# Patient Record
Sex: Female | Born: 1970 | Race: White | Hispanic: No | Smoking: Current every day smoker
Health system: Southern US, Community
[De-identification: ages and names within clinical notes are randomized; demographics above are authoritative.]

## PROBLEM LIST (undated history)

## (undated) ENCOUNTER — Ambulatory Visit: Admission: EM

## (undated) DIAGNOSIS — J449 Chronic obstructive pulmonary disease, unspecified: Secondary | ICD-10-CM

## (undated) DIAGNOSIS — K219 Gastro-esophageal reflux disease without esophagitis: Secondary | ICD-10-CM

## (undated) DIAGNOSIS — K5792 Diverticulitis of intestine, part unspecified, without perforation or abscess without bleeding: Secondary | ICD-10-CM

## (undated) DIAGNOSIS — Z9889 Other specified postprocedural states: Secondary | ICD-10-CM

## (undated) DIAGNOSIS — R112 Nausea with vomiting, unspecified: Secondary | ICD-10-CM

## (undated) HISTORY — PX: COLONOSCOPY: SHX174

## (undated) HISTORY — PX: TUBAL LIGATION: SHX77

---

## 2002-11-01 ENCOUNTER — Encounter: Payer: Self-pay | Admitting: Family Medicine

## 2002-11-01 ENCOUNTER — Ambulatory Visit (HOSPITAL_COMMUNITY): Admission: RE | Admit: 2002-11-01 | Discharge: 2002-11-01 | Payer: Self-pay | Admitting: Family Medicine

## 2004-03-03 ENCOUNTER — Inpatient Hospital Stay (HOSPITAL_COMMUNITY): Admission: EM | Admit: 2004-03-03 | Discharge: 2004-03-07 | Payer: Self-pay | Admitting: Emergency Medicine

## 2004-03-09 ENCOUNTER — Ambulatory Visit (HOSPITAL_COMMUNITY): Admission: RE | Admit: 2004-03-09 | Discharge: 2004-03-09 | Payer: Self-pay | Admitting: Family Medicine

## 2004-03-26 ENCOUNTER — Ambulatory Visit (HOSPITAL_COMMUNITY): Admission: RE | Admit: 2004-03-26 | Discharge: 2004-03-26 | Payer: Self-pay | Admitting: Internal Medicine

## 2004-11-22 ENCOUNTER — Ambulatory Visit (HOSPITAL_COMMUNITY): Admission: RE | Admit: 2004-11-22 | Discharge: 2004-11-22 | Payer: Self-pay | Admitting: Family Medicine

## 2007-12-16 ENCOUNTER — Other Ambulatory Visit: Admission: RE | Admit: 2007-12-16 | Discharge: 2007-12-16 | Payer: Self-pay | Admitting: Obstetrics and Gynecology

## 2007-12-20 ENCOUNTER — Ambulatory Visit (HOSPITAL_COMMUNITY): Admission: RE | Admit: 2007-12-20 | Discharge: 2007-12-20 | Payer: Self-pay | Admitting: Obstetrics & Gynecology

## 2010-05-31 ENCOUNTER — Emergency Department (HOSPITAL_COMMUNITY): Admission: EM | Admit: 2010-05-31 | Discharge: 2010-05-31 | Payer: Self-pay | Admitting: Emergency Medicine

## 2010-09-15 ENCOUNTER — Encounter: Payer: Self-pay | Admitting: Family Medicine

## 2010-11-07 LAB — COMPREHENSIVE METABOLIC PANEL
Albumin: 3.8 g/dL (ref 3.5–5.2)
BUN: 4 mg/dL — ABNORMAL LOW (ref 6–23)
CO2: 23 mEq/L (ref 19–32)
Chloride: 107 mEq/L (ref 96–112)
Glucose, Bld: 120 mg/dL — ABNORMAL HIGH (ref 70–99)
Potassium: 3.5 mEq/L (ref 3.5–5.1)
Sodium: 137 mEq/L (ref 135–145)
Total Protein: 6.8 g/dL (ref 6.0–8.3)

## 2010-11-07 LAB — CBC
Hemoglobin: 13.5 g/dL (ref 12.0–15.0)
MCH: 31.9 pg (ref 26.0–34.0)
MCHC: 34.2 g/dL (ref 30.0–36.0)
MCV: 93.2 fL (ref 78.0–100.0)
Platelets: 205 10*3/uL (ref 150–400)

## 2010-11-07 LAB — URINALYSIS, ROUTINE W REFLEX MICROSCOPIC
Ketones, ur: NEGATIVE mg/dL
Leukocytes, UA: NEGATIVE
Specific Gravity, Urine: 1.02 (ref 1.005–1.030)

## 2010-11-07 LAB — DIFFERENTIAL
Eosinophils Absolute: 0.2 10*3/uL (ref 0.0–0.7)
Eosinophils Relative: 2 % (ref 0–5)
Lymphocytes Relative: 18 % (ref 12–46)
Monocytes Relative: 5 % (ref 3–12)
Neutrophils Relative %: 74 % (ref 43–77)

## 2011-03-01 ENCOUNTER — Emergency Department (HOSPITAL_COMMUNITY)
Admission: EM | Admit: 2011-03-01 | Discharge: 2011-03-01 | Discharge status: Against Medical Advice | Payer: BC Managed Care – PPO | Attending: Emergency Medicine | Admitting: Emergency Medicine

## 2011-03-01 ENCOUNTER — Encounter: Payer: Self-pay | Admitting: *Deleted

## 2011-03-01 DIAGNOSIS — R109 Unspecified abdominal pain: Secondary | ICD-10-CM | POA: Insufficient documentation

## 2011-03-01 HISTORY — DX: Diverticulitis of intestine, part unspecified, without perforation or abscess without bleeding: K57.92

## 2011-05-15 ENCOUNTER — Other Ambulatory Visit: Payer: Self-pay | Admitting: Family Medicine

## 2011-05-15 ENCOUNTER — Ambulatory Visit (HOSPITAL_COMMUNITY)
Admission: RE | Admit: 2011-05-15 | Discharge: 2011-05-15 | Disposition: A | Discharge status: Home / Self Care | Payer: BC Managed Care – PPO | Source: Ambulatory Visit | Attending: Family Medicine | Admitting: Family Medicine

## 2011-05-15 DIAGNOSIS — M25551 Pain in right hip: Secondary | ICD-10-CM

## 2011-05-15 DIAGNOSIS — M25559 Pain in unspecified hip: Secondary | ICD-10-CM | POA: Insufficient documentation

## 2011-07-10 ENCOUNTER — Ambulatory Visit: Payer: BC Managed Care – PPO | Admitting: Orthopedic Surgery

## 2012-08-30 ENCOUNTER — Other Ambulatory Visit: Payer: Self-pay | Admitting: Family Medicine

## 2012-08-30 DIAGNOSIS — R109 Unspecified abdominal pain: Secondary | ICD-10-CM

## 2012-09-02 ENCOUNTER — Emergency Department (HOSPITAL_COMMUNITY)
Admission: EM | Admit: 2012-09-02 | Discharge: 2012-09-03 | Disposition: A | Discharge status: Home / Self Care | Payer: BC Managed Care – PPO | Attending: Emergency Medicine | Admitting: Emergency Medicine

## 2012-09-02 ENCOUNTER — Encounter (HOSPITAL_COMMUNITY): Payer: Self-pay | Admitting: *Deleted

## 2012-09-02 ENCOUNTER — Ambulatory Visit (HOSPITAL_COMMUNITY)
Admission: RE | Admit: 2012-09-02 | Discharge: 2012-09-02 | Disposition: A | Discharge status: Home / Self Care | Payer: BC Managed Care – PPO | Source: Ambulatory Visit | Attending: Family Medicine | Admitting: Family Medicine

## 2012-09-02 ENCOUNTER — Other Ambulatory Visit: Payer: Self-pay | Admitting: Family Medicine

## 2012-09-02 DIAGNOSIS — Z8719 Personal history of other diseases of the digestive system: Secondary | ICD-10-CM | POA: Insufficient documentation

## 2012-09-02 DIAGNOSIS — R110 Nausea: Secondary | ICD-10-CM | POA: Insufficient documentation

## 2012-09-02 DIAGNOSIS — R109 Unspecified abdominal pain: Secondary | ICD-10-CM

## 2012-09-02 DIAGNOSIS — R1011 Right upper quadrant pain: Secondary | ICD-10-CM | POA: Insufficient documentation

## 2012-09-02 DIAGNOSIS — F17200 Nicotine dependence, unspecified, uncomplicated: Secondary | ICD-10-CM | POA: Insufficient documentation

## 2012-09-02 DIAGNOSIS — K8020 Calculus of gallbladder without cholecystitis without obstruction: Secondary | ICD-10-CM | POA: Insufficient documentation

## 2012-09-02 DIAGNOSIS — K829 Disease of gallbladder, unspecified: Secondary | ICD-10-CM

## 2012-09-02 MED ORDER — ONDANSETRON HCL 4 MG/2ML IJ SOLN
4.0000 mg | Freq: Once | INTRAMUSCULAR | Status: AC
  Administered 2012-09-03: 4 mg via INTRAVENOUS
  Filled 2012-09-02: qty 2

## 2012-09-02 MED ORDER — HYDROMORPHONE HCL PF 1 MG/ML IJ SOLN
1.0000 mg | Freq: Once | INTRAMUSCULAR | Status: AC
  Administered 2012-09-03: 1 mg via INTRAVENOUS
  Filled 2012-09-02: qty 1

## 2012-09-02 MED ORDER — SODIUM CHLORIDE 0.9 % IV SOLN
Freq: Once | INTRAVENOUS | Status: AC
  Administered 2012-09-03: via INTRAVENOUS

## 2012-09-02 NOTE — ED Provider Notes (Signed)
History   This chart was scribed for [employer]. Colon Branch, MD, by Frederik Pear, ER scribe. The patient was seen in room APA09/APA09 and the patient's care was started at [address].    CSN: [id number]  Arrival date & time 09/02/12  2235   First MD Initiated Contact with Patient 09/02/12 2344      Chief Complaint  Patient presents with  . Abdominal Pain    (Consider location/radiation/quality/duration/timing/severity/associated sxs/prior treatment) HPI  Brandy Hensley is a 42 y.o. female who presents to the Emergency Department complaining of constant, moderate abdominal pain with associated nausea that began around 18:00. She denies any emesis. She states that took Gas-Ex, extra Fish farm manager and Protonix with no relief. She reports that she had a gallbladder study ordered today by Dr. Lubertha South.   Past Medical History  Diagnosis Date  . Diverticulitis     Past Surgical History  Procedure Date  . Tubal ligation     History reviewed. No pertinent family history.  History  Substance Use Topics  . Smoking status: Current Every Day Smoker -- 1.0 packs/day  . Smokeless tobacco: Not on file  . Alcohol Use: No    OB History    Grav Para Term Preterm Abortions TAB SAB Ect Mult Living                  Review of Systems A complete 10 system review of systems was obtained and all systems are negative except as noted in the HPI and PMH.  Allergies  Codeine  Home Medications   Current Outpatient Rx  Name  Route  Sig  Dispense  Refill  . FLEXERIL PO   Oral   Take by mouth.           Marland Kitchen PROTONIX PO   Oral   Take by mouth.             BP 143/75  Pulse 82  Temp 97.5 F (36.4 C) (Oral)  Resp 20  Ht 5\' 8"  (1.727 m)  Wt 230 lb (104.327 kg)  BMI 34.97 kg/m2  SpO2 100%  LMP 08/28/2012  Physical Exam  Nursing note and vitals reviewed. Constitutional: She appears well-developed and well-nourished.  HENT:  Head: Normocephalic and atraumatic.  Eyes: Pupils  are equal, round, and reactive to light.  Neck: Normal range of motion. Neck supple.  Pulmonary/Chest: Effort normal. No respiratory distress.  Abdominal: There is tenderness.       She has focal RUQ tenderness with a positive Murphy's sign, but no rebound or guarding.  Musculoskeletal: Normal range of motion.  Neurological: She is alert.  Skin: Skin is warm and dry.  Psychiatric: She has a normal mood and affect. Thought content normal.    ED Course  Procedures (including critical care time)  DIAGNOSTIC STUDIES: Oxygen Saturation is 100% on room air, normal by my interpretation.    COORDINATION OF CARE:  23:56- Discussed planned course of treatment with the patient, including pain medication and blood work, who is agreeable at this time. Results for orders placed during the hospital encounter of 09/02/12  CBC WITH DIFFERENTIAL      Component Value Range   WBC 11.0 (*) 4.0 - 10.5 K/uL   RBC 4.10  3.87 - 5.11 MIL/uL   Hemoglobin 11.4 (*) 12.0 - 15.0 g/dL   HCT 47.8 (*) 29.5 - 62.1 %   MCV 85.4  78.0 - 100.0 fL   MCH 27.8  26.0 - 34.0 pg  MCHC 32.6  30.0 - 36.0 g/dL   RDW 21.3  08.6 - 57.8 %   Platelets 278  150 - 400 K/uL   Neutrophils Relative 58  43 - 77 %   Neutro Abs 6.4  1.7 - 7.7 K/uL   Lymphocytes Relative 34  12 - 46 %   Lymphs Abs 3.8  0.7 - 4.0 K/uL   Monocytes Relative 6  3 - 12 %   Monocytes Absolute 0.6  0.1 - 1.0 K/uL   Eosinophils Relative 2  0 - 5 %   Eosinophils Absolute 0.2  0.0 - 0.7 K/uL   Basophils Relative 1  0 - 1 %   Basophils Absolute 0.1  0.0 - 0.1 K/uL  COMPREHENSIVE METABOLIC PANEL      Component Value Range   Sodium 137  135 - 145 mEq/L   Potassium 3.2 (*) 3.5 - 5.1 mEq/L   Chloride 101  96 - 112 mEq/L   CO2 26  19 - 32 mEq/L   Glucose, Bld 98  70 - 99 mg/dL   BUN 11  6 - 23 mg/dL   Creatinine, Ser 4.69  0.50 - 1.10 mg/dL   Calcium 9.1  8.4 - 62.9 mg/dL   Total Protein 7.4  6.0 - 8.3 g/dL   Albumin 3.8  3.5 - 5.2 g/dL   AST 12  0 - 37  U/L   ALT 6  0 - 35 U/L   Alkaline Phosphatase 79  39 - 117 U/L   Total Bilirubin 0.4  0.3 - 1.2 mg/dL   GFR calc non Af Amer 83 (*) >90 mL/min   GFR calc Af Amer >90  >90 mL/min    US Abdomen Limited Ruq  09/02/2012  *RADIOLOGY REPORT*  Clinical Data:  Right upper quadrant abdominal pain  LIMITED ABDOMINAL ULTRASOUND - RIGHT UPPER QUADRANT  Comparison:  CT abdomen pelvis of 11/22/2004  Findings:  Gallbladder:  There are multiple gallstones within the gallbladder with acoustical shadowing, the largest measuring 1.8 cm in diameter.  No pain is present over the gallbladder with compression.  Common bile duct:  The common bile duct is normal measuring 6 mm in diameter.  Liver:  The liver is prominent and echogenic consistent with fatty infiltration.  No ductal dilatation is seen.  IMPRESSION:  1.  Multiple gallstones within the gallbladder.  No ultrasound evidence of acute cholecystitis. 2.  Diffusely echogenic liver consistent with fatty infiltration.                    Original Report Authenticated By: Dwyane Dee, M.D.         MDM  Patient with RUQ pain and [place] evidence of gall stones. Does not have acute cholecystitis at this time. Given antiemetic and analgesic with relief. Replaced potassium which was a little low.  Given referral to surgeon.  Pt feels improved after observation and/or treatment in ED.Pt stable in ED with no significant deterioration in condition.The patient appears reasonably screened and/or stabilized for discharge and I doubt any other medical condition or other [facility] requiring further screening, evaluation, or treatment in the ED at this time prior to discharge.  I personally performed the services described in this documentation, which was scribed in my presence. The recorded information has been reviewed and considered.   MDM Reviewed: nursing note and vitals Reviewed previous: ultrasound Interpretation: labs           [employer]. Colon Branch, MD 09/03/12 [id number]

## 2012-09-02 NOTE — ED Notes (Signed)
Upper abd pain, onset 6 pm.  Had GB US done today , but does not know results. Nausea, no vomting.

## 2012-09-03 LAB — COMPREHENSIVE METABOLIC PANEL
ALT: 6 U/L (ref 0–35)
Albumin: 3.8 g/dL (ref 3.5–5.2)
Alkaline Phosphatase: 79 U/L (ref 39–117)
BUN: 11 mg/dL (ref 6–23)
GFR calc Af Amer: >90 mL/min (ref >90)
GFR calc non Af Amer: 83 mL/min — ABNORMAL LOW (ref >90)
Sodium: 137 mEq/L (ref 135–145)
Total Protein: 7.4 g/dL (ref 6.0–8.3)

## 2012-09-03 LAB — CBC WITH DIFFERENTIAL/PLATELET
Basophils Absolute: 0.1 10*3/uL (ref 0.0–0.1)
Eosinophils Relative: 2 % (ref 0–5)
Lymphocytes Relative: 34 % (ref 12–46)
MCH: 27.8 pg (ref 26.0–34.0)
MCHC: 32.6 g/dL (ref 30.0–36.0)
MCV: 85.4 fL (ref 78.0–100.0)
Neutro Abs: 6.4 10*3/uL (ref 1.7–7.7)
Neutrophils Relative %: 58 % (ref 43–77)
Platelets: 278 10*3/uL (ref 150–400)
RBC: 4.1 MIL/uL (ref 3.87–5.11)
WBC: 11 10*3/uL — ABNORMAL HIGH (ref 4.0–10.5)

## 2012-09-03 MED ORDER — ONDANSETRON HCL 4 MG PO TABS: 4.0000 mg | ORAL_TABLET | Freq: Four times a day (QID) | ORAL | Status: DC

## 2012-09-03 MED ORDER — POTASSIUM CHLORIDE CRYS ER 20 MEQ PO TBCR
60.0000 meq | EXTENDED_RELEASE_TABLET | Freq: Once | ORAL | Status: DC
  Filled 2012-09-03: qty 3

## 2012-09-03 MED ORDER — HYDROCODONE-ACETAMINOPHEN 5-325 MG PO TABS: 1.0000 | ORAL_TABLET | ORAL | Status: DC | PRN

## 2012-09-07 NOTE — H&P (Signed)
  NTS SOAP Note  Vital Signs:  Vitals as of: 09/07/2012: Systolic 125: Diastolic 78: Heart Rate 95: Temp 98.57F: Height 21ft 7in: Weight 235Lbs 0 Ounces: BMI 37  BMI : 36.81 kg/m2  Subjective: This 42 Years 68 Months old Female presents for of    ABDOMINAL ISSUES: ,Has had intermittent epigastric pain, nausea, vomiting, and fatty food intolerance for many months now.  Seen in ER on 09/02/12.  U/S shows cholelithiasis.  Potassium level 3.2, lft's wnl.  No fever, chills, jaundice.  Review of Symptoms:  Constitutional:unremarkable   Head:unremarkable    Eyes:unremarkable   Nose/Mouth/Throat:unremarkable Cardiovascular:  unremarkable   Respiratory:unremarkable   Gastrointestin    abdominal pain,nausea,vomiting,heartburn Genitourinary:unremarkable       joint pain Skin:unremarkable Hematolgic/Lymphatic:unremarkable     Allergic/Immunologic:unremarkable     Past Medical History:    Reviewed   Past Medical History  Surgical History: BTL Allergies: codeine Medications: protonix, flexeril   Social History:Reviewed  Social History  Preferred Language: English Ethnicity: Not Hispanic / Latino Age: 15 Years 6 Months Marital Status:  L Alcohol:  No Recreational drug(s):  No   Smoking Status: Current every day smoker reviewed on 09/07/2012 Started Date: 08/26/1991 Packs per day: 1.00 Functional Status reviewed on mm/dd/yyyy ------------------------------------------------ Bathing: Normal Cooking: Normal Dressing: Normal Driving: Normal Eating: Normal Managing Meds: Normal Oral Care: Normal Shopping: Normal Toileting: Normal Transferring: Normal Walking: Normal Cognitive Status reviewed on mm/dd/yyyy ------------------------------------------------ Attention: Normal Decision Making: Normal Language: Normal Memory: Normal Motor: Normal Perception: Normal Problem Solving: Normal Visual and Spatial: Normal   Family  History:  Reviewed   Family History              Father:  Cancer    Objective Information: General:  Well appearing, well nourished in no distress.   no scleral icterus Neck:  Supple without lymphadenopathy.  Heart:  RRR, no murmur Lungs:    CTA bilaterally, no wheezes, rhonchi, rales.  Breathing unlabored. Abdomen:Soft, NT/ND, no HSM, no masses.  Assessment:Biliary colic, cholelithiasis  Diagnosis &amp; Procedure:    Plan:Scheduled for laparoscopic cholecystectomy on 09/10/12.   Patient Education:Alternative treatments to surgery were discussed with patient (and family).  Risks and benefits  of procedure including bleeding, infection, hepatobiliary injury, and the possibility of an open procedure were fully explained to the patient (and family) who gave informed consent. Patient/family questions were addressed.  Potassium supplements prescribed.  Follow-up:Pending Surgery

## 2012-09-08 ENCOUNTER — Encounter (HOSPITAL_COMMUNITY): Payer: Self-pay

## 2012-09-08 ENCOUNTER — Encounter (HOSPITAL_COMMUNITY)
Admission: RE | Admit: 2012-09-08 | Discharge: 2012-09-08 | Disposition: A | Discharge status: Home / Self Care | Payer: BC Managed Care – PPO | Source: Ambulatory Visit | Attending: General Surgery | Admitting: General Surgery

## 2012-09-08 ENCOUNTER — Encounter (HOSPITAL_COMMUNITY): Payer: Self-pay | Admitting: Pharmacy Technician

## 2012-09-08 HISTORY — DX: Other specified postprocedural states: R11.2

## 2012-09-08 HISTORY — DX: Gastro-esophageal reflux disease without esophagitis: K21.9

## 2012-09-08 HISTORY — DX: Nausea with vomiting, unspecified: R11.2

## 2012-09-08 HISTORY — DX: Nausea with vomiting, unspecified: Z98.890

## 2012-09-08 LAB — SURGICAL PCR SCREEN: Staphylococcus aureus: NEGATIVE

## 2012-09-08 NOTE — Patient Instructions (Addendum)
SHATARRA WEHLING  09/08/2012   Your procedure is scheduled on:   09/10/2012  Report to [facility] at  730  AM.  Call this number if you have problems the morning of surgery: [phone]   Remember:   Do not eat food or drink liquids after midnight.   Take these medicines the morning of surgery with A SIP OF WATER: flexaril,norco,protonix,zofran   Do not wear jewelry, make-up or nail polish.  Do not wear lotions, powders, or perfumes.  Do not shave 48 hours prior to surgery. Men may shave face and neck.  Do not bring valuables to the hospital.  Contacts, dentures or bridgework may not be worn into surgery.  Leave suitcase in the car. After surgery it may be brought to your room.  For patients admitted to the hospital, checkout time is 11:00 AM the day of discharge.   Patients discharged the day of surgery will not be allowed to drive home.  Name and phone number of your driver: family  Special Instructions: Shower using CHG 2 nights before surgery and the night before surgery.  If you shower the day of surgery use CHG.  Use special wash - you have one bottle of CHG for all showers.  You should use approximately 1/3 of the bottle for each shower.   Please read over the following fact sheets that you were given: Pain Booklet, Coughing and Deep Breathing, MRSA Information and Surgical Site Infection Prevention Laparoscopic Cholecystectomy Laparoscopic cholecystectomy is surgery to remove the gallbladder. The gallbladder is located slightly to the right of center in the abdomen, behind the liver. It is a concentrating and storage sac for the bile produced in the liver. Bile aids in the digestion and absorption of fats. Gallbladder disease (cholecystitis) is an inflammation of your gallbladder. This condition is usually caused by a buildup of gallstones (cholelithiasis) in your gallbladder. Gallstones can block the flow of bile, resulting in inflammation and pain. In severe cases, emergency  surgery may be required. When emergency surgery is not required, you will have time to prepare for the procedure. Laparoscopic surgery is an alternative to open surgery. Laparoscopic surgery usually has a shorter recovery time. Your common bile duct may also need to be examined and explored. Your caregiver will discuss this with you if he or she feels this should be done. If stones are found in the common bile duct, they may be removed. LET YOUR CAREGIVER KNOW ABOUT:  Allergies to food or medicine.  Medicines taken, including vitamins, herbs, eyedrops, over-the-counter medicines, and creams.  Use of steroids (by mouth or creams).  Previous problems with anesthetics or numbing medicines.  History of bleeding problems or blood clots.  Previous surgery.  Other health problems, including diabetes and kidney problems.  Possibility of pregnancy, if this applies. RISKS AND COMPLICATIONS All surgery is associated with risks. Some problems that may occur following this procedure include:  Infection.  Damage to the common bile duct, nerves, arteries, veins, or other internal organs such as the stomach or intestines.  Bleeding.  A stone may remain in the common bile duct. BEFORE THE PROCEDURE  Do not take aspirin for 3 days prior to surgery or blood thinners for 1 week prior to surgery.  Do not eat or drink anything after midnight the night before surgery.  Let your caregiver know if you develop a cold or other infectious problem prior to surgery.  You should be present 60 minutes before the procedure  or as directed. PROCEDURE  You will be given medicine that makes you sleep (general anesthetic). When you are asleep, your surgeon will make several small cuts (incisions) in your abdomen. One of these incisions is used to insert a small, lighted scope (laparoscope) into the abdomen. The laparoscope helps the surgeon see into your abdomen. Carbon dioxide gas will be pumped into your abdomen.  The gas allows more room for the surgeon to perform your surgery. Other operating instruments are inserted through the other incisions. Laparoscopic procedures may not be appropriate when:  There is major scarring from previous surgery.  The gallbladder is extremely inflamed.  There are bleeding disorders or unexpected cirrhosis of the liver.  A pregnancy is near term.  Other conditions make the laparoscopic procedure impossible. If your surgeon feels it is not safe to continue with a laparoscopic procedure, he or she will perform an open abdominal procedure. In this case, the surgeon will make an incision to open the abdomen. This gives the surgeon a larger view and field to work within. This may allow the surgeon to perform procedures that sometimes cannot be performed with a laparoscope alone. Open surgery has a longer recovery time. AFTER THE PROCEDURE  You will be taken to the recovery area where a nurse will watch and check your progress.  You may be allowed to go home the same day.  Do not resume physical activities until directed by your caregiver.  You may resume a normal diet and activities as directed. Document Released: 08/11/2005 Document Revised: 11/03/2011 Document Reviewed: 01/24/2011 [facility] Patient Information 2013 [place], [place]. PATIENT INSTRUCTIONS POST-ANESTHESIA  IMMEDIATELY FOLLOWING SURGERY:  Do not drive or operate machinery for the first twenty four hours after surgery.  Do not make any important decisions for twenty four hours after surgery or while taking narcotic pain medications or sedatives.  If you develop intractable nausea and vomiting or a severe headache please notify your doctor immediately.  FOLLOW-UP:  Please make an appointment with your surgeon as instructed. You do not need to follow up with anesthesia unless specifically instructed to do so.  WOUND CARE INSTRUCTIONS (if applicable):  Keep a dry clean dressing on the anesthesia/puncture  wound site if there is drainage.  Once the wound has quit draining you may leave it open to air.  Generally you should leave the bandage intact for twenty four hours unless there is drainage.  If the epidural site drains for more than 36-48 hours please call the anesthesia department.  QUESTIONS?:  Please feel free to call your physician or the hospital operator if you have any questions, and they will be happy to assist you.

## 2012-09-09 NOTE — Pre-Procedure Instructions (Signed)
Patient on potassium supplements. Will recheck potassium on arival 09/10/2012, per Dr Jayme Cloud.

## 2012-09-10 ENCOUNTER — Encounter (HOSPITAL_COMMUNITY): Payer: Self-pay | Admitting: Anesthesiology

## 2012-09-10 ENCOUNTER — Encounter (HOSPITAL_COMMUNITY)
Admission: RE | Disposition: A | Discharge status: Home / Self Care | Payer: Self-pay | Source: Ambulatory Visit | Attending: General Surgery

## 2012-09-10 ENCOUNTER — Ambulatory Visit (HOSPITAL_COMMUNITY): Payer: BC Managed Care – PPO | Admitting: Anesthesiology

## 2012-09-10 ENCOUNTER — Encounter (HOSPITAL_COMMUNITY): Payer: Self-pay | Admitting: *Deleted

## 2012-09-10 ENCOUNTER — Ambulatory Visit (HOSPITAL_COMMUNITY)
Admission: RE | Admit: 2012-09-10 | Discharge: 2012-09-10 | Disposition: A | Discharge status: Home / Self Care | Payer: BC Managed Care – PPO | Source: Ambulatory Visit | Attending: General Surgery | Admitting: General Surgery

## 2012-09-10 DIAGNOSIS — Z01812 Encounter for preprocedural laboratory examination: Secondary | ICD-10-CM | POA: Insufficient documentation

## 2012-09-10 DIAGNOSIS — K8010 Calculus of gallbladder with chronic cholecystitis without obstruction: Secondary | ICD-10-CM | POA: Insufficient documentation

## 2012-09-10 HISTORY — PX: CHOLECYSTECTOMY: SHX55

## 2012-09-10 SURGERY — LAPAROSCOPIC CHOLECYSTECTOMY
Anesthesia: General | Site: Abdomen | Wound class: Contaminated

## 2012-09-10 MED ORDER — KETOROLAC TROMETHAMINE 30 MG/ML IJ SOLN
INTRAMUSCULAR | Status: AC
  Filled 2012-09-10: qty 1

## 2012-09-10 MED ORDER — SCOPOLAMINE 1 MG/3DAYS TD PT72
1.0000 | MEDICATED_PATCH | Freq: Once | TRANSDERMAL | Status: DC
  Administered 2012-09-10: 1.5 mg via TRANSDERMAL

## 2012-09-10 MED ORDER — BUPIVACAINE HCL (PF) 0.5 % IJ SOLN
INTRAMUSCULAR | Status: AC
  Filled 2012-09-10: qty 30

## 2012-09-10 MED ORDER — DEXAMETHASONE SODIUM PHOSPHATE 4 MG/ML IJ SOLN
4.0000 mg | Freq: Once | INTRAMUSCULAR | Status: AC
  Administered 2012-09-10: 4 mg via INTRAVENOUS

## 2012-09-10 MED ORDER — KETOROLAC TROMETHAMINE 30 MG/ML IJ SOLN
30.0000 mg | Freq: Once | INTRAMUSCULAR | Status: AC
  Administered 2012-09-10: 30 mg via INTRAVENOUS

## 2012-09-10 MED ORDER — ONDANSETRON HCL 4 MG/2ML IJ SOLN
INTRAMUSCULAR | Status: AC
  Filled 2012-09-10: qty 2

## 2012-09-10 MED ORDER — ONDANSETRON HCL 4 MG/2ML IJ SOLN
4.0000 mg | Freq: Once | INTRAMUSCULAR | Status: AC
Start: 2012-09-10 — End: 2012-09-10
  Administered 2012-09-10: 4 mg via INTRAVENOUS

## 2012-09-10 MED ORDER — ROCURONIUM BROMIDE 100 MG/10ML IV SOLN
INTRAVENOUS | Status: DC | PRN
  Administered 2012-09-10: 30 mg via INTRAVENOUS

## 2012-09-10 MED ORDER — LACTATED RINGERS IV SOLN
INTRAVENOUS | Status: DC
  Administered 2012-09-10 (×2): via INTRAVENOUS

## 2012-09-10 MED ORDER — NEOSTIGMINE METHYLSULFATE 1 MG/ML IJ SOLN
INTRAMUSCULAR | Status: DC | PRN
  Administered 2012-09-10: 2 mg via INTRAVENOUS

## 2012-09-10 MED ORDER — CEFOTETAN DISODIUM-DEXTROSE 2-2.08 GM-% IV SOLR
2.0000 g | Freq: Once | INTRAVENOUS | Status: AC
  Administered 2012-09-10: 2 g via INTRAVENOUS

## 2012-09-10 MED ORDER — PROPOFOL 10 MG/ML IV EMUL
INTRAVENOUS | Status: AC
  Filled 2012-09-10: qty 20

## 2012-09-10 MED ORDER — FENTANYL CITRATE 0.05 MG/ML IJ SOLN
25.0000 ug | INTRAMUSCULAR | Status: DC | PRN
  Administered 2012-09-10 (×4): 25 ug via INTRAVENOUS

## 2012-09-10 MED ORDER — BUPIVACAINE HCL (PF) 0.5 % IJ SOLN
INTRAMUSCULAR | Status: DC | PRN
  Administered 2012-09-10: 10 mL

## 2012-09-10 MED ORDER — PROPOFOL 10 MG/ML IV BOLUS
INTRAVENOUS | Status: DC | PRN
  Administered 2012-09-10: 150 mg via INTRAVENOUS

## 2012-09-10 MED ORDER — GLYCOPYRROLATE 0.2 MG/ML IJ SOLN
INTRAMUSCULAR | Status: DC | PRN
  Administered 2012-09-10: 0.4 mg via INTRAVENOUS

## 2012-09-10 MED ORDER — SCOPOLAMINE 1 MG/3DAYS TD PT72
MEDICATED_PATCH | TRANSDERMAL | Status: AC
  Filled 2012-09-10: qty 1

## 2012-09-10 MED ORDER — GLYCOPYRROLATE 0.2 MG/ML IJ SOLN
0.2000 mg | Freq: Once | INTRAMUSCULAR | Status: AC
  Administered 2012-09-10: 0.2 mg via INTRAVENOUS

## 2012-09-10 MED ORDER — MIDAZOLAM HCL 2 MG/2ML IJ SOLN
INTRAMUSCULAR | Status: AC
  Filled 2012-09-10: qty 2

## 2012-09-10 MED ORDER — CHLORHEXIDINE GLUCONATE 4 % EX LIQD: 1.0000 "application " | Freq: Once | CUTANEOUS | Status: DC

## 2012-09-10 MED ORDER — FENTANYL CITRATE 0.05 MG/ML IJ SOLN
INTRAMUSCULAR | Status: AC
  Filled 2012-09-10: qty 5

## 2012-09-10 MED ORDER — DEXAMETHASONE SODIUM PHOSPHATE 4 MG/ML IJ SOLN
INTRAMUSCULAR | Status: AC
  Filled 2012-09-10: qty 1

## 2012-09-10 MED ORDER — ROCURONIUM BROMIDE 50 MG/5ML IV SOLN
INTRAVENOUS | Status: AC
  Filled 2012-09-10: qty 1

## 2012-09-10 MED ORDER — SODIUM CHLORIDE 0.9 % IR SOLN
Status: DC | PRN
  Administered 2012-09-10: 3000 mL

## 2012-09-10 MED ORDER — FENTANYL CITRATE 0.05 MG/ML IJ SOLN
INTRAMUSCULAR | Status: DC | PRN
  Administered 2012-09-10: 50 ug via INTRAVENOUS
  Administered 2012-09-10 (×2): 25 ug via INTRAVENOUS
  Administered 2012-09-10: 100 ug via INTRAVENOUS

## 2012-09-10 MED ORDER — ENOXAPARIN SODIUM 40 MG/0.4ML ~~LOC~~ SOLN
40.0000 mg | Freq: Once | SUBCUTANEOUS | Status: AC
  Administered 2012-09-10: 40 mg via SUBCUTANEOUS

## 2012-09-10 MED ORDER — ENOXAPARIN SODIUM 40 MG/0.4ML ~~LOC~~ SOLN
SUBCUTANEOUS | Status: AC
  Filled 2012-09-10: qty 0.4

## 2012-09-10 MED ORDER — HYDROCODONE-ACETAMINOPHEN 5-325 MG PO TABS: 1.0000 | ORAL_TABLET | ORAL | Status: AC | PRN

## 2012-09-10 MED ORDER — HEMOSTATIC AGENTS (NO CHARGE) OPTIME
TOPICAL | Status: DC | PRN
  Administered 2012-09-10: 1 via TOPICAL

## 2012-09-10 MED ORDER — GLYCOPYRROLATE 0.2 MG/ML IJ SOLN
INTRAMUSCULAR | Status: AC
  Filled 2012-09-10: qty 1

## 2012-09-10 MED ORDER — LIDOCAINE HCL (CARDIAC) 10 MG/ML IV SOLN
INTRAVENOUS | Status: DC | PRN
  Administered 2012-09-10: 20 mg via INTRAVENOUS

## 2012-09-10 MED ORDER — LIDOCAINE HCL (PF) 1 % IJ SOLN
INTRAMUSCULAR | Status: AC
  Filled 2012-09-10: qty 5

## 2012-09-10 MED ORDER — GLYCOPYRROLATE 0.2 MG/ML IJ SOLN
INTRAMUSCULAR | Status: AC
  Filled 2012-09-10: qty 2

## 2012-09-10 MED ORDER — 0.9 % SODIUM CHLORIDE (POUR BTL) OPTIME
TOPICAL | Status: DC | PRN
  Administered 2012-09-10: 1000 mL

## 2012-09-10 MED ORDER — CEFOTETAN DISODIUM-DEXTROSE 2-2.08 GM-% IV SOLR
INTRAVENOUS | Status: AC
  Filled 2012-09-10: qty 50

## 2012-09-10 MED ORDER — ONDANSETRON HCL 4 MG/2ML IJ SOLN
4.0000 mg | Freq: Once | INTRAMUSCULAR | Status: AC | PRN
  Administered 2012-09-10: 4 mg via INTRAVENOUS

## 2012-09-10 MED ORDER — FENTANYL CITRATE 0.05 MG/ML IJ SOLN
INTRAMUSCULAR | Status: AC
  Filled 2012-09-10: qty 2

## 2012-09-10 MED ORDER — MIDAZOLAM HCL 2 MG/2ML IJ SOLN
1.0000 mg | INTRAMUSCULAR | Status: DC | PRN
  Administered 2012-09-10: 2 mg via INTRAVENOUS

## 2012-09-10 SURGICAL SUPPLY — 36 items
APPLIER CLIP LAPSCP 10X32 DD (CLIP) ×2 IMPLANT
BAG HAMPER (MISCELLANEOUS) ×2 IMPLANT
BAG SPEC RTRVL LRG 6X4 10 (ENDOMECHANICALS) ×1
CLOTH BEACON ORANGE TIMEOUT ST (SAFETY) ×2 IMPLANT
COVER LIGHT HANDLE STERIS (MISCELLANEOUS) ×4 IMPLANT
DECANTER SPIKE VIAL GLASS SM (MISCELLANEOUS) ×2 IMPLANT
DURAPREP 26ML APPLICATOR (WOUND CARE) ×2 IMPLANT
ELECT REM PT RETURN 9FT ADLT (ELECTROSURGICAL) ×2
ELECTRODE REM PT RTRN 9FT ADLT (ELECTROSURGICAL) ×1 IMPLANT
FILTER SMOKE EVAC LAPAROSHD (FILTER) ×2 IMPLANT
FORMALIN 10 PREFIL 120ML (MISCELLANEOUS) ×2 IMPLANT
GLOVE BIO SURGEON STRL SZ7.5 (GLOVE) ×2 IMPLANT
GLOVE BIOGEL PI IND STRL 7.0 (GLOVE) IMPLANT
GLOVE BIOGEL PI INDICATOR 7.0 (GLOVE) ×3
GLOVE ECLIPSE 6.5 STRL STRAW (GLOVE) ×2 IMPLANT
GLOVE EXAM NITRILE MD LF STRL (GLOVE) ×1 IMPLANT
GLOVE OPTIFIT SS 6.5 STRL BRWN (GLOVE) ×1 IMPLANT
GOWN STRL REIN XL XLG (GOWN DISPOSABLE) ×7 IMPLANT
HEMOSTAT SNOW SURGICEL 2X4 (HEMOSTASIS) ×2 IMPLANT
INST SET LAPROSCOPIC AP (KITS) ×2 IMPLANT
IV NS IRRIG 3000ML ARTHROMATIC (IV SOLUTION) ×1 IMPLANT
KIT ROOM TURNOVER APOR (KITS) ×2 IMPLANT
KIT TROCAR LAP CHOLE (TROCAR) ×2 IMPLANT
MANIFOLD NEPTUNE II (INSTRUMENTS) ×2 IMPLANT
NS IRRIG 1000ML POUR BTL (IV SOLUTION) ×2 IMPLANT
PACK LAP CHOLE LZT030E (CUSTOM PROCEDURE TRAY) ×2 IMPLANT
PAD ARMBOARD 7.5X6 YLW CONV (MISCELLANEOUS) ×2 IMPLANT
POUCH SPECIMEN RETRIEVAL 10MM (ENDOMECHANICALS) ×2 IMPLANT
SET BASIN LINEN APH (SET/KITS/TRAYS/PACK) ×2 IMPLANT
SET IRRIG TUBING LAPAROSCOPIC (IRRIGATION / IRRIGATOR) ×1 IMPLANT
SPONGE GAUZE 2X2 8PLY STRL LF (GAUZE/BANDAGES/DRESSINGS) ×5 IMPLANT
STAPLER VISISTAT (STAPLE) ×2 IMPLANT
SUT VICRYL 0 UR6 27IN ABS (SUTURE) ×2 IMPLANT
TAPE CLOTH SURG 4X10 WHT LF (GAUZE/BANDAGES/DRESSINGS) ×1 IMPLANT
WARMER LAPAROSCOPE (MISCELLANEOUS) ×2 IMPLANT
YANKAUER SUCT 12FT TUBE ARGYLE (SUCTIONS) ×2 IMPLANT

## 2012-09-10 NOTE — Anesthesia Postprocedure Evaluation (Signed)
Anesthesia Post Note  Patient: Brandy Hensley  Procedure(s) Performed: Procedure(s) (LRB): LAPAROSCOPIC CHOLECYSTECTOMY (N/A)  Anesthesia type: General  Patient location: PACU  Post pain: Pain level controlled  Post assessment: Post-op Vital signs reviewed, Patient's Cardiovascular Status Stable, Respiratory Function Stable, Patent Airway, No signs of Nausea or vomiting and Pain level controlled  Last Vitals:  Filed Vitals:   09/10/12 1038  BP: 131/73  Pulse: 104  Temp: 36.8 C  Resp: 20    Post vital signs: Reviewed and stable  Level of consciousness: awake and alert   Complications: No apparent anesthesia complications

## 2012-09-10 NOTE — Op Note (Signed)
Patient:  Brandy Hensley  DOB:  1970/10/25  MRN:  [id number]   Preop Diagnosis:  Biliary colic, cholelithiasis  Postop Diagnosis:  Same  Procedure:  Laparoscopic cholecystectomy  Surgeon:  Franky Macho, M.D.  Anes:  General endotracheal  Indications:  Patient is a 42 year old white female presents with biliary colic secondary to cholelithiasis. The risks and benefits of the procedure including bleeding, infection, hepatobiliary injury, and the possibility of an open procedure were fully explained to the patient, who gave informed consent.  Procedure note:  The patient was placed in the supine position. After induction of general endotracheal anesthesia, the abdomen was prepped and draped using usual sterile technique with DuraPrep. Surgical site confirmation was performed.  A supraumbilical incision was made down to the fascia. A Veress needle was introduced into the abdominal cavity and confirmation of placement was done using the saline drop test. The abdomen was then insufflated to 16 mm mercury pressure. An 11 mm trocar was introduced into the abdominal cavity under direct visualization without difficulty. The patient was placed in reverse Trendelenburg position and additional 11 mm trocar was placed the epigastric region and 5 mm trochars were placed the right upper quadrant and right flank regions. The liver was inspected and noted within normal limits. The gallbladder was retracted in a dynamic fashion. This exposed the triangle of Calot. The cystic duct was first identified. Its juncture to the infundibulum was fully identified. Endoclips were placed proximally and distally on the cystic duct, and the cystic duct was divided. This is likewise done to the cystic artery. The gallbladder was then freed away from the gallbladder fossa using Bovie electrocautery. The gallbladder was delivered through the epigastric trocar site using an Endo Catch bag. The gallbladder fossa was inspected and no  abnormal bleeding or bile leakage was noted. Surgicel is placed the gallbladder fossa. All fluid and air were then evacuated from the abdominal cavity prior to removal of the trochars.  All wounds were irrigated with normal saline. All wounds were injected with 0.5% Sensorcaine. The supraumbilical fascia as well as epigastric fascia were reapproximated using 0 Vicryl interrupted sutures. All skin incisions were closed using staples. He denied ointment and dry sterile dressings were applied.  All tape and needle counts were correct at the end of the procedure. Patient was extubated in the operating room and transferred to PACU in stable condition.  Complications:  None  EBL:  Minimal  Specimen:  Gallbladder

## 2012-09-10 NOTE — Anesthesia Preprocedure Evaluation (Signed)
Anesthesia Evaluation  Patient identified by MRN, date of birth, ID band Patient awake    Reviewed: Allergy & Precautions, H&P , NPO status , Patient's Chart, lab work & pertinent test results  History of Anesthesia Complications (+) PONVNegative for: history of anesthetic complications  Airway Mallampati: I TM Distance: >3 FB Neck ROM: Full    Dental  (+) Teeth Intact   Pulmonary Current Smoker,  breath sounds clear to auscultation        Cardiovascular negative cardio ROS  Rhythm:Regular Rate:Normal     Neuro/Psych    GI/Hepatic GERD-  Medicated and Controlled,  Endo/Other    Renal/GU      Musculoskeletal   Abdominal   Peds  Hematology   Anesthesia Other Findings   Reproductive/Obstetrics                           Anesthesia Physical Anesthesia Plan  ASA: II  Anesthesia Plan: General   Post-op Pain Management:    Induction: Intravenous, Rapid sequence and Cricoid pressure planned  Airway Management Planned: Oral ETT  Additional Equipment:   Intra-op Plan:   Post-operative Plan: Extubation in OR  Informed Consent: I have reviewed the patients History and Physical, chart, labs and discussed the procedure including the risks, benefits and alternatives for the proposed anesthesia with the patient or authorized representative who has indicated his/her understanding and acceptance.     Plan Discussed with:   Anesthesia Plan Comments:         Anesthesia Quick Evaluation

## 2012-09-10 NOTE — Transfer of Care (Signed)
Immediate Anesthesia Transfer of Care Note  Patient: Brandy Hensley  Procedure(s) Performed: Procedure(s) (LRB): LAPAROSCOPIC CHOLECYSTECTOMY (N/A)  Patient Location: PACU  Anesthesia Type: General  Level of Consciousness: awake  Airway & Oxygen Therapy: Patient Spontanous Breathing and non-rebreather face mask  Post-op Assessment: Report given to PACU RN, Post -op Vital signs reviewed and stable and Patient moving all extremities  Post vital signs: Reviewed and stable  Complications: No apparent anesthesia complications

## 2012-09-10 NOTE — Interval H&P Note (Signed)
History and Physical Interval Note:  09/10/2012 8:59 AM  Brandy Hensley  has presented today for surgery, with the diagnosis of Cholelithiasis  The various methods of treatment have been discussed with the patient and family. After consideration of risks, benefits and other options for treatment, the patient has consented to  Procedure(s) (LRB) with comments: LAPAROSCOPIC CHOLECYSTECTOMY (N/A) as a surgical intervention .  The patient's history has been reviewed, patient examined, no change in status, stable for surgery.  I have reviewed the patient's chart and labs.  Questions were answered to the patient's satisfaction.     Franky Macho A

## 2012-09-10 NOTE — Anesthesia Procedure Notes (Signed)
Procedure Name: Intubation Date/Time: 09/10/2012 9:39 AM Performed by: Franco Nones Pre-anesthesia Checklist: Patient identified, Patient being monitored, Timeout performed, Emergency Drugs available and Suction available Patient Re-evaluated:Patient Re-evaluated prior to inductionOxygen Delivery Method: Circle System Utilized Preoxygenation: Pre-oxygenation with 100% oxygen Intubation Type: IV induction, Rapid sequence and Cricoid Pressure applied Laryngoscope Size: Miller and 2 Grade View: Grade I Tube type: Oral Tube size: 7.0 mm Number of attempts: 1 Airway Equipment and Method: stylet Placement Confirmation: ETT inserted through vocal cords under direct vision,  positive ETCO2 and breath sounds checked- equal and bilateral Secured at: 21 cm Tube secured with: Tape Dental Injury: Teeth and Oropharynx as per pre-operative assessment

## 2012-09-14 ENCOUNTER — Encounter (HOSPITAL_COMMUNITY): Payer: Self-pay | Admitting: General Surgery

## 2012-10-28 ENCOUNTER — Other Ambulatory Visit: Payer: Self-pay | Admitting: Family Medicine

## 2012-10-28 DIAGNOSIS — Z139 Encounter for screening, unspecified: Secondary | ICD-10-CM

## 2012-11-15 ENCOUNTER — Ambulatory Visit (HOSPITAL_COMMUNITY)
Admission: RE | Admit: 2012-11-15 | Discharge: 2012-11-15 | Disposition: A | Discharge status: Home / Self Care | Payer: BC Managed Care – PPO | Source: Ambulatory Visit | Attending: Family Medicine | Admitting: Family Medicine

## 2012-11-15 DIAGNOSIS — Z139 Encounter for screening, unspecified: Secondary | ICD-10-CM

## 2012-11-15 DIAGNOSIS — Z1231 Encounter for screening mammogram for malignant neoplasm of breast: Secondary | ICD-10-CM | POA: Insufficient documentation

## 2012-11-25 ENCOUNTER — Encounter: Payer: Self-pay | Admitting: *Deleted

## 2012-11-29 ENCOUNTER — Encounter: Payer: Self-pay | Admitting: Nurse Practitioner

## 2012-11-29 ENCOUNTER — Other Ambulatory Visit: Payer: Self-pay | Admitting: Family Medicine

## 2012-11-29 ENCOUNTER — Ambulatory Visit (INDEPENDENT_AMBULATORY_CARE_PROVIDER_SITE_OTHER): Payer: BC Managed Care – PPO | Admitting: Nurse Practitioner

## 2012-11-29 VITALS — BP 112/90 | HR 80 | Ht 67.0 in | Wt 216.8 lb

## 2012-11-29 DIAGNOSIS — I951 Orthostatic hypotension: Secondary | ICD-10-CM | POA: Insufficient documentation

## 2012-11-29 DIAGNOSIS — M62838 Other muscle spasm: Secondary | ICD-10-CM

## 2012-11-29 MED ORDER — LORCASERIN HCL 10 MG PO TABS: 10.0000 mg | ORAL_TABLET | Freq: Two times a day (BID) | ORAL | Status: DC

## 2012-11-29 MED ORDER — CYCLOBENZAPRINE HCL 10 MG PO TABS: 10.0000 mg | ORAL_TABLET | Freq: Three times a day (TID) | ORAL | Status: DC | PRN

## 2012-11-29 NOTE — Progress Notes (Signed)
Subjective:  Presents for complaints of very sporadic episodes occurring a maximum of 1-2 times per month. Last less then a minute. Always occurs when she is in a standing position. Unassociated with meals or any particular activity. Describes a brief pulsing sensation in the head with decreased peripheral vision and feeling lightheaded. No headache. No chest pain shortness of breath or palpitations. No numbness or weakness of the face arms or legs. Is due for her routine eye exam. No visual problems. Has not identified any specific triggers. Also history of severe muscle spasms in the upper back and neck area on the left side which triggers intense left-sided headaches. This has been a chronic problem. Doing well on [place]. Occasional mild headache mainly in the mornings but very brief and nothing severe. Minimal exercise, has been working 12 hours a day after now. Has cut back on her sweet tea intake and is drinking more water.  Objective:   BP 112/90  Pulse 80  Ht 5\' 7"  (1.702 m)  Wt 216 lb 12.8 oz (98.34 kg)  BMI 33.95 kg/m2  LMP 11/06/2012 NAD. Alert, oriented. TMs minimal clear effusion, no erythema. Pharynx clear. Neck supple with minimal adenopathy. Lungs clear. Heart regular rate rhythm. Reflexes normal limit lower sternum these. Romberg negative. Gait normal limit. Orthostatic blood pressures indicate slight drop with change in position. Extremely tight tender muscles noted in the left trapezius and sternocleidomastoid area.

## 2012-11-29 NOTE — Assessment & Plan Note (Signed)
Advised patient to use caution with sudden position change. Avoid dehydration. Sit down immediately if she feels like she is going to faint. Call back if symptoms worsen or persist.

## 2012-11-29 NOTE — Assessment & Plan Note (Signed)
Continue Belviq as directed. Increase activity. Continue healthy diet. Recheck in 3 months.

## 2012-11-29 NOTE — Progress Notes (Signed)
Orthostatics- sitting 122/70, standing 102/68, lying 110/70

## 2012-11-29 NOTE — Assessment & Plan Note (Signed)
Continue Flexeril as directed. Massage therapy. Given prescription for TENS unit to use as directed. Ice/heat applications. Call back if no improvement, consider physical therapy at that time.

## 2012-11-30 NOTE — Telephone Encounter (Signed)
Patient was given prescription for Belviq at last visit.  It was a printed prescription with 2 refills.

## 2012-12-09 ENCOUNTER — Telehealth: Payer: Self-pay | Admitting: Family Medicine

## 2012-12-09 NOTE — Telephone Encounter (Signed)
Prior Auth for Belviq 10mg  expires 06/06/2013, approval obtained from [facility], [facility]

## 2012-12-14 ENCOUNTER — Ambulatory Visit (INDEPENDENT_AMBULATORY_CARE_PROVIDER_SITE_OTHER): Payer: BC Managed Care – PPO | Admitting: Nurse Practitioner

## 2012-12-14 ENCOUNTER — Encounter: Payer: Self-pay | Admitting: Nurse Practitioner

## 2012-12-14 VITALS — BP 112/82 | Temp 98.2°F | Wt 217.0 lb

## 2012-12-14 DIAGNOSIS — J069 Acute upper respiratory infection, unspecified: Secondary | ICD-10-CM

## 2012-12-14 DIAGNOSIS — J358 Other chronic diseases of tonsils and adenoids: Secondary | ICD-10-CM

## 2012-12-14 MED ORDER — FLUCONAZOLE 150 MG PO TABS: 150.0000 mg | ORAL_TABLET | Freq: Once | ORAL | Status: DC

## 2012-12-14 MED ORDER — AMOXICILLIN-POT CLAVULANATE 875-125 MG PO TABS: 1.0000 | ORAL_TABLET | Freq: Two times a day (BID) | ORAL | Status: DC

## 2012-12-14 MED ORDER — CEFTRIAXONE SODIUM 1 G IJ SOLR
500.0000 mg | Freq: Once | INTRAMUSCULAR | Status: AC
  Administered 2012-12-14: 500 mg via INTRAMUSCULAR

## 2012-12-14 NOTE — Progress Notes (Signed)
Subjective:  Presents complaints of cough that began 2 days ago. Slight burning in the chest with cough. No head congestion. Producing yellow sputum. Spells of coughing. No fever but has had some chills. Ear pressure. Sore throat. Generalized headache. Rare wheeze with cough. Taking fluids well. Reflux stable.  Objective:   BP 112/82  Temp(Src) 98.2 F (36.8 C) (Oral)  Wt 217 lb (98.431 kg)  BMI 33.98 kg/m2  LMP 12/04/2012 NAD. Alert, oriented. TMs retracted, no erythema. Pharynx mildly erythematous with PND noted. Several pockets of yellowish sebaceous material removed from holes in her tonsils without difficulty. Slight inflammation and tenderness noted on the right side. Neck supple with minimal adenopathy. Lungs clear. Heart regular rate rhythm.  Assessment:Acute upper respiratory infection - Plan: cefTRIAXone (ROCEPHIN) injection 500 mg  Symptomatic tonsillar crypt  Plan: Meds ordered this encounter  Medications  . amoxicillin-clavulanate (AUGMENTIN) 875-125 MG per tablet    Sig: Take 1 tablet by mouth 2 (two) times daily.    Dispense:  20 tablet    Refill:  0    Order Specific Question:  Supervising Provider    Answer:  Merlyn Albert [[id number]]  . fluconazole (DIFLUCAN) 150 MG tablet    Sig: Take 1 tablet (150 mg total) by mouth once. Prn yeast infection    Dispense:  2 tablet    Refill:  0    Order Specific Question:  Supervising Provider    Answer:  Merlyn Albert [[id number]]  . cefTRIAXone (ROCEPHIN) injection 500 mg    Sig:    Call back by the end of the week if no improvement, sooner if worse.

## 2013-03-02 ENCOUNTER — Other Ambulatory Visit: Payer: Self-pay | Admitting: Nurse Practitioner

## 2013-03-02 ENCOUNTER — Telehealth: Payer: Self-pay | Admitting: Family Medicine

## 2013-03-02 NOTE — Telephone Encounter (Signed)
Patient says she will be out of her Belviq around July 15,2014, but has an appointment for July 16th with Eber Jones for a med check. She is requesting that we send in one more refill of this medication so she doesn't go without.  [facility] uses [facility]

## 2013-03-04 ENCOUNTER — Other Ambulatory Visit: Payer: Self-pay | Admitting: Nurse Practitioner

## 2013-03-04 MED ORDER — LORCASERIN HCL 10 MG PO TABS: 10.0000 mg | ORAL_TABLET | Freq: Two times a day (BID) | ORAL | Status: DC

## 2013-03-09 ENCOUNTER — Ambulatory Visit (INDEPENDENT_AMBULATORY_CARE_PROVIDER_SITE_OTHER): Payer: BC Managed Care – PPO | Admitting: Nurse Practitioner

## 2013-03-09 ENCOUNTER — Encounter: Payer: Self-pay | Admitting: Nurse Practitioner

## 2013-03-09 NOTE — Progress Notes (Signed)
Subjective:  Presents for recheck. Taking Belviq. Had a slight headache initially, this has resolved. Minimal constipation occasionally, otherwise medication is very well tolerated. Has tried to increase her walking. Doing much better with her diet. Would like to continue medication for now.  Objective:   BP 112/70  Pulse 70  Wt 202 lb (91.627 kg)  BMI 31.63 kg/m2 NAD. Alert, oriented. Lungs clear. Heart regular rate rhythm. Has lost 23 pounds since her visit in March.  Assessment:Morbid obesity  Plan: Computer was not working during office visit, given written prescription for Belviq 10 mg 1 by mouth twice a day (60 with 5 monthly refills). Recheck in 6 months, call back sooner if any problems.

## 2013-03-09 NOTE — Assessment & Plan Note (Signed)
Continue Belviq 10 mg 1 by mouth twice a day.

## 2013-04-21 ENCOUNTER — Ambulatory Visit: Payer: BC Managed Care – PPO | Admitting: Nurse Practitioner

## 2013-06-14 ENCOUNTER — Other Ambulatory Visit: Payer: Self-pay | Admitting: Family Medicine

## 2013-09-05 ENCOUNTER — Other Ambulatory Visit: Payer: Self-pay | Admitting: Family Medicine

## 2013-09-20 ENCOUNTER — Telehealth: Payer: Self-pay | Admitting: Family Medicine

## 2013-09-20 NOTE — Telephone Encounter (Signed)
Patient needs Rx for [facility]q to [facility] [place]

## 2013-09-20 NOTE — Telephone Encounter (Signed)
Also, patient needs order for blood work

## 2013-09-21 ENCOUNTER — Other Ambulatory Visit: Payer: Self-pay | Admitting: Nurse Practitioner

## 2013-09-21 DIAGNOSIS — Z0000 Encounter for general adult medical examination without abnormal findings: Secondary | ICD-10-CM

## 2013-09-21 MED ORDER — LORCASERIN HCL 10 MG PO TABS: 10.0000 mg | ORAL_TABLET | Freq: Two times a day (BID) | ORAL | Status: DC

## 2013-09-22 ENCOUNTER — Other Ambulatory Visit: Payer: Self-pay | Admitting: *Deleted

## 2013-09-22 DIAGNOSIS — Z0000 Encounter for general adult medical examination without abnormal findings: Secondary | ICD-10-CM

## 2013-10-17 LAB — BASIC METABOLIC PANEL
BUN: 9 mg/dL (ref 6–23)
CO2: 22 mEq/L (ref 19–32)
CREATININE: 0.8 mg/dL (ref 0.50–1.10)
Calcium: 8.8 mg/dL (ref 8.4–10.5)
Chloride: 106 mEq/L (ref 96–112)
GLUCOSE: 95 mg/dL (ref 70–99)
POTASSIUM: 3.9 meq/L (ref 3.5–5.3)
Sodium: 137 mEq/L (ref 135–145)

## 2013-10-17 LAB — CBC WITH DIFFERENTIAL/PLATELET
BASOS ABS: 0 10*3/uL (ref 0.0–0.1)
Basophils Relative: 0 % (ref 0–1)
EOS ABS: 0.2 10*3/uL (ref 0.0–0.7)
EOS PCT: 3 % (ref 0–5)
HCT: 37 % (ref 36.0–46.0)
Hemoglobin: 12.4 g/dL (ref 12.0–15.0)
LYMPHS PCT: 29 % (ref 12–46)
Lymphs Abs: 2 10*3/uL (ref 0.7–4.0)
MCH: 29.4 pg (ref 26.0–34.0)
MCHC: 33.5 g/dL (ref 30.0–36.0)
MCV: 87.7 fL (ref 78.0–100.0)
Monocytes Absolute: 0.6 10*3/uL (ref 0.1–1.0)
Monocytes Relative: 9 % (ref 3–12)
NEUTROS PCT: 59 % (ref 43–77)
Neutro Abs: 4.1 10*3/uL (ref 1.7–7.7)
PLATELETS: 145 10*3/uL — AB (ref 150–400)
RBC: 4.22 MIL/uL (ref 3.87–5.11)
RDW: 14.6 % (ref 11.5–15.5)
WBC: 6.8 10*3/uL (ref 4.0–10.5)

## 2013-10-17 LAB — LIPID PANEL
Cholesterol: 160 mg/dL (ref 0–200)
HDL: 38 mg/dL — AB (ref >39)
LDL Cholesterol: 107 mg/dL — ABNORMAL HIGH (ref 0–99)
TRIGLYCERIDES: 77 mg/dL (ref <150)
Total CHOL/HDL Ratio: 4.2 Ratio
VLDL: 15 mg/dL (ref 0–40)

## 2013-10-17 LAB — HEPATIC FUNCTION PANEL
ALBUMIN: 4.1 g/dL (ref 3.5–5.2)
ALT: 15 U/L (ref 0–35)
AST: 19 U/L (ref 0–37)
Alkaline Phosphatase: 67 U/L (ref 39–117)
BILIRUBIN INDIRECT: 0.8 mg/dL (ref 0.2–1.2)
BILIRUBIN TOTAL: 1 mg/dL (ref 0.2–1.2)
Bilirubin, Direct: 0.2 mg/dL (ref 0.0–0.3)
TOTAL PROTEIN: 6.4 g/dL (ref 6.0–8.3)

## 2013-10-17 LAB — TSH: TSH: 1.714 u[IU]/mL (ref 0.350–4.500)

## 2013-10-18 LAB — VITAMIN D 25 HYDROXY (VIT D DEFICIENCY, FRACTURES): Vit D, 25-Hydroxy: 35 ng/mL (ref 30–89)

## 2013-10-19 ENCOUNTER — Encounter: Payer: Self-pay | Admitting: Nurse Practitioner

## 2013-10-19 ENCOUNTER — Other Ambulatory Visit: Payer: Self-pay | Admitting: Nurse Practitioner

## 2013-10-19 DIAGNOSIS — D691 Qualitative platelet defects: Secondary | ICD-10-CM

## 2013-10-31 ENCOUNTER — Ambulatory Visit (INDEPENDENT_AMBULATORY_CARE_PROVIDER_SITE_OTHER): Payer: BC Managed Care – PPO | Admitting: Nurse Practitioner

## 2013-10-31 ENCOUNTER — Encounter: Payer: Self-pay | Admitting: Nurse Practitioner

## 2013-10-31 VITALS — BP 108/64 | HR 70 | Ht 66.5 in | Wt 186.0 lb

## 2013-10-31 DIAGNOSIS — K219 Gastro-esophageal reflux disease without esophagitis: Secondary | ICD-10-CM | POA: Insufficient documentation

## 2013-10-31 DIAGNOSIS — Z1239 Encounter for other screening for malignant neoplasm of breast: Secondary | ICD-10-CM

## 2013-10-31 DIAGNOSIS — Z0000 Encounter for general adult medical examination without abnormal findings: Secondary | ICD-10-CM

## 2013-10-31 DIAGNOSIS — Z01419 Encounter for gynecological examination (general) (routine) without abnormal findings: Secondary | ICD-10-CM

## 2013-10-31 MED ORDER — PANTOPRAZOLE SODIUM 40 MG PO TBEC: DELAYED_RELEASE_TABLET | ORAL | Status: DC

## 2013-10-31 MED ORDER — LORCASERIN HCL 10 MG PO TABS: 10.0000 mg | ORAL_TABLET | Freq: Two times a day (BID) | ORAL | Status: DC

## 2013-10-31 NOTE — Progress Notes (Signed)
   Subjective:    Patient ID: Frankey Pooteana L Tumolo, female    DOB: [birth year], 43 y.o.   MRN: [id number]  HPI  Presents for wellness checkup. Doing well with diet and weight loss. Menses slightly irreg at times with varying light to heavy flows. No new sexual partners; no activity x 4 years. Regular vision and dental exams.     Review of Systems  Constitutional: Negative for fever, activity change, appetite change and fatigue.  HENT: Negative for dental problem, ear pain, rhinorrhea, sinus pressure and sore throat.        Mild ear pressure with off and on muffled hearing  Respiratory: Negative for cough, chest tightness, shortness of breath and wheezing.   Cardiovascular: Negative for chest pain.  Gastrointestinal: Negative for nausea, vomiting, abdominal pain, diarrhea and constipation.  Genitourinary: Positive for vaginal discharge and enuresis. Negative for dysuria, urgency, frequency, difficulty urinating, menstrual problem and pelvic pain.       Mild urinary leakage especially with coughing or sneezing; not a significant problem at this time Chronic discharge; no color, odor or itching/burning       Objective:   Physical Exam  Constitutional: She is oriented to person, place, and time. She appears well-developed. No distress.  HENT:  Right Ear: External ear normal.  Left Ear: External ear normal.  Mouth/Throat: Oropharynx is clear and moist.  Neck: Normal range of motion. Neck supple. No tracheal deviation present. No thyromegaly present.  Cardiovascular: Normal rate, regular rhythm and normal heart sounds.  Exam reveals no gallop.   No murmur heard. Pulmonary/Chest: Effort normal and breath sounds normal.  Abdominal: Soft. She exhibits no distension. There is no tenderness.  Genitourinary: Vagina normal and uterus normal. No vaginal discharge found.  Musculoskeletal: She exhibits no edema.  Lymphadenopathy:    She has no cervical adenopathy.  Neurological: She is alert and oriented  to person, place, and time.  Skin: Skin is warm and dry. No rash noted.  Psychiatric: She has a normal mood and affect. Her behavior is normal.   TMs mild clear effusion, no erythema. Breast exam very dense tissue, no dominant masses, axillae no adenopathy. External GU no rash or lesions noted. Bimanual exam no masses, nontender. See labs dated 2/23      Assessment & Plan:  Well woman exam  Screening for breast cancer - Plan: MM Digital Screening  Meds ordered this encounter  Medications  . Lorcaserin HCl (BELVIQ) 10 MG TABS    Sig: Take 10 mg by mouth 2 (two) times daily.    Dispense:  60 tablet    Refill:  2    [facility]    Order Specific Question:  Supervising Provider    Answer:  Merlyn AlbertLUKING, WILLIAM S [[id number]]  . pantoprazole (PROTONIX) 40 MG tablet    Sig: TAKE 1 TABLET BY MOUTH EVERY DAY    Dispense:  30 tablet    Refill:  5    Order Specific Question:  Supervising Provider    Answer:  Merlyn AlbertLUKING, WILLIAM S [[id number]]   Encouraged healthy diet and regular exercise. Encouraged continued weight loss. Daily vitamin D and calcium. Next physical in one year.  Return in about 3 months (around 01/31/2014). Call back sooner if any problems.

## 2013-11-21 ENCOUNTER — Ambulatory Visit (HOSPITAL_COMMUNITY): Payer: BC Managed Care – PPO

## 2013-12-05 ENCOUNTER — Ambulatory Visit (HOSPITAL_COMMUNITY)
Admission: RE | Admit: 2013-12-05 | Discharge: 2013-12-05 | Disposition: A | Discharge status: Home / Self Care | Payer: BC Managed Care – PPO | Source: Ambulatory Visit | Attending: Nurse Practitioner | Admitting: Nurse Practitioner

## 2013-12-05 DIAGNOSIS — Z1231 Encounter for screening mammogram for malignant neoplasm of breast: Secondary | ICD-10-CM | POA: Insufficient documentation

## 2013-12-06 ENCOUNTER — Other Ambulatory Visit: Payer: Self-pay | Admitting: Nurse Practitioner

## 2013-12-06 DIAGNOSIS — R928 Other abnormal and inconclusive findings on diagnostic imaging of breast: Secondary | ICD-10-CM

## 2013-12-14 ENCOUNTER — Ambulatory Visit (HOSPITAL_COMMUNITY)
Admission: RE | Admit: 2013-12-14 | Discharge: 2013-12-14 | Disposition: A | Discharge status: Home / Self Care | Payer: BC Managed Care – PPO | Source: Ambulatory Visit | Attending: Nurse Practitioner | Admitting: Nurse Practitioner

## 2013-12-14 ENCOUNTER — Other Ambulatory Visit: Payer: Self-pay | Admitting: Nurse Practitioner

## 2013-12-14 DIAGNOSIS — R928 Other abnormal and inconclusive findings on diagnostic imaging of breast: Secondary | ICD-10-CM | POA: Insufficient documentation

## 2013-12-20 ENCOUNTER — Other Ambulatory Visit: Payer: Self-pay | Admitting: Nurse Practitioner

## 2014-01-30 ENCOUNTER — Encounter: Payer: Self-pay | Admitting: Nurse Practitioner

## 2014-01-30 ENCOUNTER — Ambulatory Visit (INDEPENDENT_AMBULATORY_CARE_PROVIDER_SITE_OTHER): Payer: BC Managed Care – PPO | Admitting: Nurse Practitioner

## 2014-01-30 VITALS — BP 122/70 | Ht 66.5 in | Wt 176.0 lb

## 2014-01-30 DIAGNOSIS — H9190 Unspecified hearing loss, unspecified ear: Secondary | ICD-10-CM

## 2014-01-30 DIAGNOSIS — K219 Gastro-esophageal reflux disease without esophagitis: Secondary | ICD-10-CM

## 2014-01-30 MED ORDER — LORCASERIN HCL 10 MG PO TABS: 10.0000 mg | ORAL_TABLET | Freq: Two times a day (BID) | ORAL | Status: DC

## 2014-02-01 ENCOUNTER — Encounter: Payer: Self-pay | Admitting: Nurse Practitioner

## 2014-02-01 NOTE — Progress Notes (Signed)
Subjective:  Presents for recheck. Limited activity but still doing well with diet. Denies any side effects from Belviq. Reflux stable on Protonix. C/o daily itching and pressure in the ears for over 6 months. Also hearing fading in and out at times which has caused issues with her job.   Objective:   BP 122/70  Ht 5' 6.5" (1.689 m)  Wt 176 lb (79.833 kg)  BMI 27.98 kg/m2 NAD. Alert, oriented. TMs mild clear effusion. Hearing grossly intact at conversational tones. Lungs clear. Heart RRR. abd soft, nontender.  Assessment:  Problem List Items Addressed This Visit     Digestive   GERD (gastroesophageal reflux disease) - Primary     Other   Morbid obesity (Chronic)   Relevant Medications      Lorcaserin HCl (BELVIQ) 10 MG TABS    Other Visit Diagnoses   Change in hearing          Will refer to ENT specialist for evaluation. Return if symptoms worsen or fail to improve.

## 2014-03-09 ENCOUNTER — Ambulatory Visit (INDEPENDENT_AMBULATORY_CARE_PROVIDER_SITE_OTHER): Payer: BC Managed Care – PPO | Admitting: Otolaryngology

## 2014-03-09 DIAGNOSIS — H903 Sensorineural hearing loss, bilateral: Secondary | ICD-10-CM

## 2014-03-09 DIAGNOSIS — H6980 Other specified disorders of Eustachian tube, unspecified ear: Secondary | ICD-10-CM

## 2014-04-06 ENCOUNTER — Ambulatory Visit (INDEPENDENT_AMBULATORY_CARE_PROVIDER_SITE_OTHER): Payer: BC Managed Care – PPO | Admitting: Otolaryngology

## 2014-04-20 ENCOUNTER — Other Ambulatory Visit: Payer: Self-pay | Admitting: Nurse Practitioner

## 2014-05-02 ENCOUNTER — Ambulatory Visit (INDEPENDENT_AMBULATORY_CARE_PROVIDER_SITE_OTHER): Payer: BC Managed Care – PPO | Admitting: Family Medicine

## 2014-05-02 ENCOUNTER — Encounter: Payer: Self-pay | Admitting: Family Medicine

## 2014-05-02 VITALS — BP 106/74 | Temp 98.5°F | Ht 66.25 in | Wt 176.0 lb

## 2014-05-02 DIAGNOSIS — J209 Acute bronchitis, unspecified: Secondary | ICD-10-CM

## 2014-05-02 DIAGNOSIS — J329 Chronic sinusitis, unspecified: Secondary | ICD-10-CM

## 2014-05-02 MED ORDER — CEFTRIAXONE SODIUM 1 G IJ SOLR
500.0000 mg | Freq: Once | INTRAMUSCULAR | Status: AC
  Administered 2014-05-02: 500 mg via INTRAMUSCULAR

## 2014-05-02 MED ORDER — ALBUTEROL SULFATE HFA 108 (90 BASE) MCG/ACT IN AERS: 2.0000 | INHALATION_SPRAY | RESPIRATORY_TRACT | Status: DC | PRN

## 2014-05-02 MED ORDER — CEFDINIR 300 MG PO CAPS: 300.0000 mg | ORAL_CAPSULE | Freq: Two times a day (BID) | ORAL | Status: DC

## 2014-05-02 MED ORDER — FLUCONAZOLE 150 MG PO TABS
ORAL_TABLET | ORAL | Status: DC
Start: 2014-05-02 — End: 2014-11-06

## 2014-05-02 NOTE — Progress Notes (Signed)
   Subjective:    Patient ID: Brandy Hensley, female    DOB: 1971-03-09, 43 y.o.   MRN: [id number]  Cough This is a new problem. The current episode started in the past 7 days. Associated symptoms include a fever, headaches, myalgias and nasal congestion. Associated symptoms comments: Chest tightness, congestion. Treatments tried: mucinex.   Stared on sat  Sneezing  achey diminished energy  Head and congestion  Got mucinex prn   Pack per day  thcik and deep cough not very prcuctive, gunky in nature grenish yell and clear disch  Dull ache in eyes     Review of Systems  Constitutional: Positive for fever.  Respiratory: Positive for cough.   Musculoskeletal: Positive for myalgias.  Neurological: Positive for headaches.       Objective:   Physical Exam  Alert moderate malaise. HEENT moderate nasal congestion frontal tenderness. Pharynx erythematous neck supple lungs are lateral wheeze intermittent bronchial cough heart regular in rhythm.      Assessment & Plan:  Impression rhinosinusitis/bronchitis with reactive airways plan encouraged to stop smoking. Rocephin injection per patient request. Ventolin when necessary. Omnicef twice a day 10 days. Symptomatic care discussed. WSL

## 2014-07-10 ENCOUNTER — Other Ambulatory Visit: Payer: Self-pay | Admitting: Nurse Practitioner

## 2014-07-10 NOTE — Telephone Encounter (Signed)
Carolyn to see 

## 2014-07-10 NOTE — Telephone Encounter (Signed)
Last seen 01/2014

## 2014-07-11 NOTE — Telephone Encounter (Signed)
I am sending this now. Will sign when I come to meeting today. Thanks.

## 2014-07-22 ENCOUNTER — Other Ambulatory Visit: Payer: Self-pay | Admitting: Family Medicine

## 2014-08-11 ENCOUNTER — Other Ambulatory Visit: Payer: Self-pay | Admitting: Nurse Practitioner

## 2014-08-11 NOTE — Telephone Encounter (Signed)
Seen 01/30/14

## 2014-08-21 ENCOUNTER — Telehealth: Payer: Self-pay | Admitting: Family Medicine

## 2014-08-21 NOTE — Telephone Encounter (Signed)
Rx prior auth APPROVED for pt's BELVIQ 10 MG TABS, expires 08/17/15 [facility], [facility]

## 2014-09-13 ENCOUNTER — Other Ambulatory Visit: Payer: Self-pay | Admitting: Nurse Practitioner

## 2014-09-13 NOTE — Telephone Encounter (Signed)
Last seen 05/02/14 for a sick visit.

## 2014-09-19 NOTE — Telephone Encounter (Signed)
Will refill enough to get her to her physical appt. Thanks. Will sign Wednesday am.

## 2014-11-06 ENCOUNTER — Ambulatory Visit (INDEPENDENT_AMBULATORY_CARE_PROVIDER_SITE_OTHER): Payer: BLUE CROSS/BLUE SHIELD | Admitting: Nurse Practitioner

## 2014-11-06 ENCOUNTER — Encounter: Payer: Self-pay | Admitting: Nurse Practitioner

## 2014-11-06 VITALS — BP 118/82 | Ht 66.0 in | Wt 184.0 lb

## 2014-11-06 DIAGNOSIS — N939 Abnormal uterine and vaginal bleeding, unspecified: Secondary | ICD-10-CM

## 2014-11-06 DIAGNOSIS — Z0000 Encounter for general adult medical examination without abnormal findings: Secondary | ICD-10-CM | POA: Diagnosis not present

## 2014-11-06 DIAGNOSIS — Z01419 Encounter for gynecological examination (general) (routine) without abnormal findings: Secondary | ICD-10-CM

## 2014-11-06 DIAGNOSIS — Z79899 Other long term (current) drug therapy: Secondary | ICD-10-CM | POA: Diagnosis not present

## 2014-11-06 NOTE — Patient Instructions (Signed)
Weight Watchers [facility] Diet Low glycemic (GI) diet TOPS

## 2014-11-06 NOTE — Progress Notes (Signed)
   Subjective:    Patient ID: Brandy Hensley, female    DOB: [birth year], 44 y.o.   MRN: [id number]  HPI presents for her wellness exam. No new sexual partners since last PAP. Having a menses once a month heavy flow lasting 5-7 d. With stress and holidays will have a second cycle just about 5 d. Regular vision and dental exams. Well balanced diet overall; some stress eating. Has not lost any more weight on Belviq. No GERD, off Protonix. Patient plans to schedule own mammogram. On her cycle today. No regular exercise.     Review of Systems  Constitutional: Negative for fever, activity change, appetite change and fatigue.  HENT: Negative for dental problem, ear pain, sinus pressure and sore throat.   Respiratory: Negative for cough, chest tightness, shortness of breath and wheezing.   Cardiovascular: Negative for chest pain.  Gastrointestinal: Negative for nausea, vomiting, abdominal pain, diarrhea, constipation and abdominal distention.  Genitourinary: Positive for menstrual problem. Negative for dysuria, urgency, frequency, vaginal discharge, enuresis, difficulty urinating, genital sores and pelvic pain.       Objective:   Physical Exam  Constitutional: She is oriented to person, place, and time. She appears well-developed. No distress.  HENT:  Right Ear: External ear normal.  Left Ear: External ear normal.  Mouth/Throat: Oropharynx is clear and moist.  Neck: Normal range of motion. Neck supple. No tracheal deviation present. No thyromegaly present.  Cardiovascular: Normal rate, regular rhythm and normal heart sounds.  Exam reveals no gallop.   No murmur heard. Pulmonary/Chest: Effort normal and breath sounds normal.  Abdominal: Soft. She exhibits no distension. There is no tenderness.  Genitourinary:  GU exam deferred; on menses.  Musculoskeletal: She exhibits no edema.  Lymphadenopathy:    She has no cervical adenopathy.  Neurological: She is alert and oriented to person, place, and  time.  Skin: Skin is warm and dry. No rash noted.  Psychiatric: She has a normal mood and affect. Her behavior is normal.  Vitals reviewed. Breast exam: very dense tissue; no dominant masses; axillae no adenopathy.        Assessment & Plan:  Well woman exam - Plan: CBC with Differential/Platelet, Lipid panel, Hepatic function panel, Basic metabolic panel  Abnormal uterine bleeding secondary to perimenopause - Plan: CBC with Differential/Platelet  High risk medication use - Plan: Hepatic function panel  Recommend regular exercise, healthy diet and daily vitamin D/calcium supplement. Given information on Contrave.  Pelvic exam with PAP at next PE. Reviewed options re menses; defers at this time.  Return in about 1 year (around 11/06/2015).

## 2014-11-12 LAB — BASIC METABOLIC PANEL
BUN / CREAT RATIO: 10 (ref 9–23)
BUN: 7 mg/dL (ref 6–24)
CALCIUM: 9 mg/dL (ref 8.7–10.2)
CO2: 23 mmol/L (ref 18–29)
Chloride: 104 mmol/L (ref 97–108)
Creatinine, Ser: 0.71 mg/dL (ref 0.57–1.00)
GFR calc Af Amer: 121 mL/min/{1.73_m2} (ref >59)
GFR calc non Af Amer: 105 mL/min/{1.73_m2} (ref >59)
Glucose: 91 mg/dL (ref 65–99)
POTASSIUM: 4.1 mmol/L (ref 3.5–5.2)
Sodium: 143 mmol/L (ref 134–144)

## 2014-11-12 LAB — HEPATIC FUNCTION PANEL
ALT: 11 IU/L (ref 0–32)
AST: 17 IU/L (ref 0–40)
Albumin: 4 g/dL (ref 3.5–5.5)
Alkaline Phosphatase: 68 IU/L (ref 39–117)
BILIRUBIN, DIRECT: 0.17 mg/dL (ref 0.00–0.40)
Bilirubin Total: 0.7 mg/dL (ref 0.0–1.2)
TOTAL PROTEIN: 6.2 g/dL (ref 6.0–8.5)

## 2014-11-12 LAB — CBC WITH DIFFERENTIAL/PLATELET
Basophils Absolute: 0 10*3/uL (ref 0.0–0.2)
Basos: 0 %
Eos: 3 %
Eosinophils Absolute: 0.3 10*3/uL (ref 0.0–0.4)
HCT: 42.6 % (ref 34.0–46.6)
Hemoglobin: 14.4 g/dL (ref 11.1–15.9)
Immature Grans (Abs): 0 10*3/uL (ref 0.0–0.1)
Immature Granulocytes: 0 %
Lymphocytes Absolute: 3.5 10*3/uL — ABNORMAL HIGH (ref 0.7–3.1)
Lymphs: 37 %
MCH: 32.4 pg (ref 26.6–33.0)
MCHC: 33.8 g/dL (ref 31.5–35.7)
MCV: 96 fL (ref 79–97)
Monocytes Absolute: 0.6 10*3/uL (ref 0.1–0.9)
Monocytes: 6 %
Neutrophils Absolute: 4.9 10*3/uL (ref 1.4–7.0)
Neutrophils Relative %: 54 %
Platelets: 208 10*3/uL (ref 150–379)
RBC: 4.45 x10E6/uL (ref 3.77–5.28)
RDW: 13.5 % (ref 12.3–15.4)
WBC: 9.3 10*3/uL (ref 3.4–10.8)

## 2014-11-12 LAB — LIPID PANEL
CHOLESTEROL TOTAL: 204 mg/dL — AB (ref 100–199)
Chol/HDL Ratio: 4.7 ratio units — ABNORMAL HIGH (ref 0.0–4.4)
HDL: 43 mg/dL (ref >39)
LDL CALC: 146 mg/dL — AB (ref 0–99)
TRIGLYCERIDES: 74 mg/dL (ref 0–149)
VLDL Cholesterol Cal: 15 mg/dL (ref 5–40)

## 2014-12-18 ENCOUNTER — Telehealth: Payer: Self-pay | Admitting: Nurse Practitioner

## 2014-12-18 NOTE — Telephone Encounter (Signed)
Pt notifed carolyn out of office and will get  Message on wed

## 2014-12-18 NOTE — Telephone Encounter (Signed)
Pt seen you back in March you discussed with her about using  Contrave, she would like to go ahead an use this or try it now   [facility]

## 2014-12-20 ENCOUNTER — Other Ambulatory Visit: Payer: Self-pay | Admitting: Nurse Practitioner

## 2014-12-20 NOTE — Telephone Encounter (Signed)
Pt wants script and coupon

## 2014-12-20 NOTE — Telephone Encounter (Signed)
Will print prescription. Does she have coupon/voucher? If not, we can leave up front. She will need to pick up written Rx so she can get cost of med when she turns into pharmacy.

## 2014-12-21 ENCOUNTER — Ambulatory Visit (INDEPENDENT_AMBULATORY_CARE_PROVIDER_SITE_OTHER): Payer: BLUE CROSS/BLUE SHIELD | Admitting: Nurse Practitioner

## 2014-12-21 ENCOUNTER — Other Ambulatory Visit: Payer: Self-pay | Admitting: Nurse Practitioner

## 2014-12-21 ENCOUNTER — Encounter: Payer: Self-pay | Admitting: Nurse Practitioner

## 2014-12-21 VITALS — BP 106/84 | Ht 66.0 in | Wt 183.0 lb

## 2014-12-21 DIAGNOSIS — F419 Anxiety disorder, unspecified: Secondary | ICD-10-CM | POA: Diagnosis not present

## 2014-12-21 DIAGNOSIS — F411 Generalized anxiety disorder: Secondary | ICD-10-CM

## 2014-12-21 DIAGNOSIS — F430 Acute stress reaction: Principal | ICD-10-CM

## 2014-12-21 MED ORDER — CYCLOBENZAPRINE HCL 10 MG PO TABS: ORAL_TABLET | ORAL | Status: DC

## 2014-12-21 MED ORDER — LORCASERIN HCL 10 MG PO TABS: ORAL_TABLET | ORAL | Status: DC

## 2014-12-21 MED ORDER — ESCITALOPRAM OXALATE 10 MG PO TABS: 10.0000 mg | ORAL_TABLET | Freq: Every day | ORAL | Status: DC

## 2014-12-21 MED ORDER — CLONAZEPAM 0.5 MG PO TABS: 0.5000 mg | ORAL_TABLET | Freq: Two times a day (BID) | ORAL | Status: DC | PRN

## 2014-12-21 NOTE — Telephone Encounter (Signed)
I reviewed criteria for Contrave since this is still a new drug. She does not meet criteria for taking medication. Sorry I did not realize that sooner.

## 2014-12-21 NOTE — Telephone Encounter (Signed)
Try for 3 more months but if no significant weight loss, recommend stopping. Will print Rx.

## 2014-12-21 NOTE — Telephone Encounter (Signed)
Discussed with pt. Pt wants to know if she can get refill on belviq since she cant take contrave.

## 2014-12-21 NOTE — Telephone Encounter (Signed)
Rx up front for pick up. Patient notified and verbalized understanding.

## 2014-12-24 ENCOUNTER — Encounter: Payer: Self-pay | Admitting: Nurse Practitioner

## 2014-12-24 NOTE — Progress Notes (Signed)
Subjective:  Presents for complaints of excessive stress and anxiety. Has had issues with one of her sons. Works in [employer]. Will be losing her job this fall. Last night had an episode of feeling warm with sweating increase heart rate, felt like her heart was doing "flips" area slept well last night. Nausea with vomiting 1, felt better after this. No diarrhea or abdominal pain. Mild headache. Unrelated to meals or foods. This episode occurred after dealing with her son. Has felt nervous and upset today. Emotional lability. Fatigue. Denies suicidal or homicidal thoughts or ideation.  Objective:   BP 106/84 mmHg  Ht 5\' 6"  (1.676 m)  Wt 183 lb (83.008 kg)  BMI 29.55 kg/m2  LMP 11/22/2014 NAD. Alert, oriented. Thoughts logical coherent and relevant. Dressed appropriately. Crying at times during office visit. Repeatedly saying "this is not me" relating to the fact of being upset and crying. Lungs clear. Heart regular rate rhythm. Pulse 92. BP on recheck right arm sitting 118/82.  Assessment: Anxiety as acute reaction to exceptional stress   Plan:  Meds ordered this encounter  Medications  . clonazePAM (KLONOPIN) 0.5 MG tablet    Sig: Take 1 tablet (0.5 mg total) by mouth 2 (two) times daily as needed for anxiety.    Dispense:  30 tablet    Refill:  0    Order Specific Question:  Supervising Provider    Answer:  Merlyn AlbertLUKING, WILLIAM S [[id number]]  . escitalopram (LEXAPRO) 10 MG tablet    Sig: Take 1 tablet (10 mg total) by mouth daily.    Dispense:  30 tablet    Refill:  0    Order Specific Question:  Supervising Provider    Answer:  Merlyn AlbertLUKING, WILLIAM S [[id number]]  . cyclobenzaprine (FLEXERIL) 10 MG tablet    Sig: TAKE 1 TABLET 3 TIMES A DAY AS NEEDED MUSCLE SPASMS    Dispense:  30 tablet    Refill:  1    Order Specific Question:  Supervising Provider    Answer:  Merlyn AlbertLUKING, WILLIAM S [[id number]]   Discussed importance of regular activity and stress reduction. Klonopin given for any panic attacks or severe  anxiety, drowsiness precautions. Refilled Flexeril per patient request. Reviewed potential adverse effects of Lexapro, DC med and call if any problems. Return in about 1 month (around 01/20/2015) for recheck.

## 2015-01-18 ENCOUNTER — Ambulatory Visit (INDEPENDENT_AMBULATORY_CARE_PROVIDER_SITE_OTHER): Payer: BLUE CROSS/BLUE SHIELD | Admitting: Nurse Practitioner

## 2015-01-18 ENCOUNTER — Encounter: Payer: Self-pay | Admitting: Nurse Practitioner

## 2015-01-18 VITALS — BP 110/78 | Ht 66.0 in | Wt 184.4 lb

## 2015-01-18 DIAGNOSIS — K2100 Gastro-esophageal reflux disease with esophagitis, without bleeding: Secondary | ICD-10-CM

## 2015-01-18 DIAGNOSIS — F419 Anxiety disorder, unspecified: Secondary | ICD-10-CM

## 2015-01-18 DIAGNOSIS — F411 Generalized anxiety disorder: Secondary | ICD-10-CM

## 2015-01-18 DIAGNOSIS — F430 Acute stress reaction: Secondary | ICD-10-CM

## 2015-01-18 MED ORDER — CYCLOBENZAPRINE HCL 10 MG PO TABS: ORAL_TABLET | ORAL | Status: DC

## 2015-01-21 ENCOUNTER — Encounter: Payer: Self-pay | Admitting: Nurse Practitioner

## 2015-01-21 NOTE — Progress Notes (Signed)
Subjective:  Presents for recheck. Doing better dealing with stress at home. Has stopped Klonopin and Lexapro. Has been off Protonix for over 6 months. Reflux stable.   Objective:   BP 110/78 mmHg  Ht 5\' 6"  (1.676 m)  Wt 184 lb 6 oz (83.632 kg)  BMI 29.77 kg/m2  LMP 01/08/2015 NAD. Alert, oriented. Calm affect, much improved from previous visits. Lungs clear. Heart RRR. Abdomen soft, non tender.   Assessment:  Problem List Items Addressed This Visit      Digestive   GERD (gastroesophageal reflux disease) - Primary    Other Visit Diagnoses    Anxiety as acute reaction to exceptional stress          Plan:  Meds ordered this encounter  Medications  . cyclobenzaprine (FLEXERIL) 10 MG tablet    Sig: TAKE 1 TABLET 3 TIMES A DAY AS NEEDED MUSCLE SPASMS    Dispense:  30 tablet    Refill:  1    Order Specific Question:  Supervising Provider    Answer:  Merlyn AlbertLUKING, WILLIAM S [[id number]]   Hold on Lexapro, Klonopin and Protonix for now. Call back if any problems.  Return in about 6 months (around 07/21/2015) for recheck.

## 2015-06-16 ENCOUNTER — Other Ambulatory Visit: Payer: Self-pay | Admitting: Family Medicine

## 2015-06-21 ENCOUNTER — Telehealth: Payer: Self-pay | Admitting: Family Medicine

## 2015-06-21 DIAGNOSIS — Z13220 Encounter for screening for lipoid disorders: Secondary | ICD-10-CM

## 2015-06-21 DIAGNOSIS — Z79899 Other long term (current) drug therapy: Secondary | ICD-10-CM

## 2015-06-21 DIAGNOSIS — K219 Gastro-esophageal reflux disease without esophagitis: Secondary | ICD-10-CM

## 2015-06-21 NOTE — Telephone Encounter (Signed)
Pt is requesting lab orders to be sent over. Last labs per epic were cbc, lipid, hepatic and bmp on 11/11/14

## 2015-06-21 NOTE — Telephone Encounter (Signed)
Lipid, liver, metabolic 7 

## 2015-06-21 NOTE — Telephone Encounter (Signed)
Pt is requesting lab orders to be sent over for her upcoming appt. Last labs per epic were: cbc,lipid,hepatic,bmp on 11/11/14

## 2015-06-21 NOTE — Telephone Encounter (Signed)
Called patient and informed her per Dr.Scott Luking- Labs ordered and patient must be fasting before labs. Patient verbalized understanding.

## 2015-06-27 LAB — HEPATIC FUNCTION PANEL
ALBUMIN: 4.1 g/dL (ref 3.5–5.5)
ALK PHOS: 71 IU/L (ref 39–117)
ALT: 8 IU/L (ref 0–32)
AST: 18 IU/L (ref 0–40)
BILIRUBIN TOTAL: 0.5 mg/dL (ref 0.0–1.2)
Bilirubin, Direct: 0.12 mg/dL (ref 0.00–0.40)
Total Protein: 6.4 g/dL (ref 6.0–8.5)

## 2015-06-27 LAB — LIPID PANEL
Chol/HDL Ratio: 5.2 ratio units — ABNORMAL HIGH (ref 0.0–4.4)
Cholesterol, Total: 234 mg/dL — ABNORMAL HIGH (ref 100–199)
HDL: 45 mg/dL (ref >39)
LDL CALC: 169 mg/dL — AB (ref 0–99)
TRIGLYCERIDES: 100 mg/dL (ref 0–149)
VLDL CHOLESTEROL CAL: 20 mg/dL (ref 5–40)

## 2015-06-27 LAB — BASIC METABOLIC PANEL
BUN/Creatinine Ratio: 8 — ABNORMAL LOW (ref 9–23)
BUN: 6 mg/dL (ref 6–24)
CALCIUM: 8.9 mg/dL (ref 8.7–10.2)
CHLORIDE: 105 mmol/L (ref 97–106)
CO2: 22 mmol/L (ref 18–29)
Creatinine, Ser: 0.76 mg/dL (ref 0.57–1.00)
GFR, EST AFRICAN AMERICAN: 111 mL/min/{1.73_m2} (ref >59)
GFR, EST NON AFRICAN AMERICAN: 96 mL/min/{1.73_m2} (ref >59)
Glucose: 91 mg/dL (ref 65–99)
POTASSIUM: 3.9 mmol/L (ref 3.5–5.2)
SODIUM: 142 mmol/L (ref 136–144)

## 2015-07-05 ENCOUNTER — Encounter: Payer: Self-pay | Admitting: Nurse Practitioner

## 2015-07-05 ENCOUNTER — Ambulatory Visit (INDEPENDENT_AMBULATORY_CARE_PROVIDER_SITE_OTHER): Payer: BLUE CROSS/BLUE SHIELD | Admitting: Nurse Practitioner

## 2015-07-05 VITALS — BP 110/76 | Wt 191.5 lb

## 2015-07-05 DIAGNOSIS — M6248 Contracture of muscle, other site: Secondary | ICD-10-CM | POA: Diagnosis not present

## 2015-07-05 DIAGNOSIS — K2100 Gastro-esophageal reflux disease with esophagitis, without bleeding: Secondary | ICD-10-CM

## 2015-07-05 DIAGNOSIS — M62838 Other muscle spasm: Secondary | ICD-10-CM

## 2015-07-05 DIAGNOSIS — E785 Hyperlipidemia, unspecified: Secondary | ICD-10-CM | POA: Diagnosis not present

## 2015-07-05 MED ORDER — LORCASERIN HCL 10 MG PO TABS: ORAL_TABLET | ORAL | Status: DC

## 2015-07-05 MED ORDER — CYCLOBENZAPRINE HCL 10 MG PO TABS: ORAL_TABLET | ORAL | Status: DC

## 2015-07-05 MED ORDER — PANTOPRAZOLE SODIUM 40 MG PO TBEC: 40.0000 mg | DELAYED_RELEASE_TABLET | Freq: Every day | ORAL | Status: DC

## 2015-07-05 NOTE — Progress Notes (Signed)
Subjective:  Presents for routine follow up. Reflux stable on Protonix. Continues to have muscle spasms in the neck area. Takes occasional Flexeril. Still going through major stress at work; will lose her job in April. Has been stress eating including bacon, cheeseburgers and french fries. Patient attributes this to her elevated lipids. Would like to restart Belviq for weight loss.   Objective:   BP 110/76 mmHg  Wt 191 lb 8 oz (86.864 kg) NAD. Alert, oriented. Lungs clear. Heart RRR. Abdomen soft, non distended, non tender. LDL went from 107 to 146 to 169 over the past year.   Assessment:  Problem List Items Addressed This Visit      Digestive   GERD (gastroesophageal reflux disease) - Primary   Relevant Medications   pantoprazole (PROTONIX) 40 MG tablet     Musculoskeletal and Integument   Muscle spasms of neck (Chronic)     Other   Hyperlipidemia   Morbid obesity (HCC) (Chronic)   Relevant Medications   Lorcaserin HCl (BELVIQ) 10 MG TABS     Plan:  Meds ordered this encounter  Medications  . Lorcaserin HCl (BELVIQ) 10 MG TABS    Sig: One po BID for weight loss    Dispense:  60 tablet    Refill:  2    Order Specific Question:  Supervising Provider    Answer:  Merlyn AlbertLUKING, WILLIAM S [[id number]]  . cyclobenzaprine (FLEXERIL) 10 MG tablet    Sig: TAKE 1 TABLET 3 TIMES A DAY AS NEEDED MUSCLE SPASMS    Dispense:  30 tablet    Refill:  1    Order Specific Question:  Supervising Provider    Answer:  Merlyn AlbertLUKING, WILLIAM S [[id number]]  . pantoprazole (PROTONIX) 40 MG tablet    Sig: Take 1 tablet (40 mg total) by mouth daily.    Dispense:  30 tablet    Refill:  5    Order Specific Question:  Supervising Provider    Answer:  Merlyn AlbertLUKING, WILLIAM S [[id number]]   Repeat lipid profile in 6 months. Decrease saturated fat and cholesterol in diet.  Return in about 3 months (around 10/05/2015) for recheck.

## 2015-07-09 ENCOUNTER — Ambulatory Visit: Payer: BLUE CROSS/BLUE SHIELD | Admitting: Nurse Practitioner

## 2015-09-15 ENCOUNTER — Other Ambulatory Visit: Payer: Self-pay | Admitting: Nurse Practitioner

## 2015-10-05 ENCOUNTER — Encounter: Payer: Self-pay | Admitting: Nurse Practitioner

## 2015-10-05 ENCOUNTER — Ambulatory Visit (INDEPENDENT_AMBULATORY_CARE_PROVIDER_SITE_OTHER): Payer: BLUE CROSS/BLUE SHIELD | Admitting: Nurse Practitioner

## 2015-10-05 VITALS — BP 112/74 | Ht 67.0 in | Wt 196.0 lb

## 2015-10-05 DIAGNOSIS — E785 Hyperlipidemia, unspecified: Secondary | ICD-10-CM

## 2015-10-05 MED ORDER — NALTREXONE-BUPROPION HCL ER 8-90 MG PO TB12: ORAL_TABLET | ORAL | Status: DC

## 2015-10-06 LAB — LIPID PANEL
CHOL/HDL RATIO: 5.4 ratio — AB (ref 0.0–4.4)
Cholesterol, Total: 211 mg/dL — ABNORMAL HIGH (ref 100–199)
HDL: 39 mg/dL — AB (ref >39)
LDL Calculated: 145 mg/dL — ABNORMAL HIGH (ref 0–99)
Triglycerides: 134 mg/dL (ref 0–149)
VLDL CHOLESTEROL CAL: 27 mg/dL (ref 5–40)

## 2015-10-08 ENCOUNTER — Encounter: Payer: Self-pay | Admitting: Nurse Practitioner

## 2015-10-08 NOTE — Progress Notes (Signed)
Subjective:  Presents to discuss use of Belviq. Has had minimal weight loss for several months according to the chart. Also seems to have the side effect of calming her nerves. Denies any adverse effects. Patient is also due for a recheck on her lipid panel. No chest pain/ischemic type pain or shortness of breath.  Objective:   BP 112/74 mmHg  Ht  (1.702 m)  Wt 196 lb (88.905 kg)  BMI 30.69 kg/m2 NAD. Alert, oriented. Calm affect. Lungs clear. Heart regular rate rhythm. Lipid profile dated 06/26/2015 shows an LDL of 169 and total cholesterol 234.  Assessment:  Problem List Items Addressed This Visit      Other   Hyperlipidemia - Primary   Relevant Orders   Lipid panel (Completed)   Morbid obesity (HCC) (Chronic)   Relevant Medications   Naltrexone-Bupropion HCl ER (CONTRAVE) 8-90 MG TB12   Naltrexone-Bupropion HCl ER 8-90 MG TB12     Plan:  Meds ordered this encounter  Medications  . Naltrexone-Bupropion HCl ER (CONTRAVE) 8-90 MG TB12    Sig: 2 po BID    Dispense:  120 tablet    Refill:  1    Order Specific Question:  Supervising Provider    Answer:  Merlyn Albert [[id number]]  . Naltrexone-Bupropion HCl ER 8-90 MG TB12    Sig: One po qam x 1 week then one po BID x 1 week then 2 po qam and one po qpm x 1 week then 2 po BID    Dispense:  90 tablet    Refill:  0    Order Specific Question:  Supervising Provider    Answer:  Merlyn Albert [[id number]]   Discussed medication options for weight loss. Trial of contrave. Reviewed potential adverse effects. DC med and call if any problems. Strongly encourage healthy diet and regular activity. If no problems with new medication, recheck here in 3 months, call back sooner if any problems.

## 2015-10-12 ENCOUNTER — Telehealth: Payer: Self-pay | Admitting: Family Medicine

## 2015-10-12 NOTE — Telephone Encounter (Signed)
ERROR

## 2015-10-24 ENCOUNTER — Encounter: Payer: Self-pay | Admitting: Nurse Practitioner

## 2015-10-25 ENCOUNTER — Telehealth: Payer: Self-pay | Admitting: Family Medicine

## 2015-10-25 NOTE — Telephone Encounter (Signed)
Rx prior auth APPROVED for 2 different meds  Naltrexone-Bupropion HCl ER (CONTRAVE) 8-90 MG, valid 10/18/15-04/14/16 through [employer], ref#[id number]   Lorcaserin HCl (BELVIQ) 10 MG TABS, valid 10/04/15-10/02/16 through [employer], Ref# [id number]

## 2015-10-26 ENCOUNTER — Other Ambulatory Visit: Payer: Self-pay | Admitting: Nurse Practitioner

## 2015-10-26 MED ORDER — PHENTERMINE-TOPIRAMATE ER 3.75-23 MG PO CP24: ORAL_CAPSULE | ORAL | Status: DC

## 2015-10-26 MED ORDER — PHENTERMINE-TOPIRAMATE ER 7.5-46 MG PO CP24: ORAL_CAPSULE | ORAL | Status: DC

## 2016-01-04 ENCOUNTER — Ambulatory Visit (INDEPENDENT_AMBULATORY_CARE_PROVIDER_SITE_OTHER): Payer: BLUE CROSS/BLUE SHIELD | Admitting: Nurse Practitioner

## 2016-01-04 ENCOUNTER — Encounter: Payer: Self-pay | Admitting: Nurse Practitioner

## 2016-01-04 VITALS — BP 120/86 | Ht 67.0 in | Wt 192.1 lb

## 2016-01-04 DIAGNOSIS — K219 Gastro-esophageal reflux disease without esophagitis: Secondary | ICD-10-CM | POA: Diagnosis not present

## 2016-01-04 DIAGNOSIS — E785 Hyperlipidemia, unspecified: Secondary | ICD-10-CM

## 2016-01-05 ENCOUNTER — Encounter: Payer: Self-pay | Admitting: Nurse Practitioner

## 2016-01-05 NOTE — Progress Notes (Signed)
Subjective:  Presents for routine follow up. Did not start Qsymia until a few weeks ago due to some family issues. Mild nausea, no other side effects. Took first 14 days and has started the 7.5 mg dose. Medicine is very expensive even with voucher.  Limited activity. Will be losing her job and insurance at the end of July. Was off Protonix for several months but restarted recently due to exacerbation of symptoms.   Objective:   BP 120/86 mmHg  Ht 5\' 7"  (1.702 m)  Wt 192 lb 2 oz (87.147 kg)  BMI 30.08 kg/m2 NAD. Alert, oriented. Lungs clear. Heart RRR.   Assessment:  Problem List Items Addressed This Visit      Digestive   GERD (gastroesophageal reflux disease) - Primary     Other   Hyperlipidemia   Morbid obesity (HCC) (Chronic)     Plan: recommend labs and physical before the end of June. Increase activity with a goal of 20 min per day at least 4 days of the week. Return in about 6 months (around 07/06/2016) for recheck.

## 2016-01-08 ENCOUNTER — Other Ambulatory Visit: Payer: Self-pay | Admitting: Nurse Practitioner

## 2016-02-04 ENCOUNTER — Encounter: Payer: Self-pay | Admitting: Family Medicine

## 2016-02-04 ENCOUNTER — Ambulatory Visit (INDEPENDENT_AMBULATORY_CARE_PROVIDER_SITE_OTHER): Payer: BLUE CROSS/BLUE SHIELD | Admitting: Family Medicine

## 2016-02-04 VITALS — BP 100/72 | Temp 98.3°F | Ht 67.0 in | Wt 194.1 lb

## 2016-02-04 DIAGNOSIS — K5732 Diverticulitis of large intestine without perforation or abscess without bleeding: Secondary | ICD-10-CM

## 2016-02-04 MED ORDER — CIPROFLOXACIN HCL 500 MG PO TABS: 500.0000 mg | ORAL_TABLET | Freq: Two times a day (BID) | ORAL | Status: DC

## 2016-02-04 MED ORDER — METRONIDAZOLE 500 MG PO TABS: 500.0000 mg | ORAL_TABLET | Freq: Three times a day (TID) | ORAL | Status: DC

## 2016-02-04 NOTE — Progress Notes (Signed)
   Subjective:    Patient ID: Brandy Hensley, female    DOB: 1971/03/26, 45 y.o.   MRN: [id number]  Abdominal Pain This is a new problem. The current episode started in the past 7 days. The problem occurs intermittently. The problem has been unchanged. The pain is located in the generalized abdominal region. The pain is severe. The quality of the pain is aching. Associated symptoms comments: Chills, headache. Nothing aggravates the pain. The pain is relieved by nothing. Treatments tried: dulcolax. The treatment provided no relief.   History of right-sided diverticulitis nearly 20 years ago. Had colonoscopy then via Dr. Dionicia Ableraman  Has not had one since  No major urinary symptoms   Pt had serious mid abd pain  tookagent to have b m and things impproved  HA some, and feeling chills at time  No thermometer at home  Appetite is ok, slight nausea not bad  Eating ok   Review of Systems  Gastrointestinal: Positive for abdominal pain.  No back pain no headache no chest pain     Objective:   Physical Exam   Alert vital stable hydration good HEENT normal. Lungs clear. Heart rare rhythm. Abdomen hyperactive bowel sounds left lower quadrant discrete tenderness mild epigastric tenderness no rebound no guarding obvious pain     Assessment & Plan:  Impression acute diverticulitis. History has had this in the past 20 years ago. Right-sided at that time. No colonoscopy since. Plan antibiotics prescribed. Cipro and Flagyl. Warning signs discussed. Diet discussed. GI referral recommended likely needs another colonoscopy. Many questions answered 25 minutes spent most in discussion

## 2016-02-05 ENCOUNTER — Encounter: Payer: Self-pay | Admitting: Family Medicine

## 2016-02-05 ENCOUNTER — Encounter: Payer: BLUE CROSS/BLUE SHIELD | Admitting: Nurse Practitioner

## 2016-02-06 ENCOUNTER — Encounter: Payer: Self-pay | Admitting: Family Medicine

## 2016-02-11 ENCOUNTER — Encounter (INDEPENDENT_AMBULATORY_CARE_PROVIDER_SITE_OTHER): Payer: Self-pay | Admitting: Internal Medicine

## 2016-02-11 ENCOUNTER — Telehealth: Payer: Self-pay | Admitting: Family Medicine

## 2016-02-11 ENCOUNTER — Other Ambulatory Visit: Payer: Self-pay

## 2016-02-11 MED ORDER — AMOXICILLIN-POT CLAVULANATE 875-125 MG PO TABS: 1.0000 | ORAL_TABLET | Freq: Two times a day (BID) | ORAL | Status: DC

## 2016-02-11 MED ORDER — FLUCONAZOLE 150 MG PO TABS: 150.0000 mg | ORAL_TABLET | Freq: Every day | ORAL | Status: DC

## 2016-02-11 MED ORDER — ONDANSETRON 4 MG PO TBDP: 4.0000 mg | ORAL_TABLET | Freq: Four times a day (QID) | ORAL | Status: DC | PRN

## 2016-02-11 NOTE — Telephone Encounter (Signed)
Referral was sent through Epic to Dr. Patty Sermonsehman's office on 02/05/16, they will contact pt to schedule, letter was mailed to pt 02/05/16 explaining process & was given Dr. Patty Sermonsehman's office number to contact if she doesn't hear from them in a week or so

## 2016-02-11 NOTE — Telephone Encounter (Signed)
good

## 2016-02-11 NOTE — Telephone Encounter (Signed)
Aug 875 bid ten d, do not stop this or may run a rish of hospitalization, also rec zofran 4 odt twenty one qsixhrs prn for any possible nausea, how is gi referral progressing?

## 2016-02-11 NOTE — Telephone Encounter (Signed)
Notified patient Augmentin 875 bid ten days, do not stop this or may run a rish of hospitalization, also zofran 4 odt twenty one 1 tablet q six hours prn for any possible nausea. Med sent to pharmacy. Brendale please see question below.

## 2016-02-11 NOTE — Telephone Encounter (Signed)
Patient was taking Cipro and Flagyl for diverticulitis.

## 2016-02-11 NOTE — Telephone Encounter (Signed)
Pt called stating that she was seen last week and has stopped taking her antibiotic due to it making her nauseous. Pt wants to know what to do. Please advise.      [facility]

## 2016-02-22 ENCOUNTER — Ambulatory Visit (INDEPENDENT_AMBULATORY_CARE_PROVIDER_SITE_OTHER): Payer: Self-pay | Admitting: Internal Medicine

## 2016-02-25 ENCOUNTER — Ambulatory Visit (INDEPENDENT_AMBULATORY_CARE_PROVIDER_SITE_OTHER): Payer: BLUE CROSS/BLUE SHIELD | Admitting: Nurse Practitioner

## 2016-02-25 ENCOUNTER — Encounter: Payer: Self-pay | Admitting: Nurse Practitioner

## 2016-02-25 VITALS — BP 110/70 | Ht 67.0 in | Wt 194.0 lb

## 2016-02-25 DIAGNOSIS — Z124 Encounter for screening for malignant neoplasm of cervix: Secondary | ICD-10-CM

## 2016-02-25 DIAGNOSIS — Z1231 Encounter for screening mammogram for malignant neoplasm of breast: Secondary | ICD-10-CM | POA: Diagnosis not present

## 2016-02-25 DIAGNOSIS — Z1151 Encounter for screening for human papillomavirus (HPV): Secondary | ICD-10-CM

## 2016-02-25 DIAGNOSIS — Z0000 Encounter for general adult medical examination without abnormal findings: Secondary | ICD-10-CM

## 2016-02-25 MED ORDER — FLUCONAZOLE 150 MG PO TABS: 150.0000 mg | ORAL_TABLET | Freq: Every day | ORAL | Status: DC

## 2016-02-25 MED ORDER — LORCASERIN HCL 10 MG PO TABS: ORAL_TABLET | ORAL | Status: DC

## 2016-02-25 NOTE — Progress Notes (Signed)
   Subjective:    Patient ID: Brandy Hensley, female    DOB: [birth year], [age] y.o.   MRN: [id number]  HPI presents for her wellness exam. No new sexual partners. Irregular menses. Sometimes 2 per month. Last cycle 5/19. Regular vision and dental exams. Smokes one ppd.     Review of Systems  Constitutional: Negative for activity change, appetite change and fatigue.  HENT: Negative for dental problem, ear pain, sinus pressure and sore throat.   Respiratory: Negative for cough, chest tightness, shortness of breath and wheezing.   Cardiovascular: Negative for chest pain.  Gastrointestinal: Negative for nausea, vomiting, abdominal pain, diarrhea, constipation, blood in stool and abdominal distention.  Genitourinary: Positive for vaginal discharge and menstrual problem. Negative for dysuria, urgency, frequency, enuresis, difficulty urinating, genital sores and pelvic pain.       Recent yeast infection after antibiotics. Completed 2 doses of Diflucan; better but still has come discharge       Objective:   Physical Exam  Constitutional: She is oriented to person, place, and time. She appears well-developed. No distress.  HENT:  Right Ear: External ear normal.  Left Ear: External ear normal.  Mouth/Throat: Oropharynx is clear and moist.  Neck: Normal range of motion. Neck supple. No tracheal deviation present. No thyromegaly present.  Cardiovascular: Normal rate, regular rhythm and normal heart sounds.  Exam reveals no gallop.   No murmur heard. Pulmonary/Chest: Effort normal and breath sounds normal.  Abdominal: Soft. She exhibits no distension. There is no tenderness.  Genitourinary: Vagina normal and uterus normal. No vaginal discharge found.  External GU: no rashes or lesions. Vagina: small amount of white discharge. Cervix normal in appearance. No CMT. Bimanual exam: no tenderness or obvious masses.    Musculoskeletal: She exhibits no edema.  Lymphadenopathy:    She has no cervical  adenopathy.  Neurological: She is alert and oriented to person, place, and time.  Skin: Skin is warm and dry. No rash noted.  Psychiatric: She has a normal mood and affect. Her behavior is normal.  Vitals reviewed. Breast exam: dense tissue; no masses; axillae no adenopathy.         Assessment & Plan:  Routine general medical examination at a health care facility - Plan: Pap IG and HPV (high risk) DNA detection  Screening for cervical cancer - Plan: Pap IG and HPV (high risk) DNA detection  Screening for HPV (human papillomavirus) - Plan: Pap IG and HPV (high risk) DNA detection  Visit for screening mammogram - Plan: MM DIGITAL SCREENING BILATERAL  Meds ordered this encounter  Medications  . Lorcaserin HCl (BELVIQ) 10 MG TABS    Sig: One po BID    Dispense:  60 tablet    Refill:  2    Order Specific Question:  Supervising Provider    Answer:  Merlyn AlbertLUKING, WILLIAM S [[id number]]  . fluconazole (DIFLUCAN) 150 MG tablet    Sig: Take 1 tablet (150 mg total) by mouth daily. 3 days apart    Dispense:  2 tablet    Refill:  0    Order Specific Question:  Supervising Provider    Answer:  Merlyn AlbertLUKING, WILLIAM S [[id number]]   Restart Belviq as directed. Recommend activity and continued weight loss efforts. Also daily vitamin D and calcium.  Return in about 1 year (around 02/24/2017) for physical.

## 2016-02-27 LAB — PAP IG AND HPV HIGH-RISK
HPV, high-risk: NEGATIVE
PAP Smear Comment: 0

## 2016-02-28 ENCOUNTER — Other Ambulatory Visit: Payer: Self-pay | Admitting: Nurse Practitioner

## 2016-02-28 ENCOUNTER — Ambulatory Visit (HOSPITAL_COMMUNITY)
Admission: RE | Admit: 2016-02-28 | Discharge: 2016-02-28 | Disposition: A | Discharge status: Home / Self Care | Payer: BLUE CROSS/BLUE SHIELD | Source: Ambulatory Visit | Attending: Nurse Practitioner | Admitting: Nurse Practitioner

## 2016-02-28 DIAGNOSIS — Z1231 Encounter for screening mammogram for malignant neoplasm of breast: Secondary | ICD-10-CM | POA: Insufficient documentation

## 2016-03-03 ENCOUNTER — Other Ambulatory Visit: Payer: Self-pay | Admitting: Nurse Practitioner

## 2016-03-03 ENCOUNTER — Other Ambulatory Visit (HOSPITAL_COMMUNITY)
Admission: RE | Admit: 2016-03-03 | Discharge: 2016-03-03 | Disposition: A | Discharge status: Home / Self Care | Payer: BLUE CROSS/BLUE SHIELD | Source: Ambulatory Visit | Attending: Nurse Practitioner | Admitting: Nurse Practitioner

## 2016-03-03 ENCOUNTER — Ambulatory Visit (INDEPENDENT_AMBULATORY_CARE_PROVIDER_SITE_OTHER): Payer: BLUE CROSS/BLUE SHIELD | Admitting: Nurse Practitioner

## 2016-03-03 ENCOUNTER — Encounter: Payer: Self-pay | Admitting: Nurse Practitioner

## 2016-03-03 ENCOUNTER — Ambulatory Visit (HOSPITAL_COMMUNITY)
Admission: RE | Admit: 2016-03-03 | Discharge: 2016-03-03 | Disposition: A | Discharge status: Home / Self Care | Payer: BLUE CROSS/BLUE SHIELD | Source: Ambulatory Visit | Attending: Nurse Practitioner | Admitting: Nurse Practitioner

## 2016-03-03 VITALS — BP 110/72 | Temp 98.5°F | Ht 67.0 in | Wt 197.2 lb

## 2016-03-03 DIAGNOSIS — R1011 Right upper quadrant pain: Secondary | ICD-10-CM | POA: Diagnosis not present

## 2016-03-03 DIAGNOSIS — R1031 Right lower quadrant pain: Secondary | ICD-10-CM | POA: Diagnosis present

## 2016-03-03 DIAGNOSIS — K5730 Diverticulosis of large intestine without perforation or abscess without bleeding: Secondary | ICD-10-CM | POA: Insufficient documentation

## 2016-03-03 LAB — POCT URINALYSIS DIPSTICK
Spec Grav, UA: 1.015
pH, UA: 5

## 2016-03-03 LAB — CBC WITH DIFFERENTIAL/PLATELET
Basophils Absolute: 0 10*3/uL (ref 0.0–0.1)
Basophils Relative: 0 %
Eosinophils Absolute: 0.2 10*3/uL (ref 0.0–0.7)
Eosinophils Relative: 2 %
HCT: 36.6 % (ref 36.0–46.0)
HEMOGLOBIN: 12.1 g/dL (ref 12.0–15.0)
LYMPHS ABS: 1.9 10*3/uL (ref 0.7–4.0)
LYMPHS PCT: 21 %
MCH: 30.8 pg (ref 26.0–34.0)
MCHC: 33.1 g/dL (ref 30.0–36.0)
MCV: 93.1 fL (ref 78.0–100.0)
Monocytes Absolute: 0.8 10*3/uL (ref 0.1–1.0)
Monocytes Relative: 9 %
NEUTROS PCT: 68 %
Neutro Abs: 6.1 10*3/uL (ref 1.7–7.7)
Platelets: 152 10*3/uL (ref 150–400)
RBC: 3.93 MIL/uL (ref 3.87–5.11)
RDW: 12.8 % (ref 11.5–15.5)
WBC: 9.1 10*3/uL (ref 4.0–10.5)

## 2016-03-03 LAB — BASIC METABOLIC PANEL
Anion gap: 5 (ref 5–15)
BUN: 6 mg/dL (ref 6–20)
CHLORIDE: 106 mmol/L (ref 101–111)
CO2: 25 mmol/L (ref 22–32)
Calcium: 8.3 mg/dL — ABNORMAL LOW (ref 8.9–10.3)
Creatinine, Ser: 0.77 mg/dL (ref 0.44–1.00)
GFR calc non Af Amer: >60 mL/min (ref >60)
Glucose, Bld: 93 mg/dL (ref 65–99)
POTASSIUM: 3.5 mmol/L (ref 3.5–5.1)
SODIUM: 136 mmol/L (ref 135–145)

## 2016-03-03 LAB — HEPATIC FUNCTION PANEL
ALK PHOS: 89 U/L (ref 38–126)
ALT: 12 U/L — AB (ref 14–54)
AST: 17 U/L (ref 15–41)
Albumin: 3.6 g/dL (ref 3.5–5.0)
BILIRUBIN DIRECT: 0.1 mg/dL (ref 0.1–0.5)
BILIRUBIN INDIRECT: 0.9 mg/dL (ref 0.3–0.9)
BILIRUBIN TOTAL: 1 mg/dL (ref 0.3–1.2)
Total Protein: 6.7 g/dL (ref 6.5–8.1)

## 2016-03-03 LAB — LIPASE, BLOOD: Lipase: 19 U/L (ref 11–51)

## 2016-03-03 MED ORDER — METRONIDAZOLE 500 MG PO TABS: 500.0000 mg | ORAL_TABLET | Freq: Three times a day (TID) | ORAL | Status: DC

## 2016-03-03 MED ORDER — IOPAMIDOL (ISOVUE-300) INJECTION 61%
100.0000 mL | Freq: Once | INTRAVENOUS | Status: AC | PRN
  Administered 2016-03-03: 100 mL via INTRAVENOUS

## 2016-03-03 MED ORDER — CIPROFLOXACIN HCL 500 MG PO TABS: 500.0000 mg | ORAL_TABLET | Freq: Two times a day (BID) | ORAL | Status: DC

## 2016-03-03 NOTE — Progress Notes (Signed)
Subjective:  Presents for c/o right mid abd pain that began yesterday. Near the right anterior flank area. Progressive. Constant ache with sharp pains and burning sensation. Was seen for diverticulitis on 6/12. States this pain is different. Had some left over antibiotics; has taken 2 doses of Cipro and 3 doses of Flagyl. Chills last night, no documented fever. No diarrhea or constipation. Started cycle yesterday. No change in color of stools. No dysuria, urgency or pelvic pain. No new sexual partners. Had pelvic exam on 7/3.   Objective:   BP 110/72 mmHg  Temp(Src) 98.5 F (36.9 C) (Oral)  Ht 5\' 7"  (1.702 m)  Wt 197 lb 4 oz (89.472 kg)  BMI 30.89 kg/m2  LMP 01/11/2016 NAD. Alert, oriented. Lungs clear. No CVA tenderness. Heart RRR. Abdomen soft, non distended with normal BS x 4. Localized right mid abd tenderness noted beginning near the anterior axillary line to posterior axillary line. No masses. Guarding noted. Minimal lower abd tenderness.  Results for orders placed or performed in visit on 03/03/16  POCT urinalysis dipstick  Result Value Ref Range   Color, UA Yellow    Clarity, UA     Glucose, UA     Bilirubin, UA     Ketones, UA     Spec Grav, UA 1.015    Blood, UA Large    pH, UA 5.0    Protein, UA     Urobilinogen, UA     Nitrite, UA     Leukocytes, UA large (3+) (A) Negative   Urine micro: occas WBC; RBC TNTC (on menses)  Assessment: Right upper quadrant pain - Plan: CT Abdomen Pelvis Wo Contrast, CBC with Differential/Platelet, Hepatic function panel, Basic metabolic panel, Lipase, CT Abdomen Pelvis W Contrast, CANCELED: CBC with Differential/Platelet, CANCELED: Hepatic function panel, CANCELED: Basic metabolic panel, CANCELED: Lipase, CANCELED: Lipase  Right lower quadrant abdominal pain - Plan: POCT urinalysis dipstick, CT Abdomen Pelvis Wo Contrast, CBC with Differential/Platelet, Hepatic function panel, Basic metabolic panel, Lipase, CT Abdomen Pelvis W Contrast,  CANCELED: CBC with Differential/Platelet, CANCELED: Hepatic function panel, CANCELED: Basic metabolic panel, CANCELED: Lipase, CANCELED: Lipase  Plan: stat labs and scan pending. Further follow up based on results.

## 2016-03-03 NOTE — Addendum Note (Signed)
Addended by: Theodora BlowREWS, SHANNON R on: 03/03/2016 05:04 PM   Modules accepted: Orders

## 2016-03-04 ENCOUNTER — Ambulatory Visit (HOSPITAL_COMMUNITY): Payer: BLUE CROSS/BLUE SHIELD

## 2016-03-04 ENCOUNTER — Encounter: Payer: Self-pay | Admitting: Family Medicine

## 2016-03-12 ENCOUNTER — Encounter (INDEPENDENT_AMBULATORY_CARE_PROVIDER_SITE_OTHER): Payer: Self-pay | Admitting: Internal Medicine

## 2016-03-14 ENCOUNTER — Ambulatory Visit (INDEPENDENT_AMBULATORY_CARE_PROVIDER_SITE_OTHER): Payer: BLUE CROSS/BLUE SHIELD | Admitting: Nurse Practitioner

## 2016-03-14 ENCOUNTER — Telehealth: Payer: Self-pay | Admitting: Family Medicine

## 2016-03-14 ENCOUNTER — Encounter: Payer: Self-pay | Admitting: Nurse Practitioner

## 2016-03-14 VITALS — BP 120/76 | Ht 67.0 in | Wt 195.6 lb

## 2016-03-14 DIAGNOSIS — F410 Panic disorder [episodic paroxysmal anxiety] without agoraphobia: Secondary | ICD-10-CM | POA: Diagnosis not present

## 2016-03-14 DIAGNOSIS — R079 Chest pain, unspecified: Secondary | ICD-10-CM | POA: Diagnosis not present

## 2016-03-14 DIAGNOSIS — K2100 Gastro-esophageal reflux disease with esophagitis, without bleeding: Secondary | ICD-10-CM

## 2016-03-14 MED ORDER — SUCRALFATE 1 G PO TABS: 1.0000 g | ORAL_TABLET | Freq: Three times a day (TID) | ORAL | Status: DC

## 2016-03-14 MED ORDER — CLONAZEPAM 0.5 MG PO TABS: ORAL_TABLET | ORAL | Status: DC

## 2016-03-14 NOTE — Telephone Encounter (Signed)
Pt called stating that she has a few more days of antibiotics and is now having a constant dull ache in her neck and back and is feeling at times like she cant get a good breath. Pt is wanting a nurse to call her back. Please advise,

## 2016-03-14 NOTE — Telephone Encounter (Signed)
Patient transferred to front desk to schedule appointment for today.  

## 2016-03-17 ENCOUNTER — Encounter: Payer: Self-pay | Admitting: Nurse Practitioner

## 2016-03-17 NOTE — Progress Notes (Signed)
Subjective:  Presents for c/o episodes of difficulty breathing. Unassociated with activity. Feels like she cannot get enough air; then becomes lightheaded and increased pounding heart rate. Patient will lose her job and insurance at the end of next week. Feels a flutter against the side of her chest only when she lies down. Abdominal pain better. Better with sitting up; belching constantly; burning in chest and back at times. Having dull headaches with pressure in the neck and back of the head which intensifies with the breathing episodes. No fever, cough or wheezing. No nausea, vomiting or diarrhea. Taking Protonix for GERD.  Objective:   BP 120/76   Ht 5\' 7"  (1.702 m)   Wt 195 lb 9.6 oz (88.7 kg)   LMP 01/11/2016   BMI 30.64 kg/m  NAD. Alert, oriented. Mildly anxious affect. Lungs clear. Heart RRR. Rate 96. EKG normal. Abdomen soft, non distended with moderate epigastric area tenderness, otherwise benign.   Assessment:  Problem List Items Addressed This Visit      Digestive   GERD (gastroesophageal reflux disease)   Relevant Medications   sucralfate (CARAFATE) 1 g tablet    Other Visit Diagnoses    Chest pain, unspecified chest pain type    -  Primary   Relevant Orders   PR ELECTROCARDIOGRAM, COMPLETE   Panic attacks         Plan:  Meds ordered this encounter  Medications  . clonazePAM (KLONOPIN) 0.5 MG tablet    Sig: 1/2-1 po BID prn anxiety    Dispense:  30 tablet    Refill:  0    Order Specific Question:   Supervising Provider    Answer:   Merlyn Albert [[id number]]  . sucralfate (CARAFATE) 1 g tablet    Sig: Take 1 tablet (1 g total) by mouth 4 (four) times daily -  with meals and at bedtime.    Dispense:  40 tablet    Refill:  0    Order Specific Question:   Supervising Provider    Answer:   Merlyn Albert [[id number]]   Lengthy discussion about anxiety and panic attacks with exacerbation of GERD. Trial of Klonopin; drowsiness precautions. Plan daily med if anxiety  persists. Add Carafate to regimen. Call back next week if no improvement. Warning signs reviewed. Call or go to ED sooner if worse.

## 2016-03-18 ENCOUNTER — Telehealth: Payer: Self-pay | Admitting: Family Medicine

## 2016-03-18 MED ORDER — PANTOPRAZOLE SODIUM 40 MG PO TBEC: DELAYED_RELEASE_TABLET | ORAL | 3 refills | Status: DC

## 2016-03-18 NOTE — Telephone Encounter (Signed)
Ok plus three ref 

## 2016-03-18 NOTE — Telephone Encounter (Signed)
Spoke with patient and informed her per Dr.Steve Luking- we are sending in refills on Protonix. 90 day supply with 3 refills. Patient verbalized understanding.

## 2016-03-18 NOTE — Telephone Encounter (Signed)
Pt would like 90 day Rx sent in for her pantoprazole (PROTONIX) 40 MG tablet  Pt is losing her job & her insurance will end Friday  [facility]

## 2016-03-19 ENCOUNTER — Encounter: Payer: Self-pay | Admitting: Family Medicine

## 2016-03-31 ENCOUNTER — Ambulatory Visit (INDEPENDENT_AMBULATORY_CARE_PROVIDER_SITE_OTHER): Payer: BLUE CROSS/BLUE SHIELD | Admitting: Internal Medicine

## 2016-04-03 ENCOUNTER — Encounter: Payer: Self-pay | Admitting: Nurse Practitioner

## 2016-04-25 ENCOUNTER — Other Ambulatory Visit: Payer: Self-pay | Admitting: Nurse Practitioner

## 2016-04-25 MED ORDER — CLONAZEPAM 0.5 MG PO TABS: ORAL_TABLET | ORAL | 0 refills | Status: DC

## 2016-05-30 ENCOUNTER — Other Ambulatory Visit: Payer: Self-pay | Admitting: Nurse Practitioner

## 2016-07-07 ENCOUNTER — Encounter: Payer: Self-pay | Admitting: Nurse Practitioner

## 2016-07-07 ENCOUNTER — Ambulatory Visit (INDEPENDENT_AMBULATORY_CARE_PROVIDER_SITE_OTHER): Payer: BLUE CROSS/BLUE SHIELD | Admitting: Nurse Practitioner

## 2016-07-07 ENCOUNTER — Ambulatory Visit (HOSPITAL_COMMUNITY)
Admission: RE | Admit: 2016-07-07 | Discharge: 2016-07-07 | Disposition: A | Discharge status: Home / Self Care | Payer: BLUE CROSS/BLUE SHIELD | Source: Ambulatory Visit | Attending: Nurse Practitioner | Admitting: Nurse Practitioner

## 2016-07-07 VITALS — BP 118/76 | Ht 67.0 in | Wt 192.0 lb

## 2016-07-07 DIAGNOSIS — B370 Candidal stomatitis: Secondary | ICD-10-CM

## 2016-07-07 DIAGNOSIS — R0600 Dyspnea, unspecified: Secondary | ICD-10-CM | POA: Diagnosis not present

## 2016-07-07 DIAGNOSIS — R0689 Other abnormalities of breathing: Secondary | ICD-10-CM | POA: Insufficient documentation

## 2016-07-07 DIAGNOSIS — K219 Gastro-esophageal reflux disease without esophagitis: Secondary | ICD-10-CM | POA: Diagnosis not present

## 2016-07-07 MED ORDER — FLUCONAZOLE 100 MG PO TABS: ORAL_TABLET | ORAL | 0 refills | Status: DC

## 2016-07-07 MED ORDER — ALBUTEROL SULFATE HFA 108 (90 BASE) MCG/ACT IN AERS
2.0000 | INHALATION_SPRAY | RESPIRATORY_TRACT | 0 refills | Status: DC | PRN
Start: 2016-07-07 — End: 2016-11-05

## 2016-07-07 MED ORDER — LORCASERIN HCL 10 MG PO TABS: ORAL_TABLET | ORAL | 2 refills | Status: DC

## 2016-07-07 NOTE — Progress Notes (Signed)
Subjective:  Presents for routine follow-up. Has not taken Klonopin in September when she started her new job. Continues to have some difficulty taking a deep breath, has had a cough for the past several months. Mainly worse in the mornings. Has been triggered by activity. Occasional mild wheeze. No fever or night sweats or weight loss. Continues to smoke one pack per day. Taking Belviq once daily for weight loss. Was having diarrhea during the summer due to stress, now has gone into a constipation mode. Having some breakthrough reflux symptoms on Protonix depending on what she eats. Minimal caffeine intake. At end of visit patient mentions she's had a yellowish white film on her tongue for the past several weeks. Minimally tender. No other oral lesions.  Objective:   BP 118/76   Ht 5\' 7"  (1.702 m)   Wt 192 lb (87.1 kg)   LMP 06/30/2016   BMI 30.07 kg/m  NAD. Alert, oriented. TMs clear effusion, no erythema. Pharynx clear. Supple with mild soft anterior adenopathy. Lungs clear. Frequent nonproductive cough noted especially with deep breath. Heart regular rate rhythm. Abdomen soft nondistended with distinct significant epigastric area tenderness and moderate generalized lower abdominal tenderness more towards the left lower quadrant. No obvious masses. Yellowish to white thin film noted over the tongue. No oral lesions noted around mucosa or underneath the tongue.  Assessment:  Problem List Items Addressed This Visit      Digestive   GERD (gastroesophageal reflux disease) - Primary     Other   Morbid obesity (HCC) (Chronic)   Relevant Medications   Lorcaserin HCl (BELVIQ) 10 MG TABS    Other Visit Diagnoses    Dyspnea and respiratory abnormality       Relevant Orders   DG Chest 2 View (Completed)   Oral candidiasis       Relevant Medications   fluconazole (DIFLUCAN) 100 MG tablet     Plan:  Meds ordered this encounter  Medications  . Lorcaserin HCl (BELVIQ) 10 MG TABS    Sig: One po  BID    Dispense:  60 tablet    Refill:  2    Order Specific Question:   Supervising Provider    Answer:   Merlyn AlbertLUKING, WILLIAM S [[id number]]  . albuterol (PROVENTIL HFA;VENTOLIN HFA) 108 (90 Base) MCG/ACT inhaler    Sig: Inhale 2 puffs into the lungs every 4 (four) hours as needed.    Dispense:  1 Inhaler    Refill:  0    Order Specific Question:   Supervising Provider    Answer:   Merlyn AlbertLUKING, WILLIAM S [[id number]]  . fluconazole (DIFLUCAN) 100 MG tablet    Sig: 2 po today then one po qd x 14 d    Dispense:  16 tablet    Refill:  0    Order Specific Question:   Supervising Provider    Answer:   Merlyn AlbertLUKING, WILLIAM S [[id number]]   Chest x-ray pending. Trial of albuterol to see if it will help her breathing. Patient to call back and keep us posted on how much this helps. May need pulmonary function test in the near future. Strongly recommend GI referral due to persistent issues, patient will think about this. Start Diflucan as directed for oral candidiasis, call back if persists. Discussed the importance of smoking cessation at length. Return in about 6 months (around 01/04/2017) for recheck.

## 2016-07-09 ENCOUNTER — Other Ambulatory Visit: Payer: Self-pay | Admitting: Nurse Practitioner

## 2016-07-09 MED ORDER — PANTOPRAZOLE SODIUM 40 MG PO TBEC: DELAYED_RELEASE_TABLET | ORAL | 0 refills | Status: DC

## 2016-09-16 ENCOUNTER — Encounter: Payer: Self-pay | Admitting: Family Medicine

## 2016-09-16 ENCOUNTER — Ambulatory Visit (INDEPENDENT_AMBULATORY_CARE_PROVIDER_SITE_OTHER): Payer: BLUE CROSS/BLUE SHIELD | Admitting: Family Medicine

## 2016-09-16 VITALS — BP 120/70 | Temp 98.1°F | Ht 67.0 in | Wt 199.2 lb

## 2016-09-16 DIAGNOSIS — J329 Chronic sinusitis, unspecified: Secondary | ICD-10-CM | POA: Diagnosis not present

## 2016-09-16 MED ORDER — AMOXICILLIN-POT CLAVULANATE 400-57 MG/5ML PO SUSR: ORAL | 0 refills | Status: DC

## 2016-09-16 NOTE — Progress Notes (Signed)
   Subjective:    Patient ID: Brandy Hensley, female    DOB: 1970/09/02, 46 y.o.   MRN: [id number]  Sinusitis  This is a new problem. The current episode started in the past 7 days. The problem is unchanged. There has been no fever. The pain is moderate. Associated symptoms include congestion, headaches, sinus pressure and a sore throat. Treatments tried: mucinex. The treatment provided no relief.   Started five d ago  Bad headache ot stat off  Pos facial pain , more so on the right   Worse with chang of position  Worse up top   Very impressive ha from the start  Review of Systems  HENT: Positive for congestion, sinus pressure and sore throat.   Neurological: Positive for headaches.       Objective:   Physical Exam Alert, mild malaise. Hydration good Vitals stable. frontal/ maxillary tenderness evident positive nasal congestion. pharynx normal neck supple  lungs clear/no crackles or wheezes. heart regular in rhythm        Assessment & Plan:  Impression rhinosinusitis likely post viral, discussed with patient. plan antibiotics prescribed. Questions answered. Symptomatic care discussed. warning signs discussed. WSL

## 2016-09-22 ENCOUNTER — Telehealth: Payer: Self-pay | Admitting: Family Medicine

## 2016-09-22 MED ORDER — FLUCONAZOLE 150 MG PO TABS: 150.0000 mg | ORAL_TABLET | Freq: Every day | ORAL | 0 refills | Status: DC

## 2016-09-22 MED ORDER — PREDNISONE 20 MG PO TABS: ORAL_TABLET | ORAL | 0 refills | Status: DC

## 2016-09-22 NOTE — Telephone Encounter (Signed)
Notified patient Augmentin still good choice for her, adult prednisone taper plus add ventolin two sprays q four hours, gargle salt water, diflucan will help any thrush. Med sent to pharmacy. Patient verbalized understanding.

## 2016-09-22 NOTE — Telephone Encounter (Signed)
Aug still good choice for her, adult pred taper plus add vent two sprays q four call in if she needs inhler rx, gargle salt water , diflucan will help any thrush

## 2016-09-22 NOTE — Telephone Encounter (Signed)
Pt is needing something called in for a yeast inf. Pt was prescribed Augmentin last week and has given her a yeast inf.   [facility]

## 2016-09-22 NOTE — Telephone Encounter (Signed)
Patient is having vaginal discharge and itching. Diflucan sent to pharmacy per protocol. Patient states that she has reddish spots on the back of her tongue. Burning in her mouth and chest area. Chest feels tighter with a slight cough. No wheezing, sob or fever noted. Currently prescribed Augmentin. Please advise.

## 2016-10-13 ENCOUNTER — Ambulatory Visit (INDEPENDENT_AMBULATORY_CARE_PROVIDER_SITE_OTHER): Payer: BLUE CROSS/BLUE SHIELD | Admitting: Family Medicine

## 2016-10-13 ENCOUNTER — Encounter: Payer: Self-pay | Admitting: Family Medicine

## 2016-10-13 VITALS — BP 120/70 | Temp 98.5°F | Ht 67.0 in | Wt 199.0 lb

## 2016-10-13 DIAGNOSIS — G8929 Other chronic pain: Secondary | ICD-10-CM

## 2016-10-13 DIAGNOSIS — R109 Unspecified abdominal pain: Secondary | ICD-10-CM

## 2016-10-13 DIAGNOSIS — K219 Gastro-esophageal reflux disease without esophagitis: Secondary | ICD-10-CM | POA: Diagnosis not present

## 2016-10-13 MED ORDER — HYOSCYAMINE SULFATE 0.125 MG SL SUBL: 0.1250 mg | SUBLINGUAL_TABLET | Freq: Four times a day (QID) | SUBLINGUAL | 0 refills | Status: DC | PRN

## 2016-10-13 MED ORDER — SUCRALFATE 1 G PO TABS: ORAL_TABLET | ORAL | 0 refills | Status: DC

## 2016-10-13 NOTE — Progress Notes (Signed)
   Subjective:    Patient ID: Brandy Hensley, female    DOB: 10-19-1970, 46 y.o.   MRN: [id number] Patient presents with complicated and progressive abdominal concerns. Abdominal Pain  This is a new problem. The current episode started 1 to 4 weeks ago. The problem occurs intermittently. The problem has been unchanged. The pain is located in the generalized abdominal region. The pain is moderate. The quality of the pain is aching. Nothing aggravates the pain. The pain is relieved by nothing. Treatments tried: gas x. The treatment provided no relief.   Patient has no other concerns at this time.   Over recent weeks has had stomach pain  Worsening  Belches a lot, bloating at times  No noticeable acid symtoms  Pt bloats up and sense that she cannot breathe, feels vry tight and sob  Get s mid epigastric pain very snsitive, and sore Not really a cramp  Not exercising, still smoking  Pos cholescystectomy hx  Review of notes reveals patient had a bout of presumed diverticulitis last summer. Was advised to get a gastroenterology referral. In fact this was process. Patient canceled it due to noninsured status.  Admits to a lot of stress in her personal and work life. Realizes this plays into things. Next  Has never been told in the past she has IBS but she certainly wonders   Low abd and low back pain  Review of Systems  Gastrointestinal: Positive for abdominal pain.       Objective:   Physical Exam Alert vital stable no acute distress lungs clear heart rare rhythm distinct epigastric tenderness no rebound no guarding bowel sounds good no discrete tenderness elsewhere       Assessment & Plan:  Impression #1 gastritis/reflux discussed. Patient Brandy Hensley on proton pump inhibitor. Will add Carafate rationale discussed. We'll also will do H. pylori #2 intermittent cramping like pain. With bloating. May well be related to IBS equivalent discussed #3 history of diverticulitis with no  colonoscopy for many years. Discussed with patient time to press on and get another one done plan 25 minutes spent greater than 50% easily and assessment in discussion with patient meds initiated as noted. GI referral initiated is no    + +

## 2016-10-15 LAB — H PYLORI, IGM, IGG, IGA AB: H pylori, IgM Abs: <9 units (ref 0.0–8.9)

## 2016-10-22 ENCOUNTER — Encounter (INDEPENDENT_AMBULATORY_CARE_PROVIDER_SITE_OTHER): Payer: Self-pay | Admitting: Internal Medicine

## 2016-11-04 ENCOUNTER — Ambulatory Visit (INDEPENDENT_AMBULATORY_CARE_PROVIDER_SITE_OTHER): Payer: BLUE CROSS/BLUE SHIELD | Admitting: Internal Medicine

## 2016-11-05 ENCOUNTER — Encounter (INDEPENDENT_AMBULATORY_CARE_PROVIDER_SITE_OTHER): Payer: Self-pay | Admitting: Internal Medicine

## 2016-11-05 ENCOUNTER — Ambulatory Visit (INDEPENDENT_AMBULATORY_CARE_PROVIDER_SITE_OTHER): Payer: BLUE CROSS/BLUE SHIELD | Admitting: Internal Medicine

## 2016-11-05 VITALS — BP 110/68 | HR 70 | Resp 18 | Ht 67.0 in | Wt 199.6 lb

## 2016-11-05 DIAGNOSIS — K589 Irritable bowel syndrome without diarrhea: Secondary | ICD-10-CM

## 2016-11-05 DIAGNOSIS — K219 Gastro-esophageal reflux disease without esophagitis: Secondary | ICD-10-CM

## 2016-11-05 MED ORDER — DICYCLOMINE HCL 10 MG PO CAPS: 10.0000 mg | ORAL_CAPSULE | Freq: Two times a day (BID) | ORAL | 3 refills | Status: DC

## 2016-11-05 NOTE — Progress Notes (Addendum)
Subjective:    Patient ID: Brandy Hensley, female    DOB: 07-26-1971, 46 y.o.   MRN: [id number]  HPI Referred by Dr Gerda Diss for chronic abdominal pain. She says for the past couple of months, she has had pain all over her abdomen.  She also has lower abdominal pain. The pain is not constant. She has had the pain for years. She says she is fine in the morning. As the day progresses, and she eats, she feels nauseated, bloated.  When she starts her period, the pain is excruciating .  She does not have lower abdominal pain every day. Usually her lower abdominal pain is related to her BMs.  She has a BM usually every day in the am. Stool are normal to mushy/loose.  Sometimes she may skip a day.  Sometimes she see mucous with her stools.  No melena or BRRB.     acid reflux. She does belch occasionally.  Her last colonoscopy was in 2005.  Underwent colonoscopy for possible diverticulitis. Her last GYN exam was in 6-8 months.  H. Pylori negative.  No NSAIDS.   03/03/2016 CT abdomen/pelvis with CM: rt lower quadrant pain: fever: IMPRESSION: 1. There is short segment inflammatory process in distal right colon with thickening of colonic wall stranding of pericolonic fat. This segment is best seen in sagittal image 65 measures about 3.6 cm length. Colonic diverticula are noted at this level. Findings are consistent with focal colitis or acute diverticulitis. No pericolonic abscess or perforation. 2. Normal appendix.  No pericecal inflammation. 3. No distal colitis or diverticulitis. 4. No small bowel obstruction.  Review of Systems Past Medical History:  Diagnosis Date  . Diverticulitis   . GERD (gastroesophageal reflux disease)   . PONV (postoperative nausea and vomiting)     Past Surgical History:  Procedure Laterality Date  . CHOLECYSTECTOMY  09/10/2012   Procedure: LAPAROSCOPIC CHOLECYSTECTOMY;  Surgeon: Dalia Heading, MD;  Location: [facility];  Service: General;  Laterality: N/A;  .  COLONOSCOPY  2005-2006   Dr.Rehman @[facility]   . TUBAL LIGATION      Allergies  Allergen Reactions  . Codeine Nausea And Vomiting    Skin on fire, Hot Flashes   . Contrave [Naltrexone-Bupropion Hcl Er] Palpitations  . Qsymia [Phentermine-Topiramate] Other (See Comments)    Tingling and muscle tightness    Current Outpatient Prescriptions on File Prior to Visit  Medication Sig Dispense Refill  . clonazePAM (KLONOPIN) 0.5 MG tablet TAKE 1/2 TO 1 TABLET BY MOUTH TWICE A DAY AS NEEDED FOR ANXIETY 30 tablet 2  . cyclobenzaprine (FLEXERIL) 10 MG tablet TAKE 1 TABLET BY MOUTH 3 TIMES A DAY AS NEEDED FOR MUSCLE SPASMS 30 tablet 1  . hyoscyamine (LEVSIN SL) 0.125 MG SL tablet Place 1 tablet (0.125 mg total) under the tongue every 6 (six) hours as needed. 30 tablet 0  . pantoprazole (PROTONIX) 40 MG tablet TAKE 1 TABLET TWICE A DAY X 1 MONTH 60 tablet 0  . sucralfate (CARAFATE) 1 g tablet Take 1 tablet AC and HS 60 tablet 0   No current facility-administered medications on file prior to visit.        Objective:   Physical Exam Blood pressure 110/68, pulse 70, resp. rate 18, height 5\' 7"  (1.702 m), weight 199 lb 9.6 oz (90.5 kg).   Alert and oriented. Skin warm and dry. Oral mucosa is moist.   . Sclera anicteric, conjunctivae is pink. Thyroid not enlarged. No cervical lymphadenopathy. Lungs clear. Heart  regular rate and rhythm.  Abdomen is soft. Bowel sounds are positive. No hepatomegaly. No abdominal masses felt. No tenderness.  No edema to lower extremities.        Assessment & Plan:  Lower abdominal pain. Bloating. Symptoms for years. This is not a diverticular flare.   IBS: Rx for Dicyclomine 10mg  BID.  Continue the Protonix BID after u finish the Dexilant. . Samples of Dexilant given to patient.  OV in 3 months.

## 2016-11-05 NOTE — Patient Instructions (Signed)
Samples of Dicyclomine 10mg  twice a day. Samples of Dexilant given to patient.  OV in 3 months.

## 2016-11-14 ENCOUNTER — Encounter (INDEPENDENT_AMBULATORY_CARE_PROVIDER_SITE_OTHER): Payer: Self-pay | Admitting: Internal Medicine

## 2016-11-17 ENCOUNTER — Telehealth (INDEPENDENT_AMBULATORY_CARE_PROVIDER_SITE_OTHER): Payer: Self-pay | Admitting: *Deleted

## 2016-11-17 ENCOUNTER — Telehealth (INDEPENDENT_AMBULATORY_CARE_PROVIDER_SITE_OTHER): Payer: Self-pay | Admitting: Internal Medicine

## 2016-11-17 DIAGNOSIS — K219 Gastro-esophageal reflux disease without esophagitis: Secondary | ICD-10-CM

## 2016-11-17 MED ORDER — DEXLANSOPRAZOLE 60 MG PO CPDR: 60.0000 mg | DELAYED_RELEASE_CAPSULE | Freq: Every day | ORAL | 3 refills | Status: DC

## 2016-11-17 NOTE — Telephone Encounter (Signed)
Rx for Dicyclomine sent to her office

## 2016-11-17 NOTE — Telephone Encounter (Signed)
We rec'd a PA for Dexilant. Per Insurance the patient will need to have tried the Pantoprazole and the Omeprazole at least once, as they are the preferred on the the patient's formulary.  Patient was on the Pantoprazole 40 mg twice a day when she came in for her OV 11/05/2016. We have no documentation that the patient has been on Omeprazole.  Per Terri = Omeprazole 40 mg - take 1 by mouth everyday,30 minutes prior to breakfast. [place] [facility] [place] Patient was left a message.

## 2016-12-18 ENCOUNTER — Other Ambulatory Visit: Payer: Self-pay | Admitting: Nurse Practitioner

## 2016-12-18 NOTE — Telephone Encounter (Signed)
Last seen 10/13/16 

## 2017-01-06 ENCOUNTER — Ambulatory Visit: Payer: BLUE CROSS/BLUE SHIELD | Admitting: Nurse Practitioner

## 2017-01-15 ENCOUNTER — Ambulatory Visit (INDEPENDENT_AMBULATORY_CARE_PROVIDER_SITE_OTHER): Payer: BLUE CROSS/BLUE SHIELD | Admitting: Nurse Practitioner

## 2017-01-15 ENCOUNTER — Encounter: Payer: Self-pay | Admitting: Nurse Practitioner

## 2017-01-15 VITALS — BP 118/76 | Ht 67.0 in | Wt 200.4 lb

## 2017-01-15 DIAGNOSIS — K580 Irritable bowel syndrome with diarrhea: Secondary | ICD-10-CM

## 2017-01-15 DIAGNOSIS — M62838 Other muscle spasm: Secondary | ICD-10-CM

## 2017-01-15 DIAGNOSIS — K219 Gastro-esophageal reflux disease without esophagitis: Secondary | ICD-10-CM

## 2017-01-15 NOTE — Progress Notes (Signed)
Subjective:  Presents for routine follow-up. Was recently seen by GI specialist and diagnosed with IBS. Minimal improvement on Bentyl with some side effects so she stopped this medication. Dexilant require prior authorization so she started taking her pantoprazole again. The only advantage of the dexilant is that it stopped her burping. Continues to have frequent bloating gas and abdominal discomfort after eating. Was not happy with care provided at GI specialist. Has some Belviq at home which she plans to restart. This worked very well for her weight loss. Has also had a flareup of the muscle spasms in her neck over the past several days 1 day on the right lateral neck and another at the left cervical area which caused an intense dull headache.  Objective:   BP 118/76   Ht 5\' 7"  (1.702 m)   Wt 200 lb 6.4 oz (90.9 kg)   BMI 31.39 kg/m  NAD. Alert, oriented. Lungs clear. Heart regular rate rhythm. Abdomen soft nondistended with hyperactive bowel sounds; mild epigastric area tenderness and minimal generalized lower abdominal tenderness on exam. No rebound or guarding. No obvious masses. Extremely tight tender muscles noted along the trapezius and lateral neck area with some tightness noted in the left cervical area.  Assessment:   Problem List Items Addressed This Visit      Digestive   GERD (gastroesophageal reflux disease) - Primary   Irritable bowel syndrome with diarrhea     Musculoskeletal and Integument   Muscle spasms of neck (Chronic)       Plan:  Restart Belviq as directed. Continue current GI medications, patient defers referral to another GI specialist at this time. Massage therapy, ice/heat applications, neck exercises and Flexeril as directed for her neck pain. Also discussed factors such as her work environment that may be contributing to problem. Follow-up in July at her preventive health physical, call back sooner if any problems.

## 2017-02-05 ENCOUNTER — Ambulatory Visit (INDEPENDENT_AMBULATORY_CARE_PROVIDER_SITE_OTHER): Payer: BLUE CROSS/BLUE SHIELD | Admitting: Internal Medicine

## 2017-02-20 ENCOUNTER — Ambulatory Visit (INDEPENDENT_AMBULATORY_CARE_PROVIDER_SITE_OTHER): Payer: BLUE CROSS/BLUE SHIELD | Admitting: Nurse Practitioner

## 2017-02-20 ENCOUNTER — Encounter: Payer: Self-pay | Admitting: Nurse Practitioner

## 2017-02-20 VITALS — BP 100/88 | Temp 98.4°F | Ht 67.0 in | Wt 204.0 lb

## 2017-02-20 DIAGNOSIS — J0100 Acute maxillary sinusitis, unspecified: Secondary | ICD-10-CM

## 2017-02-20 MED ORDER — AZITHROMYCIN 250 MG PO TABS: ORAL_TABLET | ORAL | 0 refills | Status: DC

## 2017-02-20 MED ORDER — PREDNISONE 20 MG PO TABS: ORAL_TABLET | ORAL | 0 refills | Status: DC

## 2017-02-20 MED ORDER — METHYLPREDNISOLONE ACETATE 40 MG/ML IJ SUSP
40.0000 mg | Freq: Once | INTRAMUSCULAR | Status: AC
  Administered 2017-02-20: 40 mg via INTRAMUSCULAR

## 2017-02-20 NOTE — Progress Notes (Signed)
Subjective:  Presents for complaints of the head and chest cold for the past 3 days. No fever. Maxillary area sinus pressure radiating into the jaw. Sore throat. Clear runny nose. Frequent cough producing clear mucus. Slight ear pain. No actual wheezing but has used her OB roll inhaler 3 times a day for the past couple of days as a preventive measure. No vomiting diarrhea or abdominal pain.  Objective:   BP 100/88   Temp 98.4 F (36.9 C) (Oral)   Ht 5\' 7"  (1.702 m)   Wt 204 lb 0.2 oz (92.5 kg)   BMI 31.95 kg/m  NAD. Alert, oriented. TMs clear effusion, no erythema. Pharynx nonerythematous with slightly green PND noted. Neck supple with mild soft slightly tender anterior adenopathy. Lungs clear. Frequent cough noted. No wheezing or tachypnea. Normal color. Heart regular rate rhythm.  Assessment:  Acute non-recurrent maxillary sinusitis - Plan: methylPREDNISolone acetate (DEPO-MEDROL) injection 40 mg Probable viral URI   Plan:   Meds ordered this encounter  Medications  . predniSONE (DELTASONE) 20 MG tablet    Sig: 3 po qd x 3 d then 2 po qd x 3 d then 1 po qd x 2 d    Dispense:  17 tablet    Refill:  0    Order Specific Question:   Supervising Provider    Answer:   Merlyn AlbertLUKING, WILLIAM S [[id number]]  . azithromycin (ZITHROMAX Z-PAK) 250 MG tablet    Sig: Take 2 tablets (500 mg) on  Day 1,  followed by 1 tablet (250 mg) once daily on Days 2 through 5.    Dispense:  6 each    Refill:  0    Order Specific Question:   Supervising Provider    Answer:   Merlyn AlbertLUKING, WILLIAM S [[id number]]  . methylPREDNISolone acetate (DEPO-MEDROL) injection 40 mg   Given written prescription for prednisone to have on hand for the weekend in case of wheezing or worsening congestion. Explained the basic illness may still be viral and will need to run its course. Warning signs reviewed. Call back in 72 hours if no improvement, go to ED sooner if worse. OTC meds as directed for cough and congestion.

## 2017-02-20 NOTE — Patient Instructions (Signed)
Start Prednisone over the weekend if no improvement in sinus pressure or if wheezing Can start Saturday if needed.

## 2017-02-26 ENCOUNTER — Encounter: Payer: Self-pay | Admitting: Nurse Practitioner

## 2017-02-26 ENCOUNTER — Ambulatory Visit (INDEPENDENT_AMBULATORY_CARE_PROVIDER_SITE_OTHER): Payer: BLUE CROSS/BLUE SHIELD | Admitting: Nurse Practitioner

## 2017-02-26 VITALS — BP 132/86 | Ht 66.5 in | Wt 205.0 lb

## 2017-02-26 DIAGNOSIS — J45909 Unspecified asthma, uncomplicated: Secondary | ICD-10-CM | POA: Insufficient documentation

## 2017-02-26 DIAGNOSIS — E785 Hyperlipidemia, unspecified: Secondary | ICD-10-CM

## 2017-02-26 DIAGNOSIS — R5383 Other fatigue: Secondary | ICD-10-CM

## 2017-02-26 DIAGNOSIS — J4530 Mild persistent asthma, uncomplicated: Secondary | ICD-10-CM

## 2017-02-26 DIAGNOSIS — N951 Menopausal and female climacteric states: Secondary | ICD-10-CM | POA: Diagnosis not present

## 2017-02-26 DIAGNOSIS — Z1231 Encounter for screening mammogram for malignant neoplasm of breast: Secondary | ICD-10-CM | POA: Diagnosis not present

## 2017-02-26 DIAGNOSIS — Z0000 Encounter for general adult medical examination without abnormal findings: Secondary | ICD-10-CM

## 2017-02-26 DIAGNOSIS — N926 Irregular menstruation, unspecified: Secondary | ICD-10-CM | POA: Diagnosis not present

## 2017-02-26 MED ORDER — FLUTICASONE PROPIONATE HFA 44 MCG/ACT IN AERO: 2.0000 | INHALATION_SPRAY | Freq: Two times a day (BID) | RESPIRATORY_TRACT | 12 refills | Status: DC

## 2017-02-26 NOTE — Progress Notes (Signed)
Subjective:    Patient ID: Brandy Hensley, female    DOB: Mar 05, 1971, 46 y.o.   MRN: [id number]  HPI presents for her wellness exam. Irregular cycles, heavy flow at times. Lasts about 7 days. No new sexual partners. Limited activity. Has not restarted Belviq. Regular dental care and eye exams. Having some wheezing at times. Using her albuterol about twice a day. Slightly improved with recent use of Prednisone.  Continues to smoke about 1 ppd. Does not feel she can quit at this time.     Review of Systems  Constitutional: Positive for fatigue. Negative for activity change, appetite change and fever.  HENT: Positive for postnasal drip. Negative for dental problem, ear pain, sinus pressure and sore throat.   Respiratory: Positive for cough and wheezing. Negative for chest tightness and shortness of breath.   Cardiovascular: Negative for chest pain.  Gastrointestinal: Negative for abdominal distention, abdominal pain, constipation, diarrhea, nausea and vomiting.  Genitourinary: Positive for menstrual problem. Negative for difficulty urinating, dysuria, enuresis, frequency, genital sores, pelvic pain, urgency and vaginal discharge.       Objective:   Physical Exam  Constitutional: She is oriented to person, place, and time. She appears well-developed. No distress.  HENT:  Right Ear: External ear normal.  Left Ear: External ear normal.  Mouth/Throat: Oropharynx is clear and moist.  Neck: Normal range of motion. Neck supple. No tracheal deviation present. No thyromegaly present.  Cardiovascular: Normal rate, regular rhythm and normal heart sounds.  Exam reveals no gallop.   No murmur heard. Pulmonary/Chest: Effort normal. No respiratory distress. She has wheezes. She has no rales.  Very faint scattered expiratory wheeze. No tachypnea. No indication for neb treatment. Good air flow.   Abdominal: Soft. She exhibits no distension. There is no tenderness.  Genitourinary: Vagina normal and uterus  normal. No vaginal discharge found.  Genitourinary Comments: External GU: slightly pale and dry. No lesions. Vagina: no discharge. Cervix normal in appearance. No CMT. Bimanual exam: no tenderness or obvious masses.   Musculoskeletal: She exhibits no edema.  Lymphadenopathy:    She has no cervical adenopathy.  Neurological: She is alert and oriented to person, place, and time.  Skin: Skin is warm and dry. No rash noted.  Psychiatric: She has a normal mood and affect. Her behavior is normal.  Vitals reviewed. Breast exam: no masses; axillae no adenopathy.         Assessment & Plan:   Problem List Items Addressed This Visit      Respiratory   Reactive airway disease     Other   Hyperlipidemia   Relevant Orders   Lipid panel   Irregular menses   Perimenopause    Other Visit Diagnoses    Routine general medical examination at a health care facility    -  Primary   Relevant Orders   Lipid panel   Hepatic function panel   TSH   VITAMIN D 25 Hydroxy (Vit-D Deficiency, Fractures)   Basic metabolic panel   MM DIGITAL SCREENING BILATERAL   Encounter for screening mammogram for breast cancer       Relevant Orders   MM DIGITAL SCREENING BILATERAL   Other fatigue       Relevant Orders   Hepatic function panel   TSH   VITAMIN D 25 Hydroxy (Vit-D Deficiency, Fractures)   Basic metabolic panel     Meds ordered this encounter  Medications  . fluticasone (FLOVENT HFA) 44 MCG/ACT inhaler    Sig:  Inhale 2 puffs into the lungs 2 (two) times daily.    Dispense:  1 Inhaler    Refill:  12    Order Specific Question:   Supervising Provider    Answer:   Merlyn AlbertLUKING, WILLIAM S [[id number]]   Add Flovent to regimena. Continue albuterol as rescue inhaler. Patient verbalizes understanding. Call back in 2-3 weeks if no improvement, sooner if worse.  Encouraged regular activity and weight loss. Discussed hormonal options for cycle. Recommend a cycle at least once every 3 months. Discussed rationale.  Defers intervention at this time.  Return in about 6 months (around 08/29/2017) for recheck.

## 2017-02-28 ENCOUNTER — Encounter: Payer: Self-pay | Admitting: Nurse Practitioner

## 2017-02-28 LAB — TSH: TSH: 0.969 u[IU]/mL (ref 0.450–4.500)

## 2017-02-28 LAB — BASIC METABOLIC PANEL
BUN/Creatinine Ratio: 17 (ref 9–23)
BUN: 13 mg/dL (ref 6–24)
CALCIUM: 9.2 mg/dL (ref 8.7–10.2)
CO2: 23 mmol/L (ref 20–29)
CREATININE: 0.78 mg/dL (ref 0.57–1.00)
Chloride: 102 mmol/L (ref 96–106)
GFR calc Af Amer: 106 mL/min/{1.73_m2} (ref >59)
GFR, EST NON AFRICAN AMERICAN: 92 mL/min/{1.73_m2} (ref >59)
Glucose: 95 mg/dL (ref 65–99)
Potassium: 3.5 mmol/L (ref 3.5–5.2)
Sodium: 141 mmol/L (ref 134–144)

## 2017-02-28 LAB — LIPID PANEL
CHOL/HDL RATIO: 4.5 ratio — AB (ref 0.0–4.4)
Cholesterol, Total: 215 mg/dL — ABNORMAL HIGH (ref 100–199)
HDL: 48 mg/dL (ref >39)
LDL Calculated: 129 mg/dL — ABNORMAL HIGH (ref 0–99)
Triglycerides: 191 mg/dL — ABNORMAL HIGH (ref 0–149)
VLDL Cholesterol Cal: 38 mg/dL (ref 5–40)

## 2017-02-28 LAB — HEPATIC FUNCTION PANEL
ALT: 9 IU/L (ref 0–32)
AST: 12 IU/L (ref 0–40)
Albumin: 4.2 g/dL (ref 3.5–5.5)
Alkaline Phosphatase: 80 IU/L (ref 39–117)
BILIRUBIN TOTAL: 0.4 mg/dL (ref 0.0–1.2)
BILIRUBIN, DIRECT: 0.12 mg/dL (ref 0.00–0.40)
TOTAL PROTEIN: 6.3 g/dL (ref 6.0–8.5)

## 2017-02-28 LAB — VITAMIN D 25 HYDROXY (VIT D DEFICIENCY, FRACTURES): Vit D, 25-Hydroxy: 19.7 ng/mL — ABNORMAL LOW (ref 30.0–100.0)

## 2017-03-02 ENCOUNTER — Encounter: Payer: Self-pay | Admitting: Nurse Practitioner

## 2017-03-02 ENCOUNTER — Other Ambulatory Visit: Payer: Self-pay | Admitting: Nurse Practitioner

## 2017-03-02 ENCOUNTER — Encounter: Payer: Self-pay | Admitting: Family Medicine

## 2017-03-02 ENCOUNTER — Ambulatory Visit (INDEPENDENT_AMBULATORY_CARE_PROVIDER_SITE_OTHER): Payer: BLUE CROSS/BLUE SHIELD | Admitting: Nurse Practitioner

## 2017-03-02 ENCOUNTER — Telehealth: Payer: Self-pay | Admitting: *Deleted

## 2017-03-02 ENCOUNTER — Other Ambulatory Visit: Payer: Self-pay | Admitting: *Deleted

## 2017-03-02 ENCOUNTER — Ambulatory Visit (HOSPITAL_COMMUNITY)
Admission: RE | Admit: 2017-03-02 | Discharge: 2017-03-02 | Disposition: A | Discharge status: Home / Self Care | Payer: BLUE CROSS/BLUE SHIELD | Source: Ambulatory Visit | Attending: Nurse Practitioner | Admitting: Nurse Practitioner

## 2017-03-02 VITALS — BP 104/70 | Temp 98.1°F | Ht 66.5 in | Wt 206.1 lb

## 2017-03-02 DIAGNOSIS — J4541 Moderate persistent asthma with (acute) exacerbation: Secondary | ICD-10-CM | POA: Insufficient documentation

## 2017-03-02 DIAGNOSIS — R911 Solitary pulmonary nodule: Secondary | ICD-10-CM

## 2017-03-02 DIAGNOSIS — K219 Gastro-esophageal reflux disease without esophagitis: Secondary | ICD-10-CM | POA: Diagnosis not present

## 2017-03-02 MED ORDER — CEFTRIAXONE SODIUM 1 G IJ SOLR
1.0000 g | Freq: Once | INTRAMUSCULAR | Status: AC
  Administered 2017-03-02: 1 g via INTRAMUSCULAR

## 2017-03-02 MED ORDER — ALBUTEROL SULFATE (2.5 MG/3ML) 0.083% IN NEBU
2.5000 mg | INHALATION_SOLUTION | Freq: Once | RESPIRATORY_TRACT | Status: AC
  Administered 2017-03-02: 2.5 mg via RESPIRATORY_TRACT

## 2017-03-02 MED ORDER — FLUCONAZOLE 150 MG PO TABS: ORAL_TABLET | ORAL | 0 refills | Status: DC

## 2017-03-02 MED ORDER — AMOXICILLIN-POT CLAVULANATE 875-125 MG PO TABS: 1.0000 | ORAL_TABLET | Freq: Two times a day (BID) | ORAL | 0 refills | Status: DC

## 2017-03-02 NOTE — Progress Notes (Signed)
Subjective:  Presents for recheck of her cough and congestion. Began using her Flovent inhaler on 7/5. Used it for 2 days. Felt like her throat was constricted with a "cottony" feeling. Patient discontinued using the inhaler and sensation went away. No fever. Cough has gotten worse over the past several days. Producing cloudy to slight yellow mucus. Persistent wheezing, using her albuterol inhaler about twice per day. Head congestion. Sore throat with cough. No headache or ear pain. Took some oral OTC meds for congestion yesterday, had Mentholatum as an ingredient which cause a sudden increase in her upper abdominal pain and reflux symptoms. No nausea vomiting. Also now having some mid back pain and lower chest wall pain especially with cough.  Objective:   BP 104/70   Temp 98.1 F (36.7 C) (Oral)   Ht 5' 6.5" (1.689 m)   Wt 206 lb 2 oz (93.5 kg)   SpO2 96%   BMI 32.77 kg/m  NAD. Alert, oriented. TMs retracted, more on the left. Non-erythematous. Pharynx erythema with green PND noted. Neck supple with mild soft anterior adenopathy. Occasional harsh congested bronchitic cough noted. No wheezing or tachypnea. Normal color. O2 sat 96%. Lungs initially diminished breath sounds in general, no wheezing or congestion noted. Given albuterol 2.5 mg nebulizer treatment. Airflow much improved with faint expiratory crackles noted in the left lower lobe. Heart regular rate rhythm. Abdomen soft nondistended with distinct upper epigastric area tenderness. Tenderness with palpation of the mid back area and the ribs along the lower costal margin anterior.  Assessment:   Problem List Items Addressed This Visit      Respiratory   Reactive airway disease - Primary   Relevant Medications   albuterol (PROVENTIL) (2.5 MG/3ML) 0.083% nebulizer solution 2.5 mg (Completed)   cefTRIAXone (ROCEPHIN) injection 1 g (Completed)   Other Relevant Orders   DG Chest 2 View     Digestive   GERD (gastroesophageal reflux  disease)       Plan:   Meds ordered this encounter  Medications  . albuterol (PROVENTIL) (2.5 MG/3ML) 0.083% nebulizer solution 2.5 mg  . cefTRIAXone (ROCEPHIN) injection 1 g    Order Specific Question:   Antibiotic Indication:    Answer:   Other Indication (list below)   Stat chest x-ray pending. Avoid OTC products with Mentholatum. Further intervention based on x-ray results.

## 2017-03-02 NOTE — Telephone Encounter (Signed)
Sent in antibiotic to start tomorrow and diflucan for yeast infection.

## 2017-03-02 NOTE — Telephone Encounter (Signed)
Discussed with pt. Pt verbalized understanding.  °

## 2017-03-02 NOTE — Telephone Encounter (Signed)
Pt wants rx for diflucan. Having some vaginal itching. I got ct scan scheduled aph for July 18th 5pm. Register 4:45pm. Pt wanted a call back after meds were sent. I will let her know then when her appt for ct scan is scheduled.

## 2017-03-03 ENCOUNTER — Telehealth: Payer: Self-pay | Admitting: Nurse Practitioner

## 2017-03-03 MED ORDER — ALBUTEROL SULFATE (2.5 MG/3ML) 0.083% IN NEBU: 2.5000 mg | INHALATION_SOLUTION | Freq: Four times a day (QID) | RESPIRATORY_TRACT | 1 refills | Status: DC | PRN

## 2017-03-03 NOTE — Telephone Encounter (Signed)
Patient is wanting to know if she should use something like Salon pas to relieve some of the pain in her back.  She thinks the pain is coming from her cough.  Also, she wants to know if she would benefit from another breathing treatment.  She has been coughing a lot today.  She does not feel quite as tight in her chest.  She does have a nebulizer at home she can use that is her mothers.  She wants to know if that would be an option.

## 2017-03-03 NOTE — Telephone Encounter (Signed)
Spoke with patient informed him per Dr.Steve Luking- you may use salon pas, we are also going to call in Albuterol 2.5/3. Patient verbalized understanding. Patient was on Prednisone taper and just finished her prednisone on yesterday. She saw Eber Jonesarolyn on yesterday and was prescribed Augmentin. Would you still like to repeat prednisone?

## 2017-03-03 NOTE — Telephone Encounter (Signed)
Spoke with patient and informed her per Dr.Steve Luking- we will hold off on prednisone. Patient verbalized understanding.

## 2017-03-03 NOTE — Telephone Encounter (Signed)
Can use salon pas, sure can use neb, call in albuterol 2.5 mg/ 3cc's if pt needs, also may want to take adult pred taper which can help open up airways and help chest wall pain too

## 2017-03-03 NOTE — Telephone Encounter (Signed)
Hold off then, and, Brandy Hensley noted sig improved post albuterol rx yest

## 2017-03-04 ENCOUNTER — Telehealth: Payer: Self-pay | Admitting: *Deleted

## 2017-03-04 ENCOUNTER — Other Ambulatory Visit: Payer: Self-pay | Admitting: *Deleted

## 2017-03-04 MED ORDER — CEFPROZIL 500 MG PO TABS: 500.0000 mg | ORAL_TABLET | Freq: Two times a day (BID) | ORAL | 0 refills | Status: DC

## 2017-03-04 NOTE — Telephone Encounter (Signed)
Stop aug, start cefzil 500 bid ten d, add otc probiotics will help with dirrhea

## 2017-03-04 NOTE — Telephone Encounter (Signed)
Patient called stating she has been taking antibiotics and she is having loose stools per patient today and yesterday her stools were really bad, patient thinks it may be coming from the antibiotic. Please advise [phone]

## 2017-03-04 NOTE — Telephone Encounter (Signed)
Left message to return to call

## 2017-03-04 NOTE — Telephone Encounter (Signed)
Pt states diarrhea is really bad. Every time she moves stools comes out. No abd pain, no blood in stool. Pt advised to stop augmentin. Start cefzil and probiotic. Follow up if not better tomorrow. ED if worse tonight.

## 2017-03-05 ENCOUNTER — Telehealth: Payer: Self-pay | Admitting: *Deleted

## 2017-03-05 NOTE — Telephone Encounter (Signed)
Pt states stools are dark but not black, no fever, consult with carolyn. Office visit tomorrow for recheck or ED if worse today. Pt verbalized understanding.

## 2017-03-05 NOTE — Telephone Encounter (Signed)
Patient called stating she is still experiencing black stools. Please advise

## 2017-03-06 ENCOUNTER — Encounter: Payer: Self-pay | Admitting: Nurse Practitioner

## 2017-03-06 ENCOUNTER — Ambulatory Visit (INDEPENDENT_AMBULATORY_CARE_PROVIDER_SITE_OTHER): Payer: BLUE CROSS/BLUE SHIELD | Admitting: Nurse Practitioner

## 2017-03-06 ENCOUNTER — Encounter: Payer: Self-pay | Admitting: Family Medicine

## 2017-03-06 VITALS — BP 100/70 | Ht 66.5 in | Wt 205.1 lb

## 2017-03-06 DIAGNOSIS — T3695XA Adverse effect of unspecified systemic antibiotic, initial encounter: Secondary | ICD-10-CM

## 2017-03-06 DIAGNOSIS — K521 Toxic gastroenteritis and colitis: Secondary | ICD-10-CM | POA: Diagnosis not present

## 2017-03-07 ENCOUNTER — Encounter: Payer: Self-pay | Admitting: Nurse Practitioner

## 2017-03-07 NOTE — Progress Notes (Signed)
Subjective:  Presents for c/o diarrhea that started after her third dose of Augmentin. Had uncontrollable runny stools with some incontinence. No black or bloody stools. No fever. No fever. Late yesterday diarrhea was much improved. Did not start Omnicef because of diarrhea. Has taken Augmentin in the past without difficulty. No abdominal pain. Unassociated with any particular food.   Objective:   BP 100/70   Ht 5' 6.5" (1.689 m)   Wt 205 lb 2 oz (93 kg)   BMI 32.61 kg/m  NAD. Alert, oriented. TMs mild clear effusion. Pharynx clear. Neck supple with minimal adenopathy. Lungs clear. Heart regular rate rhythm. Congestion and cough much improved from previous visit. Abdomen soft nondistended with mild epigastric tenderness and mild generalized lower abdominal tenderness. No rebound or guarding. No obvious masses.  Assessment:  Antibiotic-associated diarrhea    Plan:  Start daily bowel probiotic, given samples. Hold on Omnicef since symptoms are much improved. No further workup since diarrhea has basically resolved.  CT scan of the lungs is in process. Recommend referral to pulmonology, patient will consider.

## 2017-03-10 ENCOUNTER — Telehealth: Payer: Self-pay | Admitting: Nurse Practitioner

## 2017-03-10 ENCOUNTER — Telehealth: Payer: Self-pay | Admitting: *Deleted

## 2017-03-10 NOTE — Telephone Encounter (Signed)
Patient called and is upset her test was denied and cancelled-wants to know what to do next

## 2017-03-10 NOTE — Telephone Encounter (Signed)
CT chest scheduled for tomorrow 03/11/17 was denied. Aim stated that CT does not meet medical necessity for Radiology exam.

## 2017-03-10 NOTE — Telephone Encounter (Signed)
Brendale, help! This person has a nodule on Xray that needs CT scan per radiologist. Can you see if you can get PA to go through? Thanks!!! Brandy Hensley

## 2017-03-11 ENCOUNTER — Ambulatory Visit (HOSPITAL_COMMUNITY): Payer: BLUE CROSS/BLUE SHIELD

## 2017-03-11 ENCOUNTER — Ambulatory Visit (HOSPITAL_COMMUNITY)
Admission: RE | Admit: 2017-03-11 | Discharge: 2017-03-11 | Disposition: A | Discharge status: Home / Self Care | Payer: BLUE CROSS/BLUE SHIELD | Source: Ambulatory Visit | Attending: Nurse Practitioner | Admitting: Nurse Practitioner

## 2017-03-11 DIAGNOSIS — Z1231 Encounter for screening mammogram for malignant neoplasm of breast: Secondary | ICD-10-CM | POA: Diagnosis present

## 2017-03-15 ENCOUNTER — Other Ambulatory Visit: Payer: Self-pay | Admitting: Family Medicine

## 2017-03-16 ENCOUNTER — Encounter: Payer: Self-pay | Admitting: Nurse Practitioner

## 2017-04-22 NOTE — Telephone Encounter (Signed)
Prior auth obtained, scan scheduled, pt notified

## 2017-05-01 ENCOUNTER — Ambulatory Visit (HOSPITAL_COMMUNITY)
Admission: RE | Admit: 2017-05-01 | Discharge: 2017-05-01 | Disposition: A | Discharge status: Home / Self Care | Payer: BLUE CROSS/BLUE SHIELD | Source: Ambulatory Visit | Attending: Nurse Practitioner | Admitting: Nurse Practitioner

## 2017-05-01 DIAGNOSIS — J449 Chronic obstructive pulmonary disease, unspecified: Secondary | ICD-10-CM | POA: Insufficient documentation

## 2017-05-01 DIAGNOSIS — R911 Solitary pulmonary nodule: Secondary | ICD-10-CM | POA: Insufficient documentation

## 2017-05-01 DIAGNOSIS — I700 Atherosclerosis of aorta: Secondary | ICD-10-CM | POA: Diagnosis not present

## 2017-05-01 DIAGNOSIS — J439 Emphysema, unspecified: Secondary | ICD-10-CM | POA: Diagnosis not present

## 2017-06-09 ENCOUNTER — Encounter: Payer: Self-pay | Admitting: Family Medicine

## 2017-06-09 ENCOUNTER — Ambulatory Visit (INDEPENDENT_AMBULATORY_CARE_PROVIDER_SITE_OTHER): Payer: BLUE CROSS/BLUE SHIELD | Admitting: Family Medicine

## 2017-06-09 VITALS — BP 110/68 | Temp 98.2°F | Ht 66.5 in | Wt 210.0 lb

## 2017-06-09 DIAGNOSIS — J45901 Unspecified asthma with (acute) exacerbation: Secondary | ICD-10-CM

## 2017-06-09 DIAGNOSIS — J441 Chronic obstructive pulmonary disease with (acute) exacerbation: Secondary | ICD-10-CM

## 2017-06-09 MED ORDER — DOXYCYCLINE HYCLATE 100 MG PO TABS: 100.0000 mg | ORAL_TABLET | Freq: Two times a day (BID) | ORAL | 0 refills | Status: DC

## 2017-06-09 NOTE — Progress Notes (Signed)
   Subjective:    Patient ID: Brandy Hensley, female    DOB: [birth year], 46 y.o.   MRN: [id number]  HPIcough, wheezing, headache, sore throat. Started 2 days ago. Tried prednisone, mucinex, one ceprozil this morning, breathing treatments.    strted Sunday  Sneezing t first and stuffy  Dim enrgy  Did the teledoc thing  Saw [facility]oc, rxed five d of 60 mg prednisone  Woke up this norn with a bad cough  Did breathing rx, took pred,  Pt got chilled and we in the storm    cefzil took one this morn  Felt flushed, face redddning and flushed   Brandy Hensley as had sickness and cold   Used alb neb once this morn   Review of Systems No headache, no major weight loss or weight gain, no chest pain no back pain abdominal pain no change in bowel habits complete ROS otherwise negative     Objective:   Physical Exam Alert active good hydration moderate malaise. HEENT moderate nasal congestion lungs bilateralexpiratory wheezes slightly tachypnea bronchial cough intermittently       Assessment & Plan:  Impression exacerbation of COPD with significant element of reactive airways plan antibiotics prescribed steroids prescribed. Albuterol use encourage symptom care warning signs discussed

## 2017-06-11 ENCOUNTER — Other Ambulatory Visit: Payer: Self-pay | Admitting: *Deleted

## 2017-06-11 ENCOUNTER — Telehealth: Payer: Self-pay | Admitting: Family Medicine

## 2017-06-11 MED ORDER — FIRST-DUKES MOUTHWASH MT SUSP: OROMUCOSAL | 0 refills | Status: DC

## 2017-06-11 MED ORDER — FLUCONAZOLE 150 MG PO TABS: ORAL_TABLET | ORAL | 0 refills | Status: DC

## 2017-06-11 NOTE — Telephone Encounter (Signed)
meds sent to pharm. Pt notified.  

## 2017-06-11 NOTE — Telephone Encounter (Signed)
Patient here on Tuesday and got an antibiotic.  Needs D[facility]lucan [place] [facility] [place].  Also patient thinks meds are causing her to have thrush in her mouth.  Has a coating on her tongue.  Will the diflucan help with that or does she also need a different medication for this?

## 2017-06-11 NOTE — Telephone Encounter (Signed)
Diflucan 150 three d apart, dukes m mwash 12 oz one tablespoon swish and spit tid

## 2017-06-30 ENCOUNTER — Ambulatory Visit: Payer: 59 | Admitting: Family Medicine

## 2017-06-30 ENCOUNTER — Telehealth: Payer: Self-pay | Admitting: Family Medicine

## 2017-06-30 ENCOUNTER — Encounter: Payer: Self-pay | Admitting: Family Medicine

## 2017-06-30 VITALS — BP 120/80 | Temp 98.1°F | Ht 66.5 in | Wt 212.1 lb

## 2017-06-30 DIAGNOSIS — J329 Chronic sinusitis, unspecified: Secondary | ICD-10-CM | POA: Diagnosis not present

## 2017-06-30 DIAGNOSIS — J45901 Unspecified asthma with (acute) exacerbation: Secondary | ICD-10-CM

## 2017-06-30 DIAGNOSIS — J441 Chronic obstructive pulmonary disease with (acute) exacerbation: Secondary | ICD-10-CM

## 2017-06-30 MED ORDER — PREDNISONE 20 MG PO TABS: ORAL_TABLET | ORAL | 0 refills | Status: DC

## 2017-06-30 MED ORDER — ALBUTEROL SULFATE HFA 108 (90 BASE) MCG/ACT IN AERS: 2.0000 | INHALATION_SPRAY | Freq: Four times a day (QID) | RESPIRATORY_TRACT | 3 refills | Status: DC | PRN

## 2017-06-30 MED ORDER — FLUCONAZOLE 150 MG PO TABS
150.0000 mg | ORAL_TABLET | Freq: Once | ORAL | 3 refills | Status: AC
Start: 2017-06-30 — End: 2017-06-30

## 2017-06-30 MED ORDER — LEVOFLOXACIN 500 MG PO TABS: 500.0000 mg | ORAL_TABLET | Freq: Every day | ORAL | 0 refills | Status: DC

## 2017-06-30 MED ORDER — ALBUTEROL SULFATE (2.5 MG/3ML) 0.083% IN NEBU: 2.5000 mg | INHALATION_SOLUTION | Freq: Four times a day (QID) | RESPIRATORY_TRACT | 1 refills | Status: DC | PRN

## 2017-06-30 MED ORDER — CYCLOBENZAPRINE HCL 10 MG PO TABS: ORAL_TABLET | ORAL | 1 refills | Status: DC

## 2017-06-30 NOTE — Progress Notes (Signed)
   Subjective:    Patient ID: Brandy Hensley, female    DOB: 06-11-1971, 46 y.o.   MRN: [id number]  URI   This is a new problem. The current episode started 1 to 4 weeks ago. Associated symptoms include coughing, headaches and wheezing.   Stated doxy and took the pred and finished it,  Took cefzil from left over, and started taking that  Some ongoing productive, dark yellowish,  Also cloud gunky and nasal disch   Patient states no other concerns this visit.  Review of Systems  Respiratory: Positive for cough and wheezing.   Neurological: Positive for headaches.       Objective:   Physical Exam  Alert active good hydration Positive nasal discharge pharynx normal lungs bilateral wheezes no tachypnea bronchial cough heart regular rate and rhythm     Assessment & Plan:  Impression persistent rhinosinusitis/bronchitis with persistent flare of reactive airways.  I first recommended Flovent but that caused side effects in the past.  Prednisone taper.  Levaquin.  Albuterol regular use.  If this turns into chronic reactive airways needs to return for further discussion

## 2017-06-30 NOTE — Telephone Encounter (Signed)
Pt aware.

## 2017-06-30 NOTE — Telephone Encounter (Signed)
Patient asked if we could call in a prescription for Diflucan for her due to getting re-occurring yeast infection with antibiotic treatment. Please advise?

## 2017-06-30 NOTE — Telephone Encounter (Signed)
I called in the medication to the [facility],Left message to r/c.

## 2017-06-30 NOTE — Telephone Encounter (Signed)
Ok, diflucan 150 two one p o three d apart, three ref

## 2017-08-27 ENCOUNTER — Ambulatory Visit: Payer: BLUE CROSS/BLUE SHIELD | Admitting: Nurse Practitioner

## 2017-10-06 ENCOUNTER — Other Ambulatory Visit: Payer: Self-pay

## 2017-10-06 MED ORDER — PANTOPRAZOLE SODIUM 40 MG PO TBEC: DELAYED_RELEASE_TABLET | ORAL | 2 refills | Status: DC

## 2017-10-06 NOTE — Progress Notes (Signed)
Prescriptions sent electronically to pharmacy 

## 2017-10-06 NOTE — Progress Notes (Signed)
Ok 11 ref 

## 2017-10-27 ENCOUNTER — Other Ambulatory Visit: Payer: Self-pay | Admitting: Family Medicine

## 2017-12-30 ENCOUNTER — Other Ambulatory Visit: Payer: Self-pay | Admitting: Family Medicine

## 2018-03-01 ENCOUNTER — Ambulatory Visit: Payer: 59 | Admitting: Family Medicine

## 2018-03-01 ENCOUNTER — Encounter: Payer: Self-pay | Admitting: Family Medicine

## 2018-03-01 VITALS — BP 118/80 | Temp 98.6°F | Ht 66.5 in | Wt 224.8 lb

## 2018-03-01 DIAGNOSIS — K529 Noninfective gastroenteritis and colitis, unspecified: Secondary | ICD-10-CM

## 2018-03-01 NOTE — Progress Notes (Signed)
   Subjective:    Patient ID: Brandy Hensley, female    DOB: 13-Mar-1971, 47 y.o.   MRN: [id number]  Abdominal Pain  This is a new problem. Episode onset: a little over one week. Associated symptoms include diarrhea and headaches. Associated symptoms comments: Abdominal cramping. She has tried nothing for the symptoms.   Question ibw component had had off and on for awhile  Also know diverticulitis in the past  Little over a week ten d ago  flt an urge oto go th the b rdiarrhea to the extreme  Bad snell early on   Darkish in color   freq bm's but not a lot    mucusy brown red color   irrit perrectal area  Now ongoing loos stools pasty frayed appearance at times, still seeping out a bit and not   Dim energy,  Mild heafdache   BMs usually stable and ore constip at times with bloating           Review of Systems  Gastrointestinal: Positive for abdominal pain and diarrhea.  Neurological: Positive for headaches.       Objective:   Physical Exam Lungs clear.  Heart rate and rhythm.  Abdomen hyperactive bowel sounds mild left lower quadrant tenderness to deep palpation no rebound no guarding  Alert active mild malaise     Assessment & Plan:  1 impression gastroenteritis.  Either post viral or post food poisoning discussed.  Doubt active infection.  Symptom care discussed.  Occasional Imodium.  Add probiotics this may also be flaring the patient's underlying IBS discussed

## 2018-04-13 ENCOUNTER — Other Ambulatory Visit: Payer: Self-pay | Admitting: Family Medicine

## 2018-06-26 ENCOUNTER — Other Ambulatory Visit: Payer: Self-pay | Admitting: Family Medicine

## 2018-07-19 ENCOUNTER — Other Ambulatory Visit: Payer: Self-pay | Admitting: Family Medicine

## 2018-08-02 ENCOUNTER — Ambulatory Visit: Payer: 59 | Admitting: Family Medicine

## 2018-08-02 VITALS — BP 114/70 | Temp 98.8°F | Ht 66.5 in | Wt 227.0 lb

## 2018-08-02 DIAGNOSIS — J111 Influenza due to unidentified influenza virus with other respiratory manifestations: Secondary | ICD-10-CM | POA: Diagnosis not present

## 2018-08-02 MED ORDER — PREDNISONE 20 MG PO TABS: ORAL_TABLET | ORAL | 0 refills | Status: DC

## 2018-08-02 MED ORDER — OSELTAMIVIR PHOSPHATE 75 MG PO CAPS: 75.0000 mg | ORAL_CAPSULE | Freq: Two times a day (BID) | ORAL | 0 refills | Status: DC

## 2018-08-02 NOTE — Progress Notes (Signed)
   Subjective:    Patient ID: Brandy Hensley, female    DOB: April 29, 1971, 47 y.o.   MRN: [id number]  Sinusitis  This is a new problem. Episode onset: 2 days. Associated symptoms include chills, congestion, coughing, headaches and a sore throat. Pertinent negatives include no shortness of breath. (Fever) Treatments tried: mucinex.   2 days ago started with h/a and not feeling well. Yesterday started with cough producing creamy/yellow mucous, and nasal congestion. No known fever, but reports chills/sweats yesterday evening and this morning. Has been taking mucinex cold/flu, has been helpful some. Denies wheezing/shortness of breath, has not needed albuterol inhaler. Appetite somewhat decreased. Reports some waves of nausea. No vomiting or diarrhea. No body aches.  Son was seen last week with similar symptoms, said he was diagnosed with parainfluenza and also given a Z-pack.   Current smoker.  Review of Systems  Constitutional: Positive for activity change, appetite change and chills.  HENT: Positive for congestion and sore throat.   Respiratory: Positive for cough. Negative for shortness of breath and wheezing.   Gastrointestinal: Positive for nausea. Negative for diarrhea and vomiting.  Musculoskeletal: Negative for myalgias.  Neurological: Positive for headaches.       Objective:   Physical Exam  Constitutional: She is oriented to person, place, and time.  Non-toxic appearance. She has a sickly appearance. No distress.  HENT:  Head: Normocephalic and atraumatic.  Right Ear: Tympanic membrane normal.  Left Ear: Tympanic membrane normal.  Nose: Nose normal. No sinus tenderness.  Mouth/Throat: Uvula is midline and oropharynx is clear and moist.  Eyes: Right eye exhibits no discharge. Left eye exhibits no discharge.  Neck: Neck supple.  Cardiovascular: Normal rate, regular rhythm and normal heart sounds.  Pulmonary/Chest: Effort normal and breath sounds normal. No respiratory distress.  Wheezes: Wheeze auscultated during cough.  Lymphadenopathy:    She has no cervical adenopathy.  Neurological: She is alert and oriented to person, place, and time.  Skin: Skin is warm and dry.  Nursing note and vitals reviewed.         Assessment & Plan:  Influenza  Likely true flu. Will treat with tamiflu x 5 days as well as prednisone taper. Recommend regular use of her inhaler over the next few days. Symptomatic treatment plus hydration discussed. F/u if symptoms worsen or fail to improve.   Dr. Lubertha SouthSteve Luking was consulted on this case and is in agreement with the above treatment plan.

## 2018-10-01 ENCOUNTER — Encounter: Payer: Self-pay | Admitting: Family Medicine

## 2018-10-01 ENCOUNTER — Ambulatory Visit: Payer: 59 | Admitting: Family Medicine

## 2018-10-01 VITALS — BP 122/78 | HR 80 | Temp 98.6°F

## 2018-10-01 DIAGNOSIS — J329 Chronic sinusitis, unspecified: Secondary | ICD-10-CM | POA: Diagnosis not present

## 2018-10-01 DIAGNOSIS — J310 Chronic rhinitis: Secondary | ICD-10-CM

## 2018-10-01 MED ORDER — CEFDINIR 300 MG PO CAPS: 300.0000 mg | ORAL_CAPSULE | Freq: Two times a day (BID) | ORAL | 0 refills | Status: DC

## 2018-10-01 MED ORDER — MECLIZINE HCL 25 MG PO TABS: 25.0000 mg | ORAL_TABLET | Freq: Three times a day (TID) | ORAL | 0 refills | Status: DC | PRN

## 2018-10-01 NOTE — Progress Notes (Signed)
Acute Office Visit  Subjective:    Patient ID: Brandy Hensley, female    DOB: 09/12/1970, 48 y.o.   MRN: [id number]  No chief complaint on file.   HPI Patient is in today for for feeling bad. Cough aand congestion   Head feels in the barrel  Sig pressur in the forntal   Roaring sens in both ears     Headache frontal  No achiness    Stools chroncially loose     Breathing over all the same some wheezy sensaio      Past Medical History:  Diagnosis Date  . Diverticulitis   . GERD (gastroesophageal reflux disease)   . PONV (postoperative nausea and vomiting)     Past Surgical History:  Procedure Laterality Date  . CHOLECYSTECTOMY  09/10/2012   Procedure: LAPAROSCOPIC CHOLECYSTECTOMY;  Surgeon: Dalia Heading, MD;  Location: [facility];  Service: General;  Laterality: N/A;  . COLONOSCOPY  2005-2006   Dr.Rehman @[facility]   . TUBAL LIGATION      Family History  Problem Relation Age of Onset  . Hypertension Mother   . Osteoporosis Mother   . Heart attack Maternal Grandmother   . Cancer Maternal Grandmother 23       pancreatic cancer  . Heart disease Maternal Grandmother 84       MI/CABG    Social History   Socioeconomic History  . Marital status: Legally Separated    Spouse name: Not on file  . Number of children: Not on file  . Years of education: Not on file  . Highest education level: Not on file  Occupational History  . Not on file  Social Needs  . Financial resource strain: Not on file  . Food insecurity:    Worry: Not on file    Inability: Not on file  . Transportation needs:    Medical: Not on file    Non-medical: Not on file  Tobacco Use  . Smoking status: Current Every Day Smoker    Packs/day: 1.00    Years: 24.00    Pack years: 24.00    Types: Cigarettes    Start date: 02/16/1987  . Smokeless tobacco: Never Used  Substance and Sexual Activity  . Alcohol use: No    Alcohol/week: 0.0 standard drinks  . Drug use: No  . Sexual activity:  Yes    Birth control/protection: Surgical  Lifestyle  . Physical activity:    Days per week: Not on file    Minutes per session: Not on file  . Stress: Not on file  Relationships  . Social connections:    Talks on phone: Not on file    Gets together: Not on file    Attends religious service: Not on file    Active member of club or organization: Not on file    Attends meetings of clubs or organizations: Not on file    Relationship status: Not on file  . Intimate partner violence:    Fear of current or ex partner: Not on file    Emotionally abused: Not on file    Physically abused: Not on file    Forced sexual activity: Not on file  Other Topics Concern  . Not on file  Social History Narrative  . Not on file    Outpatient Medications Prior to Visit  Medication Sig Dispense Refill  . albuterol (PROVENTIL HFA;VENTOLIN HFA) 108 (90 Base) MCG/ACT inhaler Inhale 2 puffs every 6 (six) hours as needed into the lungs  for wheezing or shortness of breath. 1 Inhaler 3  . albuterol (PROVENTIL) (2.5 MG/3ML) 0.083% nebulizer solution Take 3 mLs (2.5 mg total) every 6 (six) hours as needed by nebulization for wheezing or shortness of breath. 150 mL 1  . clonazePAM (KLONOPIN) 0.5 MG tablet TAKE 1/2 TO 1 TABLET BY MOUTH TWICE A DAY AS NEEDED FOR ANXIETY 30 tablet 2  . cyclobenzaprine (FLEXERIL) 10 MG tablet TAKE 1 TABLET BY MOUTH 3 TIMES A DAY AS NEEDED FOR MUSCLE SPASMS 30 tablet 1  . Multiple Vitamins-Minerals (EQ MULTIVITAMINS ADULT GUMMY PO) Take by mouth 2 (two) times daily.    Marland Kitchen. oseltamivir (TAMIFLU) 75 MG capsule Take 1 capsule (75 mg total) by mouth 2 (two) times daily. 10 capsule 0  . pantoprazole (PROTONIX) 40 MG tablet TAKE 1 TABLET BY MOUTH TWICE A DAY X 1 MONTH (INSURANCE WILL ONLY COVER 1 PER DAY) 90 tablet 2  . predniSONE (DELTASONE) 20 MG tablet Take 3 qd for 3 days, then 2 qd for 3 days, then one qd for 3 days 18 tablet 0  . Simethicone (GAS-X PO) Take by mouth as needed.     No  facility-administered medications prior to visit.     Allergies  Allergen Reactions  . Codeine Nausea And Vomiting    Skin on fire, Hot Flashes   . Flovent Diskus [Fluticasone Propionate (Inhal)] Other (See Comments)    Irritation ofthroat  . Augmentin [Amoxicillin-Pot Clavulanate] Diarrhea  . Contrave [Naltrexone-Bupropion Hcl Er] Palpitations  . Qsymia [Phentermine-Topiramate] Other (See Comments)    Tingling and muscle tightness    ROS No headache, no major weight loss or weight gain, no chest pain no back pain abdominal pain no change in bowel habits complete ROS otherwise negative     Objective:  Alert, mild malaise. Hydration good Vitals stable. frontal/ maxillary tenderness evident positive nasal congestion. pharynx normal neck supple  lungs clear/no crackles or wheezes. heart regular in rhythm     Assessment & Plan:  Impression rhinosinusitis likely post viral, discussed with patient. plan antibiotics prescribed. Questions answered. Symptomatic care discussed. warning signs discussed. WSL With elements of vertigo.  Antivert added also.  Patient brought up some chronic symptomatology and was encouraged to make a chronic office visit for this Lubertha SouthSteve Jashay Roddy, MD

## 2018-10-21 ENCOUNTER — Encounter: Payer: Self-pay | Admitting: Family Medicine

## 2018-10-21 ENCOUNTER — Ambulatory Visit: Payer: 59 | Admitting: Family Medicine

## 2018-10-21 VITALS — BP 120/82 | Ht 66.5 in | Wt 227.6 lb

## 2018-10-21 DIAGNOSIS — R000 Tachycardia, unspecified: Secondary | ICD-10-CM | POA: Diagnosis not present

## 2018-10-21 DIAGNOSIS — R55 Syncope and collapse: Secondary | ICD-10-CM

## 2018-10-21 DIAGNOSIS — R0600 Dyspnea, unspecified: Secondary | ICD-10-CM | POA: Diagnosis not present

## 2018-10-21 NOTE — Progress Notes (Signed)
   Subjective:    Patient ID: Brandy Hensley, female    DOB: [birth year], 48 y.o.   MRN: [id number] Patient arrives with numerous concerns HPI  Patient arrives to discuss chronic vertigo. Patient states when she holds her head certain ways she see black floaters and the room spins. Patient states this has been going on for a very long time.  Pt having ongoing challenges with dizziness   sevral weeks ago had resp infxn  Pt when turns head certain way   Gets lightheaded   Pt has been experiencing for awhile for years  Pt gets up form a siting position and feels a whoosh in the ears      Pt has chronic headaches  Pt has ongoing   Notes disfuse discomfort   Uses flex qhs   Smoking one pack per day ,  Few weeks ago hear rate was going fast, checked heart rate it was around 110, felt the need to breathe deep and slowly, felt scared  Pt googled flexeril and was concerned this may be causing some of her heart symptoms.   Review of Systems No headache, no major weight loss or weight gain, no chest pain no back pain abdominal pain no change in bowel habits complete ROS otherwise negative     Objective:   Physical Exam  Alert and oriented, vitals reviewed and stable, NAD ENT-TM's and ext canals WNL bilat via otoscopic exam Soft palate, tonsils and post pharynx WNL via oropharyngeal exam Neck-symmetric, no masses; thyroid nonpalpable and nontender Pulmonary-no tachypnea or accessory muscle use; Clear without wheezes via auscultation Card--no abnrml murmurs, rhythm reg and rate WNL Carotid pulses symmetric, without bruits Neuro exam no focal neurological deficits.  Neck supple no adenopathy  EKG.  Normal sinus rhythm.  No significant ST-T changes      Assessment & Plan:  Impression 1 intermittent rapid heart rate with nonspecific chest symptoms.  Accompanied by dizziness and near vertigo when turning head in one direction or the other.  Patient has multiple risk factors.   We will do carotid ultrasounds.  Also will refer to cardiology with her wrap heart rate and nonspecific symptoms in multiple risk factors.  2.  Patient notes progressive shortness of breath.  Unfortunately chronic smoker.  Time to take a closer look at this.  Discussed.  We will do pulmonary function test.  3.  Headaches ongoing primarily nuchal.  Seem to be stress associated  Pulmonary function test.  Also chest x-ray.  Follow-up as scheduled.  Cardiology referral rationale discussed.  I do not think Flexeril is causing the symptoms discussed  Greater than 50% of this 40 minute face to face visit was spent in counseling and discussion and coordination of care regarding the above diagnosis/diagnosies

## 2018-10-25 ENCOUNTER — Encounter: Payer: Self-pay | Admitting: Family Medicine

## 2018-11-17 ENCOUNTER — Encounter (HOSPITAL_COMMUNITY): Payer: BLUE CROSS/BLUE SHIELD

## 2018-11-18 ENCOUNTER — Ambulatory Visit (INDEPENDENT_AMBULATORY_CARE_PROVIDER_SITE_OTHER): Payer: 59

## 2018-11-18 ENCOUNTER — Other Ambulatory Visit: Payer: Self-pay

## 2018-11-18 ENCOUNTER — Ambulatory Visit (INDEPENDENT_AMBULATORY_CARE_PROVIDER_SITE_OTHER): Payer: 59 | Admitting: Cardiology

## 2018-11-18 ENCOUNTER — Encounter: Payer: Self-pay | Admitting: Cardiology

## 2018-11-18 VITALS — BP 114/70 | HR 89 | Ht 67.0 in | Wt 227.0 lb

## 2018-11-18 DIAGNOSIS — R42 Dizziness and giddiness: Secondary | ICD-10-CM

## 2018-11-18 DIAGNOSIS — J449 Chronic obstructive pulmonary disease, unspecified: Secondary | ICD-10-CM

## 2018-11-18 DIAGNOSIS — R002 Palpitations: Secondary | ICD-10-CM | POA: Diagnosis not present

## 2018-11-18 NOTE — Progress Notes (Signed)
Cardiology Office Note  Date: 11/18/2018   ID: Brandy Hensley, DOB [birth year], MRN [id number]  PCP: Merlyn Albert, MD  Consulting Cardiologist: Nona Dell, MD   Chief Complaint  Patient presents with  . Palpitations    History of Present Illness: Brandy Hensley is a 48 y.o. female referred for cardiology consultation for evaluation of palpitations.  We discussed her symptoms today.  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  She sometimes feels lightheaded when she stands up quickly, lasts a few seconds.  She has never had frank syncope.  Unrelated to this dizziness, she also feels intermittent fluttering in her chest, very sporadic in general.  She did have one episode of fast heartbeat and diaphoresis that woke her from sleep last month, lasted for several minutes.  She thought that it might have been related to Flexeril which she had been taking for years intermittently to treat insomnia.  She has stopped this medication and has not had any nocturnal episodes so far.  Otherwise her medications are unchanged and listed below.  She is not using meclizine.  She has been scheduled for PFTs and carotid Dopplers by Dr. Gerda Diss, still pending.  I reviewed her recent ECG from late February which was normal.  Orthostatic measurements were made today and nondiagnostic.   Past Medical History:  Diagnosis Date  . Diverticulitis   . GERD (gastroesophageal reflux disease)   . PONV (postoperative nausea and vomiting)     Past Surgical History:  Procedure Laterality Date  . CHOLECYSTECTOMY  09/10/2012   Procedure: LAPAROSCOPIC CHOLECYSTECTOMY;  Surgeon: Dalia Heading, MD;  Location: [facility];  Service: General;  Laterality: N/A;  . COLONOSCOPY  2005-2006   Dr.Rehman @[facility]   . TUBAL LIGATION      Current  Outpatient Medications  Medication Sig Dispense Refill  . albuterol (PROVENTIL HFA;VENTOLIN HFA) 108 (90 Base) MCG/ACT inhaler Inhale 2 puffs every 6 (six) hours as needed into the lungs for wheezing or shortness of breath. 1 Inhaler 3  . albuterol (PROVENTIL) (2.5 MG/3ML) 0.083% nebulizer solution Take 3 mLs (2.5 mg total) every 6 (six) hours as needed by nebulization for wheezing or shortness of breath. 150 mL 1  . clonazePAM (KLONOPIN) 0.5 MG tablet TAKE 1/2 TO 1 TABLET BY MOUTH TWICE A DAY AS NEEDED FOR ANXIETY 30 tablet 2  . cyclobenzaprine (FLEXERIL) 10 MG tablet TAKE 1 TABLET BY MOUTH 3 TIMES A DAY AS NEEDED FOR MUSCLE SPASMS 30 tablet 1  . meclizine (ANTIVERT) 25 MG tablet Take 1 tablet (25 mg total) by mouth 3 (three) times daily as needed for dizziness. 25 tablet 0  . Multiple Vitamins-Minerals (EQ MULTIVITAMINS ADULT GUMMY PO) Take by mouth 2 (two) times daily.    . pantoprazole (PROTONIX) 40 MG tablet TAKE 1 TABLET BY MOUTH TWICE A DAY X 1 MONTH (INSURANCE WILL ONLY COVER 1 PER DAY) 90 tablet 2  . Simethicone (GAS-X PO) Take by mouth as needed.     No current facility-administered medications for this visit.    Allergies:  Codeine; Flovent diskus [fluticasone propionate (inhal)]; Augmentin [amoxicillin-pot clavulanate]; Contrave [naltrexone-bupropion hcl er]; and Qsymia [phentermine-topiramate]   Social History: The patient  reports that she has been smoking cigarettes. She started smoking about 31 years ago. She has a 24.00 pack-year smoking  history. She has never used smokeless tobacco. She reports that she does not drink alcohol or use drugs.   Family History: The patient's family history includes Cancer (age of onset: 53) in her maternal grandmother; Heart attack in her maternal grandmother; Heart disease (age of onset: 65) in her maternal grandmother; Hypertension in her mother; Osteoporosis in her mother.   ROS:  Please see the history of present illness. Otherwise, complete  review of systems is positive for none.  All other systems are reviewed and negative.   Physical Exam: VS:  BP 114/70 (BP Location: Left Arm)   Pulse 89   Ht 5\' 7"  (1.702 m)   Wt 227 lb (103 kg)   SpO2 98%   BMI 35.55 kg/m , BMI Body mass index is 35.55 kg/m.  Wt Readings from Last 3 Encounters:  11/18/18 227 lb (103 kg)  10/21/18 227 lb 9.6 oz (103.2 kg)  08/02/18 227 lb (103 kg)    General: Overweight woman, appears comfortable at rest. HEENT: Conjunctiva and lids normal, oropharynx clear. Neck: Supple, no elevated JVP or carotid bruits, no thyromegaly. Lungs: Clear to auscultation, nonlabored breathing at rest. Cardiac: Regular rate and rhythm, no S3 or significant systolic murmur, no pericardial rub. Abdomen: Soft, nontender, bowel sounds present, no guarding or rebound. Extremities: No pitting edema, distal pulses 2+. Skin: Warm and dry. Musculoskeletal: No kyphosis. Neuropsychiatric: Alert and oriented x3, affect grossly appropriate.  ECG: I personally reviewed the tracing from 10/21/2018 which showed normal sinus rhythm.  Recent Labwork: No results found for requested labs within last 8760 hours.     Component Value Date/Time   CHOL 215 (H) 02/27/2017 0809   TRIG 191 (H) 02/27/2017 0809   HDL 48 02/27/2017 0809   CHOLHDL 4.5 (H) 02/27/2017 0809   CHOLHDL 4.2 10/17/2013 0750   VLDL 15 10/17/2013 0750   LDLCALC 129 (H) 02/27/2017 0809    Other Studies Reviewed Today:  Chest CT 05/01/2017: IMPRESSION: 1. The recently suspected left lung nodule is a chronic area of lingular atelectasis/scarring, less prominent than seen on 03/09/2004. 2. No true lung nodules are present. 3. Changes of COPD with predominant paraseptal emphysema. 4. Minimal calcified aortic atherosclerosis.  Assessment and Plan:  1.  Intermittent palpitations as discussed above.  Baseline ECG is normal.  She reports that her mother has a history of atrial fibrillation.  Otherwise she is not on  any specific offending medications and has no other obvious precipitant.  We will obtain a two-week cardiac monitor to further investigate any potential arrhythmia.  2.  Episodic lightheadedness, etiology not entirely clear, although I would doubt carotid artery disease as a specific cause.  Symptoms are somewhat equivocal for vertigo based on description although if her work-up is unrevealing, might be worth considering referral for PT evaluation.  She was not found to be orthostatic today.  3.  Findings suggestive of COPD based on previous chest CT imaging from 2018, history of tobacco abuse.  PFTs have been ordered by Dr. Gerda Diss and are pending.  Current medicines were reviewed with the patient today.   Orders Placed This Encounter  Procedures  . Cardiac event monitor    Disposition: Call with test results.  Signed, Jonelle Sidle, MD, [facility] 11/18/2018 2:17 PM    [facility] at [facility] [address] [place], [place] [address] Phone: [phone]; Fax: [phone]

## 2018-11-18 NOTE — Patient Instructions (Signed)
Medication Instructions:  Your physician recommends that you continue on your current medications as directed. Please refer to the Current Medication list given to you today.  If you need a refill on your cardiac medications before your next appointment, please call your pharmacy.   Lab work: None today If you have labs (blood work) drawn today and your tests are completely normal, you will receive your results only by: Marland Kitchen MyChart Message (if you have MyChart) OR . A paper copy in the mail If you have any lab test that is abnormal or we need to change your treatment, we will call you to review the results.  Testing/Procedures: Your physician has recommended that you wear an event monitor for 14 days. Event monitors are medical devices that record the heart's electrical activity. Doctors most often [place] these monitors to diagnose arrhythmias. Arrhythmias are problems with the speed or rhythm of the heartbeat. The monitor is a small, portable device. You can wear one while you do your normal daily activities. This is usually used to diagnose what is causing palpitations/syncope (passing out).     Follow-Up: We will call you with results     Thank you for choosing [facility] !

## 2018-12-08 ENCOUNTER — Encounter (HOSPITAL_COMMUNITY): Payer: BLUE CROSS/BLUE SHIELD

## 2018-12-10 ENCOUNTER — Encounter: Payer: Self-pay | Admitting: Family Medicine

## 2018-12-10 ENCOUNTER — Other Ambulatory Visit: Payer: Self-pay

## 2018-12-10 ENCOUNTER — Ambulatory Visit (HOSPITAL_COMMUNITY)
Admission: RE | Admit: 2018-12-10 | Discharge: 2018-12-10 | Disposition: A | Discharge status: Home / Self Care | Payer: 59 | Source: Ambulatory Visit | Attending: Family Medicine | Admitting: Family Medicine

## 2018-12-10 ENCOUNTER — Ambulatory Visit (INDEPENDENT_AMBULATORY_CARE_PROVIDER_SITE_OTHER): Payer: 59 | Admitting: Family Medicine

## 2018-12-10 DIAGNOSIS — R059 Cough, unspecified: Secondary | ICD-10-CM

## 2018-12-10 DIAGNOSIS — R0602 Shortness of breath: Secondary | ICD-10-CM | POA: Diagnosis present

## 2018-12-10 DIAGNOSIS — J441 Chronic obstructive pulmonary disease with (acute) exacerbation: Secondary | ICD-10-CM | POA: Insufficient documentation

## 2018-12-10 DIAGNOSIS — R05 Cough: Secondary | ICD-10-CM | POA: Insufficient documentation

## 2018-12-10 MED ORDER — FLUCONAZOLE 150 MG PO TABS: 150.0000 mg | ORAL_TABLET | Freq: Once | ORAL | 4 refills | Status: AC

## 2018-12-10 MED ORDER — AZITHROMYCIN 250 MG PO TABS: ORAL_TABLET | ORAL | 0 refills | Status: DC

## 2018-12-10 MED ORDER — NYSTATIN 100000 UNIT/ML MT SUSP: 5.0000 mL | Freq: Four times a day (QID) | OROMUCOSAL | 0 refills | Status: DC

## 2018-12-10 MED ORDER — PREDNISONE 20 MG PO TABS: ORAL_TABLET | ORAL | 0 refills | Status: DC

## 2018-12-10 MED ORDER — FLUTICASONE-UMECLIDIN-VILANT 100-62.5-25 MCG/INH IN AEPB: 1.0000 | INHALATION_SPRAY | Freq: Every day | RESPIRATORY_TRACT | 6 refills | Status: DC

## 2018-12-10 NOTE — Progress Notes (Signed)
   Subjective:    Patient ID: Brandy Hensley, female    DOB: [birth year], 48 y.o.   MRN: [id number] Initially patient was seen virtually but then she was seen in person because of her symptomatology Cough  This is a new problem. Episode onset: 5 days. Associated symptoms comments: Congestion, chest feels tight, sob. Treatments tried: albuterol inhaler.  This patient relates cough over the past 5 to 6 days some chest congestion feels little short of breath denies high fever chills denies wheezing difficulty breathing she states she has some sweats but she attributes that to hot flashes she was recently diagnosed with COPD and she is awaiting pulmonary function testing.  She is a smoker but she is cut back on smoking she states the albuterol is helping but not as much as it normally does she denies nausea vomiting diarrhea she does feel like there is something could be going on once again though she denies body aches muscle aches. Was seen in February and diagnosed with copd.   Virtual Visit via Video Note  I connected with Aldine L Evitt on 12/10/18 at  1:10 PM EDT by a video enabled telemedicine application and verified that I am speaking with the correct person using two identifiers.   I discussed the limitations of evaluation and management by telemedicine and the availability of in person appointments. The patient expressed understanding and agreed to proceed.  History of Present Illness:    Observations/Objective:   Assessment and Plan:   Follow Up Instructions:    I discussed the assessment and treatment plan with the patient. The patient was provided an opportunity to ask questions and all were answered. The patient agreed with the plan and demonstrated an understanding of the instructions.   The patient was advised to call back or seek an in-person evaluation if the symptoms worsen or if the condition fails to improve as anticipated.  I provided 20 minutes of non-face-to-face time  during this encounter.   To some degree the patient has had symptoms ever since February but worse over the past several days  She was diagnosed with COPD but has not had a pulmonary function test yet because of scheduling issues due to coronavirus  Review of Systems  Respiratory: Positive for cough.   Patient with cough some congestion some wheezing some shortness of breath denies high fever denies sweats denies myalgias     Objective:   Physical Exam Neck no masses no tracheal deviation Lungs are clear no crackles no wheezing heart regular no murmurs temperature oral 98.5 O2 saturation 96%  Her chest x-ray did not show pneumonia but I did talk with her in detail that if she gets worse she is to go to ER otherwise give [place] an update next week and use her medications I do not feel she has coronavirus based upon what I am seeing so far     Assessment & Plan:  I believe this is a flareup of COPD exacerbation I do not believe that this is coronavirus Warning signs are coronavirus discussed with patient If progressive symptoms or worse go to ER Chest x-ray ordered Add Trelegy on a daily basis Albuterol PRN Short course of prednisone Round of antibiotics for COPD exacerbation Encourage patient quit smoking Follow-up with Dr. Brett Canales in several weeks sooner problems

## 2018-12-13 ENCOUNTER — Encounter: Payer: Self-pay | Admitting: Family Medicine

## 2018-12-14 ENCOUNTER — Other Ambulatory Visit: Payer: Self-pay | Admitting: Family Medicine

## 2018-12-14 MED ORDER — LEVOFLOXACIN 500 MG PO TABS: ORAL_TABLET | ORAL | 0 refills | Status: DC

## 2018-12-14 NOTE — Telephone Encounter (Signed)
I would recommend finishing the prednisone- should be on the last dose  Continue the albuterol  We need to do an additional round of antibiotics I would recommend Levaquin 500 mg 1 daily for the next 7 days-take with a snack-I also recommend a follow-up visit with Dr. Brett Canales virtual next week as a recheck of her respiratory symptoms

## 2018-12-14 NOTE — Addendum Note (Signed)
Addended by: Marlowe Shores on: 12/14/2018 11:20 AM   Modules accepted: Orders

## 2018-12-16 ENCOUNTER — Telehealth: Payer: Self-pay | Admitting: Family Medicine

## 2018-12-16 MED ORDER — PREDNISONE 20 MG PO TABS: ORAL_TABLET | ORAL | 0 refills | Status: DC

## 2018-12-16 MED ORDER — CLARITHROMYCIN 500 MG PO TABS: ORAL_TABLET | ORAL | 0 refills | Status: DC

## 2018-12-16 NOTE — Telephone Encounter (Signed)
Please review my chart message patient sent on 4/21 stating not feeling any better. She still has headache,backpain and now diarrhea.She states cant take levaquin 500 making her sick. Please advise

## 2018-12-16 NOTE — Telephone Encounter (Signed)
Stop levaqiun. Start biaxin 500 bid tend d, refill five more d of the pred same dose, scjhed f u appt with me on monday

## 2018-12-16 NOTE — Telephone Encounter (Signed)
Please advise. Thank you

## 2018-12-16 NOTE — Telephone Encounter (Signed)
Medication sent in. Left message to return call 

## 2018-12-17 NOTE — Telephone Encounter (Signed)
Patient notified and patient scheduled virtual visit for Monday with Dr Brett Canales

## 2018-12-20 ENCOUNTER — Ambulatory Visit (INDEPENDENT_AMBULATORY_CARE_PROVIDER_SITE_OTHER): Payer: 59 | Admitting: Family Medicine

## 2018-12-20 ENCOUNTER — Other Ambulatory Visit: Payer: Self-pay

## 2018-12-20 ENCOUNTER — Encounter: Payer: Self-pay | Admitting: Family Medicine

## 2018-12-20 DIAGNOSIS — M549 Dorsalgia, unspecified: Secondary | ICD-10-CM

## 2018-12-20 DIAGNOSIS — J441 Chronic obstructive pulmonary disease with (acute) exacerbation: Secondary | ICD-10-CM | POA: Diagnosis not present

## 2018-12-20 DIAGNOSIS — R0600 Dyspnea, unspecified: Secondary | ICD-10-CM

## 2018-12-20 MED ORDER — ALBUTEROL SULFATE HFA 108 (90 BASE) MCG/ACT IN AERS: 2.0000 | INHALATION_SPRAY | Freq: Four times a day (QID) | RESPIRATORY_TRACT | 5 refills | Status: DC | PRN

## 2018-12-20 NOTE — Progress Notes (Signed)
   Subjective:  Audio plus visual  Patient ID: PENNEY PFENNINGER, female    DOB: 15-Aug-1971, 48 y.o.   MRN: [id number] Format -   Patient present at home Provider present at office Consent for interaction obtained Coronavirus outbreak made virtual visit necessary  HPIFollow up copd. Having some breathing issues since February. Not able to do pft due to hospital having to reschedule. Pt states she improved with prednisone and zpack. Then tried levaquin and she had to stop due to side effects. Currently taking biaxin and prednisone. Breathing not good yesterday.   Virtual Visit via Telephone Note  I connected with Rosely L Mazur on 12/20/18 at 10:00 AM EDT by telephone and verified that I am speaking with the correct person using two identifiers.   I discussed the limitations, risks, security and privacy concerns of performing an evaluation and management service by telephone and the availability of in person appointments. I also discussed with the patient that there may be a patient responsible charge related to this service. The patient expressed understanding and agreed to proceed.   History of Present Illness:    Observations/Objective:   Assessment and Plan:   Follow Up Instructions:    I discussed the assessment and treatment plan with the patient. The patient was provided an opportunity to ask questions and all were answered. The patient agreed with the plan and demonstrated an understanding of the instructions.   The patient was advised to call back or seek an in-person evaluation if the symptoms worsen or if the condition fails to improve as anticipated.  I provided69minutes of non-face-to-face time during this encounter.    Complete hospital/office/consults/blood work/x-rays/reviewed in presence of patient today  Patient has had a rather protracted challenging difficulty with progressive shortness of breath in recent months.  Also accompanied by fatigue.  Companied by upper  back pain.  Patient feels may be positional and aggravated by coughing.  Please see prior notes.  Please see prior phone message  Now on new antibiotic and steroids.  Handling that trilogy relatively well, though extremely costly.  Has seemed of helped her breathing  Patient tearful about the progressive respiratory loss she has suffered.  Cardiology referral was negative work-up  we ordered pulmonary function test but unable to do secondary to coronavirus delays  Review of Systems No fever no chills no abdominal pain no rash    Objective:   Physical Exam  Virtual visit      Assessment & Plan:  Impression progressive dyspnea/multiple rounds of antibiotics and steroids/patient compliant with albuterol plus Trelegy.  Still periods of shortness of breath.  Patient states she has recently stopped smoking and this time she intends to stick with it.  Options discussed at length.  Likely element of COPD perhaps more advanced than we had thought.  Time for pulmonary referral rationale described.  Back pain likely musculoskeletal secondary to persistent coughing.  Patient is distressed not enough for ER referral rationale discussed at length.  Continue Biaxin continue steroids continue Trelegy continue albuterol further recommendations via pulmonary doctor  Greater than 50% of this 25 minute face to face visit was spent in counseling and discussion and coordination of care regarding the above diagnosis/diagnosies

## 2018-12-22 ENCOUNTER — Encounter: Payer: Self-pay | Admitting: Internal Medicine

## 2018-12-22 ENCOUNTER — Telehealth: Payer: Self-pay | Admitting: Internal Medicine

## 2018-12-22 ENCOUNTER — Ambulatory Visit: Payer: 59 | Admitting: Internal Medicine

## 2018-12-22 ENCOUNTER — Other Ambulatory Visit: Payer: Self-pay

## 2018-12-22 VITALS — BP 122/80 | HR 74 | Temp 98.1°F | Ht 67.0 in | Wt 224.0 lb

## 2018-12-22 DIAGNOSIS — R0609 Other forms of dyspnea: Secondary | ICD-10-CM | POA: Insufficient documentation

## 2018-12-22 DIAGNOSIS — F17210 Nicotine dependence, cigarettes, uncomplicated: Secondary | ICD-10-CM

## 2018-12-22 DIAGNOSIS — K580 Irritable bowel syndrome with diarrhea: Secondary | ICD-10-CM | POA: Diagnosis not present

## 2018-12-22 DIAGNOSIS — J449 Chronic obstructive pulmonary disease, unspecified: Secondary | ICD-10-CM | POA: Insufficient documentation

## 2018-12-22 DIAGNOSIS — E876 Hypokalemia: Secondary | ICD-10-CM

## 2018-12-22 LAB — CBC WITH DIFFERENTIAL/PLATELET
Basophils Absolute: 0 10*3/uL (ref 0.0–0.1)
Basophils Relative: 0.2 % (ref 0.0–3.0)
Eosinophils Absolute: 0.1 10*3/uL (ref 0.0–0.7)
Eosinophils Relative: 0.4 % (ref 0.0–5.0)
HCT: 37.2 % (ref 36.0–46.0)
Hemoglobin: 12.2 g/dL (ref 12.0–15.0)
Lymphocytes Relative: 35.1 % (ref 12.0–46.0)
Lymphs Abs: 5.6 10*3/uL — ABNORMAL HIGH (ref 0.7–4.0)
MCHC: 32.8 g/dL (ref 30.0–36.0)
MCV: 85.5 fl (ref 78.0–100.0)
Monocytes Absolute: 1.5 10*3/uL — ABNORMAL HIGH (ref 0.1–1.0)
Monocytes Relative: 9.3 % (ref 3.0–12.0)
Neutro Abs: 8.8 10*3/uL — ABNORMAL HIGH (ref 1.4–7.7)
Neutrophils Relative %: 55 % (ref 43.0–77.0)
Platelets: 227 10*3/uL (ref 150.0–400.0)
RBC: 4.34 Mil/uL (ref 3.87–5.11)
RDW: 15.1 % (ref 11.5–15.5)
WBC: 16 10*3/uL — ABNORMAL HIGH (ref 4.0–10.5)

## 2018-12-22 LAB — BASIC METABOLIC PANEL
BUN: 12 mg/dL (ref 6–23)
CO2: 28 mEq/L (ref 19–32)
Calcium: 8.9 mg/dL (ref 8.4–10.5)
Chloride: 102 mEq/L (ref 96–112)
Creatinine, Ser: 0.9 mg/dL (ref 0.40–1.20)
GFR: 67.16 mL/min (ref >60.00)
Glucose, Bld: 64 mg/dL — ABNORMAL LOW (ref 70–99)
Potassium: 2.8 mEq/L — CL (ref 3.5–5.1)
Sodium: 140 mEq/L (ref 135–145)

## 2018-12-22 LAB — BRAIN NATRIURETIC PEPTIDE: Pro B Natriuretic peptide (BNP): 29 pg/mL (ref 0.0–100.0)

## 2018-12-22 LAB — TSH: TSH: 1.14 u[IU]/mL (ref 0.35–4.50)

## 2018-12-22 NOTE — Telephone Encounter (Signed)
See result note.  

## 2018-12-22 NOTE — Assessment & Plan Note (Signed)
4-5 min discussion re active cigarette smoking in addition to office E&M  Ask about tobacco use:  ongoing Advise quitting:    I reviewed the Fletcher curve with the patient that basically indicates  if you quit smoking when your best day FEV1 is still well preserved (as is probably still  the case here)  it is highly unlikely you will progress to severe disease and informed the patient there was  no medication on the market that has proven to alter the curve/ its downward trajectory  or the likelihood of progression of their disease(unlike other chronic medical conditions such as atheroclerosis where we do think we can change the natural hx with risk reducing meds)    Therefore stopping smoking and maintaining abstinence are  the most important aspects of care, not choice of inhalers or for that matter, doctors.   Treatment other than smoking cessation  is entirely directed by severity of symptoms and focused also on reducing exacerbations, not attempting to change the natural history of the disease.   Assess willingness:  Not committed at this point Assist in quit attempt:  Per PCP when ready Arrange follow up:   Follow up per Primary Care planned  For smoking cessation classes call [phone]

## 2018-12-22 NOTE — Patient Instructions (Addendum)
Treatment consists of avoiding foods that cause gas (especially boiled eggs, mexcican food but especially  beans and undercooked vegetables like  spinach and some salads)  and citrucel 1 heaping tsp twice daily with a large glass of water.  Pain should improve w/in 2 weeks and if not then consider further GI work up.    Plan A = Automatic = Trelegy one click each am take two good drags  Plan B = Backup Only use your albuterol inhaler as a rescue medication to be used if you can't catch your breath by resting or doing a relaxed purse lip breathing pattern.  - The less you use it, the better it will work when you need it. - Ok to use the inhaler up to 2 puffs  every 4 hours if you must but call for appointment if use goes up over your usual need - Don't leave home without it !!  (think of it like the spare tire for your car)   Plan C = Crisis - only use your albuterol nebulizer if you first try Plan B and it fails to help > ok to use the nebulizer up to every 4 hours but if start needing it regularly call for immediate appointment  Stop protonix and replace with pepcid 20 mg after bfast and after supper (otc)   GERD (REFLUX)  is an extremely common cause of respiratory symptoms just like yours , many times with no obvious heartburn at all.    It can be treated with medication, but also with lifestyle changes including elevation of the head of your bed (ideally with 6 -8inch blocks under the headboard of your bed),  Smoking cessation, avoidance of late meals, excessive alcohol, and avoid fatty foods, chocolate, peppermint, colas, red wine, and acidic juices such as orange juice.  NO MINT OR MENTHOL PRODUCTS SO NO COUGH DROPS  USE SUGARLESS CANDY INSTEAD (Jolley ranchers or Stover's or Life Savers) or even ice chips will also do - the key is to swallow to prevent all throat clearing. NO OIL BASED VITAMINS - use powdered substitutes.  Avoid fish oil when coughing.    The key is to stop smoking  completely before smoking completely stops you! - For smoking cessation information call [phone]    Please remember to go to the lab department   for your tests - we will call you with the results when they are available.      Please schedule a follow up office visit in 4 weeks, sooner if needed  with all medications /inhalers/ solutions in hand so we can verify exactly what you are taking. This includes all medications from all doctors and over the counters  - PFTs on return

## 2018-12-22 NOTE — Progress Notes (Signed)
Brandy PootDeana L Billiot, female    DOB: 1971-08-08      MRN: [id number]   Brief patient profile:  [id number] [id number] active smoker no troubles with doe onset around 2015 first with steps then gradually worse to point of walking at grocery store shopping but started albuterol hfa  x around 2018 ? Helped / nebulizer may have helped more and so trelegy was started 12/10/18 and improved doe but not back to baseline so referred to pulmonary clinic 12/22/2018 by Dr   Gerda DissLuking      History of Present Illness  12/22/2018  Pulmonary/ 1st office eval/Paralee Pendergrass  Chief Complaint  Patient presents with   Pulmonary Consult    Referred by Dr. Gerda DissLuking. Pt c/o DOE and chest tightness for the past year, progressively worse for the past 6 months. She gets winded walking to her mailbox.   Dyspnea:  MMRC2 = can't walk a nl pace on a flat grade s sob but does fine slow and flat  Cough: better now / does not tend to be worse in am/ min mucoid sputum  Sleep: on side/ bed is flat SABA use: neb maybe twice since started and much better took prednisone and finished one day prior to OV   mb is about 100 ft slt incline back to house where  has to stop at landing on return  and a little better since trelegy   No obvious day to day or daytime variability or assoc excess/ purulent sputum or mucus plugs or hemoptysis or cp or chest tightness, subjective wheeze or overt sinus or hb symptoms.   Sleeping as above ok  without nocturnal  or early am exacerbation  of respiratory  c/o's or need for noct saba. Also denies any obvious fluctuation of symptoms with weather or environmental changes or other aggravating or alleviating factors except as outlined above   No unusual exposure hx or h/o childhood pna/ asthma or knowledge of premature birth.  Current Allergies, Complete Past Medical History, Past Surgical History, Family History, and Social History were reviewed in [facility] electronic medical record.  ROS  The following are not active  complaints unless bolded Hoarseness, sore throat, dysphagia, dental problems, itching, sneezing,  nasal congestion or discharge of excess mucus or purulent secretions, ear ache,   fever, chills, sweats, unintended wt loss or wt gain, classically pleuritic or exertional cp,  orthopnea pnd or arm/hand swelling  or leg swelling, presyncope, palpitations, abdominal pain/bloating, anorexia, nausea, vomiting, diarrhea  or change in bowel habits or change in bladder habits, change in stools or change in urine, dysuria, hematuria,  rash, arthralgias, visual complaints, headache, numbness, weakness or ataxia or problems with walking or coordination,  change in mood or  memory.             Past Medical History:  Diagnosis Date   Diverticulitis    GERD (gastroesophageal reflux disease)    PONV (postoperative nausea and vomiting)     Outpatient Medications Prior to Visit  Medication Sig Dispense Refill   albuterol (PROVENTIL) (2.5 MG/3ML) 0.083% nebulizer solution Take 3 mLs (2.5 mg total) every 6 (six) hours as needed by nebulization for wheezing or shortness of breath. 150 mL 1   albuterol (VENTOLIN HFA) 108 (90 Base) MCG/ACT inhaler Inhale 2 puffs into the lungs every 6 (six) hours as needed for wheezing or shortness of breath. 1 Inhaler 5   clarithromycin (BIAXIN) 500 MG tablet Take one tablet by mouth twice daily for 10 days 20 tablet  0   cyclobenzaprine (FLEXERIL) 10 MG tablet TAKE 1 TABLET BY MOUTH 3 TIMES A DAY AS NEEDED FOR MUSCLE SPASMS 30 tablet 1   Fluticasone-Umeclidin-Vilant (TRELEGY ELLIPTA) 100-62.5-25 MCG/INH AEPB Inhale 1 puff into the lungs daily. 1 each 6   meclizine (ANTIVERT) 25 MG tablet Take 1 tablet (25 mg total) by mouth 3 (three) times daily as needed for dizziness. 25 tablet 0   Multiple Vitamins-Minerals (EQ MULTIVITAMINS ADULT GUMMY PO) Take by mouth 2 (two) times daily.     pantoprazole (PROTONIX) 40 MG tablet TAKE 1 TABLET BY MOUTH TWICE A DAY X 1 MONTH  (INSURANCE WILL ONLY COVER 1 PER DAY) (Patient taking differently: Take 40 mg by mouth daily. ) 90 tablet 2   Simethicone (GAS-X PO) Take by mouth as needed.     nystatin (MYCOSTATIN) 100000 UNIT/ML suspension Take 5 mLs (500,000 Units total) by mouth 4 (four) times daily. (Patient not taking: Reported on 12/22/2018) 120 mL 0   clonazePAM (KLONOPIN) 0.5 MG tablet TAKE 1/2 TO 1 TABLET BY MOUTH TWICE A DAY AS NEEDED FOR ANXIETY 30 tablet 2             Objective:     BP 122/80 (BP Location: Left Arm, Cuff Size: Normal)    Pulse 74    Temp 98.1 F (36.7 C) (Oral)    Ht  (1.702 m)    Wt 224 lb (101.6 kg)    SpO2 97%    BMI 35.08 kg/m   SpO2: 97 %  RA  amb wf nad   HEENT: nl dentition / oropharynx. Nl external ear canals without cough reflex -  Mild bilateral non-specific turbinate edema     NECK :  without JVD/Nodes/TM/ nl carotid upstrokes bilaterally   LUNGS: no acc muscle use,  Mild barrel  contour chest wall with bilateral  Distant bs s audible wheeze and  without cough on insp or exp maneuver and mild  Hyperresonant  to  percussion bilaterally     CV:  RRR  no s3 or murmur or increase in P2, and no edema   ABD:  soft and nontender with pos late  insp Hoover's  in the supine position. No bruits or organomegaly appreciated, bowel sounds nl  MS:   Nl gait/  ext warm without deformities, calf tenderness, cyanosis or clubbing No obvious joint restrictions   SKIN: warm and dry without lesions    NEURO:  alert, approp, nl sensorium with  no motor or cerebellar deficits apparent.     I personally reviewed images and agree with radiology impression as follows:  CXR:   12/10/2018 No edema or consolidation.   Labs ordered/ reviewed:      Chemistry      Component Value Date/Time   NA 140 12/22/2018 1032   NA 141 02/27/2017 0809   K 2.8 (LL) 12/22/2018 1032   CL 102 12/22/2018 1032   CO2 28 12/22/2018 1032   BUN 12 12/22/2018 1032   BUN 13 02/27/2017 0809    CREATININE 0.90 12/22/2018 1032   CREATININE 0.80 10/17/2013 0750      Component Value Date/Time   CALCIUM 8.9 12/22/2018 1032   ALKPHOS 80 02/27/2017 0809   AST 12 02/27/2017 0809   ALT 9 02/27/2017 0809   BILITOT 0.4 02/27/2017 0809        Lab Results  Component Value Date   WBC 16.0 (H) 12/22/2018   HGB 12.2 12/22/2018   HCT 37.2 12/22/2018  MCV 85.5 12/22/2018   PLT 227.0 12/22/2018       EOS                                                               0.1                                    12/22/2018       Lab Results  Component Value Date   TSH 1.14 12/22/2018     Lab Results  Component Value Date   PROBNP 29.0 12/22/2018      Labs ordered 12/22/2018  Alpha one AT screen            Assessment   COPD GOLD ? still smoking Active smoker - - 12/22/2018  After extensive coaching inhaler device,  effectiveness =    90% with dpi, 75% with hfa so continue trelegy pending pfts   If this is copd,  Group D in terms of symptom/risk and laba/lama/ICS  therefore appropriate rx at this point >>>  trelegy approp for now      DOE (dyspnea on exertion) Onset 2015 - 12/22/2018   Walked RA  2 laps @  approx 245ft each @ fast pace  stopped due to  Min sob/ sats 99%   Symptoms are markedly disproportionate to objective findings and not clear to what extent this is actually a pulmonary  problem but pt does appear to have difficult to sort out respiratory symptoms of unknown origin for which  DDX  = almost all start with A and  include Adherence, Ace Inhibitors, Acid Reflux, Active Sinus Disease, Alpha 1 Antitripsin deficiency, Anxiety masquerading as Airways dz,  ABPA,  Allergy(esp in young), Aspiration (esp in elderly), Adverse effects of meds,  Active smoking or Vaping, A bunch of PE's/clot burden (a few small clots can't cause this syndrome unless there is already severe underlying pulm or vascular dz with poor reserve),  Anemia or thyroid disorder, plus two Bs  = Bronchiectasis  and Beta blocker use..and one C= CHF     Adherence is always the initial "prime suspect" and is a multilayered concern that requires a "trust but verify" approach in every patient - starting with knowing how to use medications, especially inhalers, correctly, keeping up with refills and understanding the fundamental difference between maintenance and prns vs those medications only taken for a very short course and then stopped and not refilled.  - see hfa teaching under copd a/p - return with all meds in hand using a trust but verify approach to confirm accurate Medication  Reconciliation The principal here is that until we are certain that the  patients are doing what we've asked, it makes no sense to ask them to do more.    Active smoking (see separate a/p)   Allergy/ asthma > Eos 0 but just finished prednisone and  does apparently improve on systemic steroids by hx and therefore should do fine to use ICS   if uses consistently and effectively, discussed   ? Alpha one screen > sent   ? Anxiety > usually at the bottom of this list of usual suspects but should be incluced on this pt's based  on H and P and  may interfere with adherence / ability to quit smoking and also interpretation of response or lack thereof to symptom management which can be quite subjective.   Anemia/ thyroid dz excluded by today 's labs  ? chf > excluded by bnp less than 100      Irritable bowel syndrome with diarrhea May be contributing to poor abd compliance and low K+ (see separate a/p)   rec  Try change to pepcid 20 mg bid Diet reviewed Citrucel 1 tsp bid    Cigarette smoker 4-5 min discussion re active cigarette smoking in addition to office E&M  Ask about tobacco use:  ongoing Advise quitting:    I reviewed the Fletcher curve with the patient that basically indicates  if you quit smoking when your best day FEV1 is still well preserved (as is probably still  the case here)  it is highly unlikely you  will progress to severe disease and informed the patient there was  no medication on the market that has proven to alter the curve/ its downward trajectory  or the likelihood of progression of their disease(unlike other chronic medical conditions such as atheroclerosis where we do think we can change the natural hx with risk reducing meds)    Therefore stopping smoking and maintaining abstinence are  the most important aspects of care, not choice of inhalers or for that matter, doctors.   Treatment other than smoking cessation  is entirely directed by severity of symptoms and focused also on reducing exacerbations, not attempting to change the natural history of the disease.   Assess willingness:  Not committed at this point Assist in quit attempt:  Per PCP when ready Arrange follow up:   Follow up per Primary Care planned  For smoking cessation classes call [phone]       Hypokalemia due to excessive gastrointestinal loss of potassium Likely from diarrea / ibs > see sep a/p and start kdur 40 meq with f/u by PCP in one week        Total time devoted to counseling  > 50 % of initial 60 min office visit:  review case with pt/personally observed portions of amb 02 sat study/   device teaching which extended face to face time for this visit  discussion of options/alternatives/ personally creating written customized instructions  in presence of pt  then going over those specific  Instructions directly with the pt including how to use all of the meds but in particular covering each new medication in detail and the difference between the maintenance= "automatic" meds and the prns using an action plan format for the latter (If this problem/symptom => do that organization reading Left to right).  Please see AVS from this visit for a full list of these instructions which I personally wrote for this pt and  are unique to this visit.      Sandrea Hughs, MD 12/22/2018

## 2018-12-22 NOTE — Telephone Encounter (Signed)
Received call from Lab [facility] regarding critcal potassium level.  Routed to Dr. Sherene Sires for review  Contains critical data Basic metabolic panel  Order: [id number]  Status:  Final result  Visible to patient:  No (Not Released)  Next appt:  01/19/2019 at 09:00 AM in Pulmonology ([facility])  Dx:  DOE (dyspnea on exertion)   Ref Range & Units 10:32 58yr ago 70yr ago 79yr ago 48yr ago 15yr ago 58yr ago  Sodium 135 - 145 mEq/L 140  141 R 136 R 142 R 143 R 137    Potassium 3.5 - 5.1 mEq/L 2.8Low Panic   3.5 R 3.5 R 3.9 R 4.1 R 3.9 R 3.5   Chloride 96 - 112 mEq/L 102  102 R 106 R 105 R 104 R 106    CO2 19 - 32 mEq/L 28  23 R, CM 25 R 22 R 23 R 22    Glucose, Bld 70 - 99 mg/dL 51ZGY   95 R 93 R 91 R 91 R 95    BUN 6 - 23 mg/dL 12  13 R 6 R 6 R 7 R 9    Creatinine, Ser 0.40 - 1.20 mg/dL 1.74  9.44 R 9.67 R 5.91 R 0.71 R 0.80 R   Calcium 8.4 - 10.5 mg/dL 8.9  9.2 R 6.3WGY  R 8.9 R 9.0 R 8.8    GFR >60.00 mL/min 67.16

## 2018-12-22 NOTE — Assessment & Plan Note (Signed)
May be contributing to poor abd compliance  rec  Try change to pepcid 20 mg bid Diet reviewed Citrucel 1 tsp bid

## 2018-12-22 NOTE — Assessment & Plan Note (Signed)
Active smoker - - 12/22/2018  After extensive coaching inhaler device,  effectiveness =    90% with dpi, 75% with hfa so continue trelegy pending pfts   If this is copd,  Group D in terms of symptom/risk and laba/lama/ICS  therefore appropriate rx at this point >>>  trelegy approp for now

## 2018-12-22 NOTE — Assessment & Plan Note (Addendum)
Onset 2015 - 12/22/2018   Walked RA  2 laps @  approx 298ft each @ fast pace  stopped due to  Min sob/ sats 99%   Symptoms are markedly disproportionate to objective findings and not clear to what extent this is actually a pulmonary  problem but pt does appear to have difficult to sort out respiratory symptoms of unknown origin for which  DDX  = almost all start with A and  include Adherence, Ace Inhibitors, Acid Reflux, Active Sinus Disease, Alpha 1 Antitripsin deficiency, Anxiety masquerading as Airways dz,  ABPA,  Allergy(esp in young), Aspiration (esp in elderly), Adverse effects of meds,  Active smoking or Vaping, A bunch of PE's/clot burden (a few small clots can't cause this syndrome unless there is already severe underlying pulm or vascular dz with poor reserve),  Anemia or thyroid disorder, plus two Bs  = Bronchiectasis and Beta blocker use..and one C= CHF     Adherence is always the initial "prime suspect" and is a multilayered concern that requires a "trust but verify" approach in every patient - starting with knowing how to use medications, especially inhalers, correctly, keeping up with refills and understanding the fundamental difference between maintenance and prns vs those medications only taken for a very short course and then stopped and not refilled.  - see hfa teaching under copd a/p - return with all meds in hand using a trust but verify approach to confirm accurate Medication  Reconciliation The principal here is that until we are certain that the  patients are doing what we've asked, it makes no sense to ask them to do more.    Active smoking (see separate a/p)   Allergy/ asthma > Eos 0 but just finished prednisone and  does apparently improve on systemic steroids by hx and therefore should do fine to use ICS   if uses consistently and effectively, discussed   ? Alpha one screen > sent   ? Anxiety > usually at the bottom of this list of usual suspects but should be incluced on  this pt's based on H and P and  may interfere with adherence / ability to quit smoking and also interpretation of response or lack thereof to symptom management which can be quite subjective.   Anemia/ thyroid dz excluded by today 's labs  ? chf > excluded by bnp less than 100

## 2018-12-22 NOTE — Assessment & Plan Note (Signed)
Likely from diarrea / ibs > see sep a/p and start kdur 40 meq with f/u by PCP in one week     Total time devoted to counseling  > 50 % of initial 60 min office visit:  review case with pt/personally observed portions of amb 02 sat study/   device teaching which extended face to face time for this visit  discussion of options/alternatives/ personally creating written customized instructions  in presence of pt  then going over those specific  Instructions directly with the pt including how to use all of the meds but in particular covering each new medication in detail and the difference between the maintenance= "automatic" meds and the prns using an action plan format for the latter (If this problem/symptom => do that organization reading Left to right).  Please see AVS from this visit for a full list of these instructions which I personally wrote for this pt and  are unique to this visit.

## 2018-12-23 ENCOUNTER — Other Ambulatory Visit: Payer: Self-pay

## 2018-12-23 MED ORDER — POTASSIUM CHLORIDE CRYS ER 20 MEQ PO TBCR: 40.0000 meq | EXTENDED_RELEASE_TABLET | Freq: Every day | ORAL | 0 refills | Status: DC

## 2018-12-27 LAB — ALPHA-1 ANTITRYPSIN PHENOTYPE: A-1 Antitrypsin, Ser: 89 mg/dL (ref 83–199)

## 2018-12-28 ENCOUNTER — Telehealth: Payer: Self-pay | Admitting: Internal Medicine

## 2018-12-28 NOTE — Telephone Encounter (Signed)
Called the patient back and advised her that based on the results per Dr. 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  Reminded patient of the PFT with OV on 01/19/19 and not to use her rescue medications 3 hours prior. Patient voiced understanding. Nothing further needed at this time.

## 2018-12-28 NOTE — Progress Notes (Signed)
LMTCB

## 2019-01-03 ENCOUNTER — Telehealth: Payer: Self-pay | Admitting: Family Medicine

## 2019-01-03 DIAGNOSIS — E876 Hypokalemia: Secondary | ICD-10-CM

## 2019-01-03 DIAGNOSIS — Z79899 Other long term (current) drug therapy: Secondary | ICD-10-CM

## 2019-01-03 NOTE — Telephone Encounter (Signed)
Met 7 

## 2019-01-03 NOTE — Telephone Encounter (Signed)
Pt saw Dr. Sherene Sires and placed her on potassium for one week and advised her to contact us to order a lab to have that checked. If it has not improved and she needs more of it she would like it in liquid form.

## 2019-01-03 NOTE — Telephone Encounter (Signed)
Blood work ordered in Epic. Left message to return call to notify patient. 

## 2019-01-04 NOTE — Telephone Encounter (Signed)
Patient notified

## 2019-01-05 LAB — BASIC METABOLIC PANEL
BUN/Creatinine Ratio: 7 — ABNORMAL LOW (ref 9–23)
BUN: 6 mg/dL (ref 6–24)
CO2: 21 mmol/L (ref 20–29)
Calcium: 9.3 mg/dL (ref 8.7–10.2)
Chloride: 102 mmol/L (ref 96–106)
Creatinine, Ser: 0.83 mg/dL (ref 0.57–1.00)
GFR calc Af Amer: 98 mL/min/{1.73_m2} (ref >59)
GFR calc non Af Amer: 85 mL/min/{1.73_m2} (ref >59)
Glucose: 89 mg/dL (ref 65–99)
Potassium: 3.8 mmol/L (ref 3.5–5.2)
Sodium: 139 mmol/L (ref 134–144)

## 2019-01-11 NOTE — Addendum Note (Signed)
Addended by: Meredith Leeds on: 01/11/2019 11:42 AM   Modules accepted: Orders

## 2019-01-15 LAB — BASIC METABOLIC PANEL
BUN/Creatinine Ratio: 8 — ABNORMAL LOW (ref 9–23)
BUN: 7 mg/dL (ref 6–24)
CO2: 20 mmol/L (ref 20–29)
Calcium: 9.3 mg/dL (ref 8.7–10.2)
Chloride: 102 mmol/L (ref 96–106)
Creatinine, Ser: 0.88 mg/dL (ref 0.57–1.00)
GFR calc Af Amer: 91 mL/min/{1.73_m2} (ref >59)
GFR calc non Af Amer: 79 mL/min/{1.73_m2} (ref >59)
Glucose: 75 mg/dL (ref 65–99)
Potassium: 4.1 mmol/L (ref 3.5–5.2)
Sodium: 137 mmol/L (ref 134–144)

## 2019-01-19 ENCOUNTER — Ambulatory Visit: Payer: 59 | Admitting: Internal Medicine

## 2019-02-03 ENCOUNTER — Telehealth: Payer: Self-pay | Admitting: Internal Medicine

## 2019-02-03 ENCOUNTER — Other Ambulatory Visit: Payer: Self-pay | Admitting: Internal Medicine

## 2019-02-03 NOTE — Telephone Encounter (Signed)
Called and spoke to patient. Was able to schedule patient for COVID testing. Nothing further needed at this time.

## 2019-02-10 ENCOUNTER — Other Ambulatory Visit (HOSPITAL_COMMUNITY)
Admission: RE | Admit: 2019-02-10 | Discharge: 2019-02-10 | Disposition: A | Discharge status: Home / Self Care | Payer: 59 | Source: Ambulatory Visit | Attending: Internal Medicine | Admitting: Internal Medicine

## 2019-02-10 ENCOUNTER — Other Ambulatory Visit: Payer: Self-pay

## 2019-02-10 DIAGNOSIS — Z1159 Encounter for screening for other viral diseases: Secondary | ICD-10-CM | POA: Insufficient documentation

## 2019-02-11 ENCOUNTER — Ambulatory Visit: Payer: 59 | Admitting: Family Medicine

## 2019-02-11 ENCOUNTER — Encounter: Payer: Self-pay | Admitting: Family Medicine

## 2019-02-11 ENCOUNTER — Other Ambulatory Visit: Payer: Self-pay

## 2019-02-11 VITALS — BP 122/84 | Temp 97.9°F | Wt 226.6 lb

## 2019-02-11 DIAGNOSIS — R21 Rash and other nonspecific skin eruption: Secondary | ICD-10-CM

## 2019-02-11 LAB — NOVEL CORONAVIRUS, NAA (HOSP ORDER, SEND-OUT TO REF LAB; TAT 18-24 HRS): SARS-CoV-2, NAA: NOT DETECTED

## 2019-02-11 MED ORDER — FLUCONAZOLE 150 MG PO TABS: ORAL_TABLET | ORAL | 0 refills | Status: DC

## 2019-02-11 MED ORDER — KETOCONAZOLE 2 % EX CREA: 1.0000 "application " | TOPICAL_CREAM | Freq: Two times a day (BID) | CUTANEOUS | 1 refills | Status: DC

## 2019-02-11 NOTE — Progress Notes (Signed)
   Subjective:    Patient ID: Brandy Hensley, female    DOB: [birth year], 48 y.o.   MRN: [id number]  HPI  Patient arrives with vaginal discharge and irritation in her private area for a few days.  Patient has had progressive irritating rash in the inguinal area for the past week.  Left greater than right.  Pruritic at times.  No recent antibiotics  Now an element of vaginal discharge associated.  Itchy in nature.  Review of Systems No headache, no major weight loss or weight gain, no chest pain no back pain abdominal pain no change in bowel habits complete ROS otherwise negative     Objective:   Physical Exam  Alert vitals stable, NAD. Blood pressure good on repeat. HEENT normal. Lungs clear. Heart regular rate and rhythm. Inguinal rash with yeastlike features.  No obvious discharge external genitalia      Assessment & Plan:  Impression probable yeast intertrigo dermatitis with secondary seeding of vaginal region.  Discussed.  Will cover with both topical and oral antiyeast agents

## 2019-02-14 ENCOUNTER — Ambulatory Visit: Payer: 59 | Admitting: Internal Medicine

## 2019-02-14 ENCOUNTER — Ambulatory Visit (INDEPENDENT_AMBULATORY_CARE_PROVIDER_SITE_OTHER): Payer: 59 | Admitting: Internal Medicine

## 2019-02-14 ENCOUNTER — Encounter: Payer: Self-pay | Admitting: Internal Medicine

## 2019-02-14 ENCOUNTER — Other Ambulatory Visit: Payer: Self-pay

## 2019-02-14 DIAGNOSIS — R0609 Other forms of dyspnea: Secondary | ICD-10-CM

## 2019-02-14 DIAGNOSIS — F17210 Nicotine dependence, cigarettes, uncomplicated: Secondary | ICD-10-CM | POA: Diagnosis not present

## 2019-02-14 DIAGNOSIS — J449 Chronic obstructive pulmonary disease, unspecified: Secondary | ICD-10-CM

## 2019-02-14 LAB — PULMONARY FUNCTION TEST
DL/VA % pred: 85 %
DL/VA: 3.64 ml/min/mmHg/L
DLCO unc % pred: 76 %
DLCO unc: 18.12 ml/min/mmHg
FEF 25-75 Post: 1.9 L/sec
FEF 25-75 Pre: 1.6 L/sec
FEF2575-%Change-Post: 19 %
FEF2575-%Pred-Post: 61 %
FEF2575-%Pred-Pre: 51 %
FEV1-%Change-Post: 2 %
FEV1-%Pred-Post: 79 %
FEV1-%Pred-Pre: 77 %
FEV1-Post: 2.54 L
FEV1-Pre: 2.48 L
FEV1FVC-%Change-Post: -2 %
FEV1FVC-%Pred-Pre: 87 %
FEV6-%Change-Post: 5 %
FEV6-%Pred-Post: 92 %
FEV6-%Pred-Pre: 88 %
FEV6-Post: 3.61 L
FEV6-Pre: 3.43 L
FEV6FVC-%Change-Post: 0 %
FEV6FVC-%Pred-Post: 101 %
FEV6FVC-%Pred-Pre: 100 %
FVC-%Change-Post: 5 %
FVC-%Pred-Post: 92 %
FVC-%Pred-Pre: 87 %
FVC-Post: 3.68 L
FVC-Pre: 3.49 L
Post FEV1/FVC ratio: 69 %
Post FEV6/FVC ratio: 99 %
Pre FEV1/FVC ratio: 71 %
Pre FEV6/FVC Ratio: 99 %
RV % pred: 125 %
RV: 2.32 L
TLC % pred: 106 %
TLC: 5.86 L

## 2019-02-14 NOTE — Progress Notes (Signed)
Full PFT performed today. °

## 2019-02-14 NOTE — Progress Notes (Signed)
Brandy Hensley, female    DOB: July 19, 1971      MRN: [id number]   Brief patient profile:  68 yowf active smoker/ MZ no troubles with doe onset around 2015 first with steps then gradually worse to point of walking at grocery store shopping but started albuterol hfa  x around 2018 ? Helped / nebulizer may have helped more and so trelegy was started 12/10/18 and improved doe but not back to baseline so referred to pulmonary clinic 12/22/2018 by Dr   Wolfgang Phoenix      History of Present Illness  12/22/2018  Pulmonary/ 1st office eval/Aidee Latimore  Chief Complaint  Patient presents with  . Pulmonary Consult    Referred by Dr. Wolfgang Phoenix. Pt c/o DOE and chest tightness for the past year, progressively worse for the past 6 months. She gets winded walking to her mailbox.   Dyspnea:  MMRC2 = can't walk a nl pace on a flat grade s sob but does fine slow and flat  Cough: better now / does not tend to be worse in am/ min mucoid sputum  Sleep: on side/ bed is flat SABA use: neb maybe twice since started and much better took prednisone and finished one day prior to OV   mb is about 100 ft slt incline back to house where  has to stop at landing on return  and a little better since trelegy rec Treatment consists of avoiding foods that cause gas (especially boiled eggs, mexcican food but especially  beans and undercooked vegetables like  spinach and some salads)  and citrucel 1 heaping tsp twice daily with a large glass of water.  Pain should improve w/in 2 weeks and if not then consider further GI work up.  Plan A = Automatic = Trelegy one click each am take two good drags Plan B = Backup Only use your albuterol inhaler as a rescue medication  Plan C = Crisis - only use your albuterol nebulizer if you first try Plan B and it fails to help > ok to use the nebulizer up to every 4 hours but if start needing it regularly call for immediate appointment Stop protonix and replace with pepcid 20 mg after bfast and after supper (otc)   GERD  The key is to stop smoking completely before smoking completely stops you! - For smoking cessation information call [phone]  Please remember to go to the lab department   for your tests - we will call you with the results when they are available.  Please schedule a follow up office visit in 4 weeks, sooner if needed  with all medications /inhalers/ solutions in hand so we can verify exactly what you are taking. This includes all medications from all doctors and over the counters  - PFTs on return    02/14/2019  Extended final f/u ov/Dannah Ryles re: doe / smoker/ nl pfts  Chief Complaint  Patient presents with  . Follow-up    PFT's done. Breathing is overall doing well and she states she rarely has to use albuterol inhaler or neb.   Dyspnea:  Some better but still = MMRC2 even on trelegy  Cough: no  Sleeping: no resp symptoms bed is flat, 2 pillows  SABA use: rarely need 02: no Overt hb on pepcid 20 mg bid ? Compliant   No obvious day to day or daytime variability or assoc excess/ purulent sputum or mucus plugs or hemoptysis or cp or chest tightness, subjective wheeze or overt sinus  symptoms.   Sleeping  without nocturnal  or early am exacerbation  of respiratory  c/o's or need for noct saba. Also denies any obvious fluctuation of symptoms with weather or environmental changes or other aggravating or alleviating factors except as outlined above   No unusual exposure hx or h/o childhood pna/ asthma or knowledge of premature birth.  Current Allergies, Complete Past Medical History, Past Surgical History, Family History, and Social History were reviewed in [facility] electronic medical record.  ROS  The following are not active complaints unless bolded Hoarseness, sore throat, dysphagia, dental problems, itching, sneezing,  nasal congestion or discharge of excess mucus or purulent secretions, ear ache,   fever, chills, sweats, unintended wt loss or wt gain, classically pleuritic  or exertional cp,  orthopnea pnd or arm/hand swelling  or leg swelling, presyncope, palpitations, abdominal pain, anorexia, nausea, vomiting, diarrhea  or change in bowel habits or change in bladder habits, change in stools or change in urine, dysuria, hematuria,  rash, arthralgias, visual complaints, headache, numbness, weakness or ataxia or problems with walking or coordination,  change in mood or  memory.        Current Meds  Medication Sig  . albuterol (PROVENTIL) (2.5 MG/3ML) 0.083% nebulizer solution Take 3 mLs (2.5 mg total) every 6 (six) hours as needed by nebulization for wheezing or shortness of breath.  Marland Kitchen. albuterol (VENTOLIN HFA) 108 (90 Base) MCG/ACT inhaler Inhale 2 puffs into the lungs every 6 (six) hours as needed for wheezing or shortness of breath.  . cyclobenzaprine (FLEXERIL) 10 MG tablet TAKE 1 TABLET BY MOUTH 3 TIMES A DAY AS NEEDED FOR MUSCLE SPASMS  . fluconazole (DIFLUCAN) 150 MG tablet One tablet 3 days apart  . Fluticasone-Umeclidin-Vilant (TRELEGY ELLIPTA) 100-62.5-25 MCG/INH AEPB Inhale 1 puff into the lungs daily.  Marland Kitchen. ketoconazole (NIZORAL) 2 % cream Apply 1 application topically 2 (two) times daily.  . Multiple Vitamins-Minerals (EQ MULTIVITAMINS ADULT GUMMY PO) Take by mouth 2 (two) times daily.  . Simethicone (GAS-X PO) Take by mouth as needed.              Objective:    Wt Readings from Last 3 Encounters:  02/14/19 224 lb (101.6 kg)  02/11/19 226 lb 9.6 oz (102.8 kg)  12/22/18 224 lb (101.6 kg)     Vital signs reviewed - Note on arrival 02 sats  94% on RA     amb somber wf nad   HEENT: nl dentition, turbinates bilaterally, and oropharynx. Nl external ear canals without cough reflex   NECK :  without JVD/Nodes/TM/ nl carotid upstrokes bilaterally   LUNGS: no acc muscle use,  Nl contour chest which is clear to A and P bilaterally without cough on insp or exp maneuvers   CV:  RRR  no s3 or murmur or increase in P2, and no edema   ABD:  soft and  nontender with nl inspiratory excursion in the supine position. No bruits or organomegaly appreciated, bowel sounds nl  MS:  Nl gait/ ext warm without deformities, calf tenderness, cyanosis or clubbing No obvious joint restrictions   SKIN: warm and dry without lesions    NEURO:  alert, approp, nl sensorium with  no motor or cerebellar deficits apparent.                        Assessment

## 2019-02-14 NOTE — Patient Instructions (Addendum)
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 As you improve, it will actually be easier for you to do the same amount of exercise  in  30 minutes so always push to the level where you are short of breath.     If you are satisfied with your treatment plan,  let your doctor know and he/she can either refill your medications or you can return here when your prescription runs out.     If in any way you are not 100% satisfied,  please tell us.  If 100% better, tell your friends!  Pulmonary follow up is as needed

## 2019-02-15 ENCOUNTER — Encounter: Payer: Self-pay | Admitting: Internal Medicine

## 2019-02-15 MED ORDER — PANTOPRAZOLE SODIUM 40 MG PO TBEC: DELAYED_RELEASE_TABLET | ORAL | 2 refills | Status: DC

## 2019-02-15 MED ORDER — FAMOTIDINE 20 MG PO TABS: ORAL_TABLET | ORAL | Status: DC

## 2019-02-15 NOTE — Assessment & Plan Note (Addendum)
Onset 2015 - 12/22/2018   Walked RA  2 laps @  approx 246ft each @ fast pace  stopped due to  [place] sob/ sats 99%  - PFT's  02/14/2019  FEV1 2.54 (79 % ) ratio 0.69  p 2 % improvement from saba p nothing prior to study with DLCO  76 % corrects to 85 % for alv volume  With ERV 33     Clearly her doe is not likely to be related to airflow obst but more likely due to conditioning/ body habitus with main finding being reduction in ERV

## 2019-02-15 NOTE — Assessment & Plan Note (Addendum)
Active smoker - - 12/22/2018  After extensive coaching inhaler device,  effectiveness =    90% with dpi, 75% with hfa so continue trelegy pending pfts  - Alpha One AT screen 12/22/2018   MZ  Level 89 -- PFT's  02/14/2019  FEV1 2.54 (79 % ) ratio 0.69  p 2 % improvement from saba p nothing prior to study with DLCO  76 % corrects to 85 % for alv volume  With ERV 33 > try off trelegy  Comment: She has very minimal airflow obst off trelegy and still actively smoking with MZ phenotype so it makes more sense to me at this point  to focus on stopping smoking a just use trelegy if she starts having flares or notes greater need for saba off it.

## 2019-02-15 NOTE — Assessment & Plan Note (Signed)
ERV 33% by pfts 02/14/2019   Body mass index is 35.08 kg/m.    Lab Results  Component Value Date   TSH 1.14 12/22/2018     Contributing to gerd/  doe/reviewed the need and the process to achieve and maintain neg calorie balance > defer f/u primary care including intermittently monitoring thyroid status    rec add back ppi q am ac and work on wt loss see avs for instructions unique to this ov

## 2019-02-15 NOTE — Assessment & Plan Note (Signed)
Counseled re importance of smoking cessation but did not meet time criteria for separate billing   Did advise on significance of MZ both for her and members of her fm who actively smoke    I had an extended discussion with the patient reviewing all relevant studies completed to date and  lasting 25 minutes of a 40  minute final summary office visit addressig non-specific but potentially very serious refractory respiratory symptoms of uncertain and potentially multiple  etiologies.   Each maintenance medication was reviewed in detail including most importantly the difference between maintenance and prns and under what circumstances the prns are to be triggered using an action plan format that is not reflected in the computer generated alphabetically organized AVS.    Please see AVS for specific instructions unique to this office visit that I personally wrote and verbalized to the the pt in detail and then reviewed with pt  by my nurse highlighting any changes in therapy/plan of care  recommended at today's visit.

## 2019-02-21 ENCOUNTER — Telehealth: Payer: Self-pay | Admitting: Family Medicine

## 2019-02-21 NOTE — Telephone Encounter (Signed)
Pt was seen in office 6/19 for yeast infection. She is calling in this morning stating it has not improved much and she is still having yellowish green discharge. She would like to know if something else needs to be called in or if she needs to do another follow up in office.   [id number] - [place], [place] [address]

## 2019-02-21 NOTE — Telephone Encounter (Signed)
Sent pt a my chart message to set up appt.

## 2019-02-22 ENCOUNTER — Other Ambulatory Visit: Payer: Self-pay

## 2019-02-22 ENCOUNTER — Ambulatory Visit: Payer: 59 | Admitting: Family Medicine

## 2019-02-22 VITALS — BP 114/76 | Temp 97.0°F | Ht 67.0 in | Wt 225.0 lb

## 2019-02-22 DIAGNOSIS — R21 Rash and other nonspecific skin eruption: Secondary | ICD-10-CM | POA: Diagnosis not present

## 2019-02-22 MED ORDER — CEPHALEXIN 500 MG PO CAPS: 500.0000 mg | ORAL_CAPSULE | Freq: Three times a day (TID) | ORAL | 0 refills | Status: DC

## 2019-02-22 MED ORDER — FLUCONAZOLE 150 MG PO TABS: ORAL_TABLET | ORAL | 0 refills | Status: DC

## 2019-02-22 MED ORDER — MUPIROCIN 2 % EX OINT: 1.0000 "application " | TOPICAL_OINTMENT | Freq: Two times a day (BID) | CUTANEOUS | 0 refills | Status: DC

## 2019-02-22 NOTE — Telephone Encounter (Signed)
Patient was seen by Dr Richardson Landry in the office today (02/22/19) for this concern

## 2019-02-22 NOTE — Progress Notes (Signed)
   Subjective:    Patient ID: Brandy Hensley, female    DOB: 1970/12/31, 48 y.o.   MRN: [id number]  [place] on 6/19 and prescribed diflucan and ketoconazole cream. Pt states she having green discharge and vaginal itching and burning.  Patient brings pictures of her panties.  Impressive amount of discharge greenish-yellowish in nature.  Improving at the lateral edge of the pannus.  She does not feel this is true vaginal discharge.  No fever no chills  Feels no lump or bump or tenderness just irritated and inflamed somewhat sensitive area which improves a crusty discharge over time.  Current medications not help    Review of Systems No headache, no major weight loss or weight gain, no chest pain no back pain abdominal pain no change in bowel habits complete ROS otherwise negative     Objective:   Physical Exam  Alert vitals stable, NAD. Blood pressure good on repeat. HEENT normal. Lungs clear. Heart regular rate and rhythm. Vaginal exam discharge obtained not impressive under the microscope  Inguinal rash minimal.  Not really enough to justify amount of discharge seen on picture      Assessment & Plan:  Impression puzzling accrual of discharge on panties which appears like a skin infection or skin structure infection yet relative lack difficulty on exam.  Discussed with patient.  Will give a trial of antibiotics plus antibiotic ointment

## 2019-02-28 ENCOUNTER — Other Ambulatory Visit: Payer: Self-pay

## 2019-02-28 ENCOUNTER — Ambulatory Visit (INDEPENDENT_AMBULATORY_CARE_PROVIDER_SITE_OTHER): Payer: 59 | Admitting: Family Medicine

## 2019-02-28 DIAGNOSIS — J449 Chronic obstructive pulmonary disease, unspecified: Secondary | ICD-10-CM | POA: Diagnosis not present

## 2019-02-28 DIAGNOSIS — F411 Generalized anxiety disorder: Secondary | ICD-10-CM | POA: Diagnosis not present

## 2019-02-28 MED ORDER — BUPROPION HCL ER (SR) 150 MG PO TB12: ORAL_TABLET | ORAL | 5 refills | Status: DC

## 2019-02-28 NOTE — Progress Notes (Signed)
   Subjective:  Audio only 14  Patient ID: Brandy Hensley, female    DOB: [birth year], 48 y.o.   MRN: [id number]  HPIFollow up on seeing pulmologist on June 27th. Pt saw Dr. Melvyn Novas. Pt states he turned her back over to Dr. Richardson Landry. Feeling like she can not get enough air in. Happens a couple times a day.   Virtual Visit via Video Note  I connected with Kendrea L Yaney on 02/28/19 at  9:30 AM EDT by a video enabled telemedicine application and verified that I am speaking with the correct person using two identifiers.  Location: Patient: home Provider: office   I discussed the limitations of evaluation and management by telemedicine and the availability of in person appointments. The patient expressed understanding and agreed to proceed.  History of Present Illness:    Observations/Objective:   Assessment and Plan:   Follow Up Instructions:    I discussed the assessment and treatment plan with the patient. The patient was provided an opportunity to ask questions and all were answered. The patient agreed with the plan and demonstrated an understanding of the instructions.   The patient was advised to call back or seek an in-person evaluation if the symptoms worsen or if the condition fails to improve as anticipated.  I provided28 minutes of non-face-to-face time during this encounter.  In presence of patient complaint evaluation via the pulmonary specialist was discussed at great length.  The patient did fit the criteria for COPD based on pulmonary function test smoking cessation was strongly encouraged at that visit.  In addition patient was given a preventive trial of the prescription Trelegy of which she has used only so-so   Patient admits to considerable stress at times also.  Feeling down.  Anxious.  Generalized anxiety often.  A lot of stress in her life  The often leads to a sense of of shortness of breath.  When at rest.   Review of Systems No headache, no major weight loss or  weight gain, no chest pain no back pain abdominal pain no change in bowel habits complete ROS otherwise negative     Objective:   Physical Exam Virtual       Assessment & Plan:  Impression COPD.  Discussed at length.  Patient concerned about this appropriately.  Smoking cessation discussed at length  Generalized anxiety disorder.  Leading to challenges with occasional dyspnea also substantial challenges with sleep and overall mood  Initiate Wellbutrin.  Rationale discussed.  Encouraged to maintain Trelegy.  Diet and exercise discussed follow-up as scheduled no

## 2019-03-05 ENCOUNTER — Encounter: Payer: Self-pay | Admitting: Family Medicine

## 2019-03-22 ENCOUNTER — Other Ambulatory Visit: Payer: Self-pay | Admitting: Family Medicine

## 2019-04-01 ENCOUNTER — Other Ambulatory Visit: Payer: Self-pay | Admitting: Family Medicine

## 2019-06-27 ENCOUNTER — Ambulatory Visit (INDEPENDENT_AMBULATORY_CARE_PROVIDER_SITE_OTHER): Payer: 59 | Admitting: Family Medicine

## 2019-06-27 ENCOUNTER — Other Ambulatory Visit: Payer: Self-pay

## 2019-06-27 DIAGNOSIS — K529 Noninfective gastroenteritis and colitis, unspecified: Secondary | ICD-10-CM

## 2019-06-27 MED ORDER — ONDANSETRON 4 MG PO TBDP: 4.0000 mg | ORAL_TABLET | Freq: Four times a day (QID) | ORAL | 0 refills | Status: DC | PRN

## 2019-06-27 MED ORDER — METRONIDAZOLE 500 MG PO TABS: 500.0000 mg | ORAL_TABLET | Freq: Three times a day (TID) | ORAL | 0 refills | Status: DC

## 2019-06-27 MED ORDER — CIPROFLOXACIN HCL 500 MG PO TABS: 500.0000 mg | ORAL_TABLET | Freq: Two times a day (BID) | ORAL | 0 refills | Status: DC

## 2019-06-27 NOTE — Progress Notes (Signed)
   Subjective:    Patient ID: Brandy Hensley, female    DOB: 09-29-1970, 48 y.o.   MRN: [id number]  Abdominal Pain This is a new problem. Episode onset: 3 days. Pain location: low abdominal pain all the way across but more so towards the left side. Associated symptoms include a fever and nausea. The pain is aggravated by movement. The pain is relieved by bowel movements. Treatments tried: advil/tylenol, colace.   Virtual Visit via Video Note  I connected with Brandy Hensley on 06/27/19 at  2:00 PM EST by a video enabled telemedicine application and verified that I am speaking with the correct person using two identifiers.  Location: Patient: home Provider: office   I discussed the limitations of evaluation and management by telemedicine and the availability of in person appointments. The patient expressed understanding and agreed to proceed.  History of Present Illness:    Observations/Objective:   Assessment and Plan:   Follow Up Instructions:    I discussed the assessment and treatment plan with the patient. The patient was provided an opportunity to ask questions and all were answered. The patient agreed with the plan and demonstrated an understanding of the instructions.   The patient was advised to call back or seek an in-person evaluation if the symptoms worsen or if the condition fails to improve as anticipated.  I provided 18 minutes of non-face-to-face time during this encounter.    Positive history of diverticulitis in the past.  Some similarities.  Low abdominal discomfort.  Also positive element of nausea.  Not vomiting. Pain seems to be more on the left side  Review of Systems  Constitutional: Positive for fever.  Gastrointestinal: Positive for abdominal pain and nausea.       Objective:   Physical Exam  Virtual      Assessment & Plan:  Impression gastroenteritis-like illness with potential for diverticulitis.  Discussed.  Zofran as needed for nausea.   Dietary changes discussed.  Cipro and metronidazole.  Warning signs discussed

## 2019-06-28 ENCOUNTER — Other Ambulatory Visit: Payer: Self-pay

## 2019-06-28 DIAGNOSIS — Z20822 Contact with and (suspected) exposure to covid-19: Secondary | ICD-10-CM

## 2019-06-30 LAB — NOVEL CORONAVIRUS, NAA: SARS-CoV-2, NAA: NOT DETECTED

## 2019-07-10 ENCOUNTER — Encounter: Payer: Self-pay | Admitting: Family Medicine

## 2019-07-29 ENCOUNTER — Other Ambulatory Visit: Payer: Self-pay

## 2019-07-29 ENCOUNTER — Ambulatory Visit (INDEPENDENT_AMBULATORY_CARE_PROVIDER_SITE_OTHER): Payer: 59 | Admitting: Family Medicine

## 2019-07-29 DIAGNOSIS — Z20822 Contact with and (suspected) exposure to covid-19: Secondary | ICD-10-CM

## 2019-07-29 DIAGNOSIS — J019 Acute sinusitis, unspecified: Secondary | ICD-10-CM | POA: Diagnosis not present

## 2019-07-29 DIAGNOSIS — Z20828 Contact with and (suspected) exposure to other viral communicable diseases: Secondary | ICD-10-CM | POA: Diagnosis not present

## 2019-07-29 MED ORDER — ALBUTEROL SULFATE HFA 108 (90 BASE) MCG/ACT IN AERS: 2.0000 | INHALATION_SPRAY | Freq: Four times a day (QID) | RESPIRATORY_TRACT | 1 refills | Status: DC | PRN

## 2019-07-29 MED ORDER — AZITHROMYCIN 250 MG PO TABS: ORAL_TABLET | ORAL | 0 refills | Status: DC

## 2019-07-29 NOTE — Progress Notes (Signed)
   Subjective:    Patient ID: Brandy Hensley, female    DOB: 11-25-1970, 48 y.o.   MRN: [id number]  Cough This is a new problem. The current episode started yesterday. Associated symptoms include rhinorrhea. Pertinent negatives include no chest pain, ear pain, fever, shortness of breath or wheezing. Associated symptoms comments: Stuffiness yesterday, congestion, sinus pressure, tightness in chest, feels lousy. Treatments tried: Mucinex for Sinus/Chest congestion  The treatment provided mild relief.  Patient sinus symptoms pressure not feeling good feeling rough the past few days no wheezing difficulty breathing.  No vomiting or nausea.  PMH benign reactive airway, COPD Virtual Visit via Video Note  I connected with Brandy Hensley on 07/29/19 at 10:00 AM EST by a video enabled telemedicine application and verified that I am speaking with the correct person using two identifiers.  Location: Patient: home Provider: office    I discussed the limitations of evaluation and management by telemedicine and the availability of in person appointments. The patient expressed understanding and agreed to proceed.  History of Present Illness:    Observations/Objective:   Assessment and Plan:   Follow Up Instructions:    I discussed the assessment and treatment plan with the patient. The patient was provided an opportunity to ask questions and all were answered. The patient agreed with the plan and demonstrated an understanding of the instructions.   The patient was advised to call back or seek an in-person evaluation if the symptoms worsen or if the condition fails to improve as anticipated.  I provided 16 minutes of non-face-to-face time during this encounter.   Vicente Males, LPN    Review of Systems  Constitutional: Negative for activity change and fever.  HENT: Positive for congestion and rhinorrhea. Negative for ear pain.   Eyes: Negative for discharge.  Respiratory: Positive for cough.  Negative for shortness of breath and wheezing.   Cardiovascular: Negative for chest pain.       Objective:   Physical Exam  Patient had virtual visit Appears to be in no distress Atraumatic Neuro able to relate and oriented No apparent resp distress Color normal       Assessment & Plan:  Patient with sinus symptoms coughing he has history of COPD  Antibiotics and albuterol refill sent in patient denies any shortness of breath currently  Warning signs regarding Covid were discussed in detail  Patient was strongly encouraged to do Covid testing today.

## 2019-08-01 LAB — NOVEL CORONAVIRUS, NAA: SARS-CoV-2, NAA: NOT DETECTED

## 2019-09-13 ENCOUNTER — Other Ambulatory Visit: Payer: Self-pay

## 2019-09-13 ENCOUNTER — Ambulatory Visit: Payer: 59 | Attending: Internal Medicine

## 2019-09-13 DIAGNOSIS — Z20822 Contact with and (suspected) exposure to covid-19: Secondary | ICD-10-CM

## 2019-09-14 LAB — NOVEL CORONAVIRUS, NAA: SARS-CoV-2, NAA: NOT DETECTED

## 2019-09-28 ENCOUNTER — Encounter: Payer: Self-pay | Admitting: Family Medicine

## 2019-10-16 ENCOUNTER — Other Ambulatory Visit: Payer: Self-pay

## 2019-10-16 ENCOUNTER — Ambulatory Visit
Admission: EM | Admit: 2019-10-16 | Discharge: 2019-10-16 | Disposition: A | Discharge status: Home / Self Care | Payer: 59 | Attending: Emergency Medicine | Admitting: Emergency Medicine

## 2019-10-16 DIAGNOSIS — T23029A Burn of unspecified degree of unspecified single finger (nail) except thumb, initial encounter: Secondary | ICD-10-CM

## 2019-10-16 MED ORDER — SILVER SULFADIAZINE 1 % EX CREA: 1.0000 "application " | TOPICAL_CREAM | Freq: Every day | CUTANEOUS | 0 refills | Status: DC

## 2019-10-16 MED ORDER — SILVER SULFADIAZINE 1 % EX CREA
TOPICAL_CREAM | Freq: Once | CUTANEOUS | Status: AC
  Administered 2019-10-16: 1 via TOPICAL

## 2019-10-16 NOTE — ED Triage Notes (Signed)
Pt presents with complaints of burn to second finger on the left hand. Pt states she burned her finger cooking x 4 days ago. Reports it is not getting better. The area is the size of a pencil eraser on the top of her finger, the area is red and swollen.

## 2019-10-16 NOTE — ED Provider Notes (Signed)
[facility]   [id number] 10/16/19 Arrival Time: 4034  CC: Burn  SUBJECTIVE:  Brandy Hensley is a 49 y.o. female who presents with a burn to LT pointer finger x 3 days ago.  Symptoms began while she was cooking and a piece of egg exploded and landed on her LT pointer finger.   Localizes the burn to LT pointer finger.  Describes it as painful, with surrounding redness and swelling.  Has tried OTC neosporin.  Symptoms are made worse to the touch.   Denies fever, chills, nausea, vomiting, discharge, SOB, chest pain, abdominal pain, changes in bowel or bladder function.    ROS: As per HPI.  All other pertinent ROS negative.     Past Medical History:  Diagnosis Date  . Diverticulitis   . GERD (gastroesophageal reflux disease)   . PONV (postoperative nausea and vomiting)    Past Surgical History:  Procedure Laterality Date  . CHOLECYSTECTOMY  09/10/2012   Procedure: LAPAROSCOPIC CHOLECYSTECTOMY;  Surgeon: Dalia Heading, MD;  Location: [facility];  Service: General;  Laterality: N/A;  . COLONOSCOPY  2005-2006   Dr.Rehman @[facility]   . TUBAL LIGATION     Allergies  Allergen Reactions  . Codeine Nausea And Vomiting    Skin on fire, Hot Flashes   . Flovent Diskus [Fluticasone Propionate (Inhal)] Other (See Comments)    Irritation ofthroat  . Augmentin [Amoxicillin-Pot Clavulanate] Diarrhea  . Contrave [Naltrexone-Bupropion Hcl Er] Palpitations  . Qsymia [Phentermine-Topiramate] Other (See Comments)    Tingling and muscle tightness   No current facility-administered medications on file prior to encounter.   Current Outpatient Medications on File Prior to Encounter  Medication Sig Dispense Refill  . albuterol (VENTOLIN HFA) 108 (90 Base) MCG/ACT inhaler Inhale 2 puffs into the lungs every 6 (six) hours as needed for wheezing or shortness of breath. 18 g 1  . azithromycin (ZITHROMAX Z-PAK) 250 MG tablet Take 2 tablets (500 mg) on  Day 1,  followed by 1 tablet (250 mg) once daily on Days 2  through 5. 6 each 0  . cyclobenzaprine (FLEXERIL) 10 MG tablet TAKE 1 TABLET BY MOUTH 3 TIMES A DAY AS NEEDED FOR MUSCLE SPASMS 30 tablet 1  . Multiple Vitamins-Minerals (EQ MULTIVITAMINS ADULT GUMMY PO) Take by mouth 2 (two) times daily.    . Simethicone (GAS-X PO) Take by mouth as needed.    . [DISCONTINUED] buPROPion (WELLBUTRIN SR) 150 MG 12 hr tablet TAKE 1 BY MOUTH EVERY MORNING FOR 3 DAYS, THEN 1 TABLET TWICE A DAY (Patient not taking: Reported on 06/27/2019) 180 tablet 0  . [DISCONTINUED] famotidine (PEPCID) 20 MG tablet One after supper (Patient not taking: Reported on 07/29/2019)    . [DISCONTINUED] pantoprazole (PROTONIX) 40 MG tablet Take 30-60 min before first meal of the day (Patient not taking: Reported on 07/29/2019) 90 tablet 2   Social History   Socioeconomic History  . Marital status: Divorced    Spouse name: Not on file  . Number of children: Not on file  . Years of education: Not on file  . Highest education level: Not on file  Occupational History  . Not on file  Tobacco Use  . Smoking status: Current Every Day Smoker    Packs/day: 1.00    Years: 31.00    Pack years: 31.00    Types: Cigarettes  . Smokeless tobacco: Never Used  Substance and Sexual Activity  . Alcohol use: No    Alcohol/week: 0.0 standard drinks  . Drug  use: No  . Sexual activity: Yes    Birth control/protection: Surgical  Other Topics Concern  . Not on file  Social History Narrative  . Not on file   Social Determinants of Health   Financial Resource Strain:   . Difficulty of Paying Living Expenses: Not on file  Food Insecurity:   . Worried About Programme researcher, broadcasting/film/video in the Last Year: Not on file  . Ran Out of Food in the Last Year: Not on file  Transportation Needs:   . Lack of Transportation (Medical): Not on file  . Lack of Transportation (Non-Medical): Not on file  Physical Activity:   . Days of Exercise per Week: Not on file  . Minutes of Exercise per Session: Not on file   Stress:   . Feeling of Stress : Not on file  Social Connections:   . Frequency of Communication with Friends and Family: Not on file  . Frequency of Social Gatherings with Friends and Family: Not on file  . Attends Religious Services: Not on file  . Active Member of Clubs or Organizations: Not on file  . Attends Banker Meetings: Not on file  . Marital Status: Not on file  Intimate Partner Violence:   . Fear of Current or Ex-Partner: Not on file  . Emotionally Abused: Not on file  . Physically Abused: Not on file  . Sexually Abused: Not on file   Family History  Problem Relation Age of Onset  . Hypertension Mother   . Osteoporosis Mother   . COPD Mother        smoked  . Heart attack Maternal Grandmother   . Cancer Maternal Grandmother 64       pancreatic cancer  . Heart disease Maternal Grandmother 76       MI/CABG  . Emphysema Father        smoked  . Lung cancer Father     OBJECTIVE: Vitals:   10/16/19 0845  BP: 128/83  Pulse: 75  Resp: 18  Temp: 98.2 F (36.8 C)  SpO2: 92%    General appearance: alert; no distress Head: NCAT Lungs: normal respiratory effort CV: LT Radial pulse 2+  Extremities: no edema Skin: warm and dry; 1 cm round superficial burn, healing with overlying scab formation, to LT proximal second digit on the dorsal aspect with mild surrounding swelling and erythema, mildly TTP, no obvious drainage or bleeding Psychological: alert and cooperative; normal mood and affect  ASSESSMENT & PLAN:  1. Burn injury of skin of finger     Meds ordered this encounter  Medications  . silver sulfADIAZINE (SILVADENE) 1 % cream    Sig: Apply 1 application topically daily.    Dispense:  50 g    Refill:  0    Order Specific Question:   Supervising Provider    Answer:   Eustace Moore [[id number]]  . silver sulfADIAZINE (SILVADENE) 1 % cream    Declines tetanus today Bandage applied with silvadene Burn appears to be healing well Wash with  warm water and mild soap Keep covered Silvadene cream prescribed to help prevent infection.  Use as directed.   Follow up with PCP if symptoms persists Return or go to the ER if you have any new or worsening symptoms such as fever, chills, nausea, vomiting, redness, swelling, discharge, if symptoms do not improve with medications, etc...  Reviewed expectations re: course of current medical issues. Questions answered. Outlined signs and symptoms indicating need for more  acute intervention. Patient verbalized understanding. After Visit Summary given.   Lestine Box, PA-C 10/16/19 [phone]

## 2019-10-16 NOTE — Discharge Instructions (Signed)
Declines tetanus today Bandage applied with silvadene Burn appears to be healing well Wash with warm water and mild soap Keep covered Silvadene cream prescribed to help prevent infection.  Use as directed.   Follow up with PCP if symptoms persists Return or go to the ER if you have any new or worsening symptoms such as fever, chills, nausea, vomiting, redness, swelling, discharge, if symptoms do not improve with medications, etc..Marland Kitchen

## 2020-09-20 ENCOUNTER — Other Ambulatory Visit: Payer: Self-pay | Admitting: *Deleted

## 2020-09-20 ENCOUNTER — Telehealth: Payer: Self-pay | Admitting: Family Medicine

## 2020-09-20 ENCOUNTER — Ambulatory Visit (INDEPENDENT_AMBULATORY_CARE_PROVIDER_SITE_OTHER): Payer: 59 | Admitting: Family Medicine

## 2020-09-20 ENCOUNTER — Other Ambulatory Visit: Payer: Self-pay

## 2020-09-20 ENCOUNTER — Encounter: Payer: Self-pay | Admitting: Family Medicine

## 2020-09-20 VITALS — BP 118/76 | HR 108 | Temp 97.8°F | Ht 67.0 in | Wt 213.0 lb

## 2020-09-20 DIAGNOSIS — N3000 Acute cystitis without hematuria: Secondary | ICD-10-CM

## 2020-09-20 DIAGNOSIS — B379 Candidiasis, unspecified: Secondary | ICD-10-CM

## 2020-09-20 DIAGNOSIS — Z13220 Encounter for screening for lipoid disorders: Secondary | ICD-10-CM

## 2020-09-20 DIAGNOSIS — R5383 Other fatigue: Secondary | ICD-10-CM

## 2020-09-20 DIAGNOSIS — Z0000 Encounter for general adult medical examination without abnormal findings: Secondary | ICD-10-CM

## 2020-09-20 DIAGNOSIS — R300 Dysuria: Secondary | ICD-10-CM

## 2020-09-20 LAB — POCT URINALYSIS DIPSTICK
Spec Grav, UA: <=1.005 — AB (ref 1.010–1.025)
pH, UA: 6 (ref 5.0–8.0)

## 2020-09-20 MED ORDER — NITROFURANTOIN MONOHYD MACRO 100 MG PO CAPS: 100.0000 mg | ORAL_CAPSULE | Freq: Two times a day (BID) | ORAL | 0 refills | Status: DC

## 2020-09-20 MED ORDER — FLUCONAZOLE 150 MG PO TABS: 150.0000 mg | ORAL_TABLET | Freq: Once | ORAL | 0 refills | Status: AC

## 2020-09-20 NOTE — Progress Notes (Signed)
Patient ID: Brandy Hensley, female    DOB: 03-22-1971, 50 y.o.   MRN: [id number]   No chief complaint on file.  Subjective:  CC: urinary buring  This is a new problem.  Presents today for an acute visit with a complaint of burning at the end of her urinary stream.  Symptoms started on Monday, associated symptoms include suprapubic pain and urinary frequency.  She also reports diarrhea, which is not a new symptom.  Denies fever, chills, chest pain, shortness of breath.  Her chest tightness is from her diagnosis of mild COPD.  She denies seeing any blood in her stool or urine.   5 days ago started having some burning with urination. Wave of nausea every once in awhile and headache. Tried increasing water intake.  Results for orders placed or performed in visit on 09/20/20  POCT urinalysis dipstick  Result Value Ref Range   Color, UA     Clarity, UA     Glucose, UA     Bilirubin, UA     Ketones, UA     Spec Grav, UA <=1.005 (A) 1.010 - 1.025   Blood, UA     pH, UA 6.0 5.0 - 8.0   Protein, UA     Urobilinogen, UA     Nitrite, UA     Leukocytes, UA Moderate (2+) (A) Negative   Appearance     Odor     Medical History Jaylanni has a past medical history of Diverticulitis, GERD (gastroesophageal reflux disease), and PONV (postoperative nausea and vomiting).   Outpatient Encounter Medications as of 09/20/2020  Medication Sig  . albuterol (VENTOLIN HFA) 108 (90 Base) MCG/ACT inhaler Inhale 2 puffs into the lungs every 6 (six) hours as needed for wheezing or shortness of breath.  . cyclobenzaprine (FLEXERIL) 10 MG tablet TAKE 1 TABLET BY MOUTH 3 TIMES A DAY AS NEEDED FOR MUSCLE SPASMS  . Famotidine (PEPCID PO) Take by mouth.  . fluconazole (DIFLUCAN) 150 MG tablet Take 1 tablet (150 mg total) by mouth once for 1 dose.  . Multiple Vitamins-Minerals (EQ MULTIVITAMINS ADULT GUMMY PO) Take by mouth 2 (two) times daily.  . Simethicone (GAS-X PO) Take by mouth as needed.  . nitrofurantoin,  macrocrystal-monohydrate, (MACROBID) 100 MG capsule Take 1 capsule (100 mg total) by mouth 2 (two) times daily.  . [DISCONTINUED] azithromycin (ZITHROMAX Z-PAK) 250 MG tablet Take 2 tablets (500 mg) on  Day 1,  followed by 1 tablet (250 mg) once daily on Days 2 through 5.  . [DISCONTINUED] buPROPion (WELLBUTRIN SR) 150 MG 12 hr tablet TAKE 1 BY MOUTH EVERY MORNING FOR 3 DAYS, THEN 1 TABLET TWICE A DAY (Patient not taking: No sig reported)  . [DISCONTINUED] famotidine (PEPCID) 20 MG tablet One after supper (Patient not taking: Reported on 07/29/2019)  . [DISCONTINUED] pantoprazole (PROTONIX) 40 MG tablet Take 30-60 min before first meal of the day (Patient not taking: Reported on 07/29/2019)  . [DISCONTINUED] silver sulfADIAZINE (SILVADENE) 1 % cream Apply 1 application topically daily.   No facility-administered encounter medications on file as of 09/20/2020.     Review of Systems  Constitutional: Negative for chills and fever.       Has not checked temp.   HENT: Negative for congestion, ear pain and sore throat.   Respiratory: Positive for chest tightness. Negative for cough and shortness of breath.        Chest tightness from COPD.   Cardiovascular: Negative for chest pain, palpitations and leg  swelling.  Gastrointestinal: Positive for abdominal pain and diarrhea. Negative for nausea and vomiting.       Diarrhea not new.   Genitourinary: Positive for dysuria and frequency. Negative for decreased urine volume, hematuria and urgency.  Musculoskeletal: Negative for back pain.     Vitals BP 118/76   Pulse (!) 108   Temp 97.8 F (36.6 C)   Ht 5\' 7"  (1.702 m)   Wt 213 lb (96.6 kg)   SpO2 100%   BMI 33.36 kg/m   Objective:   Physical Exam Vitals reviewed.  Constitutional:      Appearance: Normal appearance.  Cardiovascular:     Rate and Rhythm: Normal rate and regular rhythm.     Heart sounds: Normal heart sounds.  Pulmonary:     Effort: Pulmonary effort is normal.     Breath  sounds: Normal breath sounds.  Abdominal:     Tenderness: There is abdominal tenderness. There is no right CVA tenderness or left CVA tenderness.  Skin:    General: Skin is warm and dry.  Neurological:     General: No focal deficit present.     Mental Status: She is alert.  Psychiatric:        Behavior: Behavior normal.     Results for orders placed or performed in visit on 09/20/20  POCT urinalysis dipstick  Result Value Ref Range   Color, UA     Clarity, UA     Glucose, UA     Bilirubin, UA     Ketones, UA     Spec Grav, UA <=1.005 (A) 1.010 - 1.025   Blood, UA     pH, UA 6.0 5.0 - 8.0   Protein, UA     Urobilinogen, UA     Nitrite, UA     Leukocytes, UA Moderate (2+) (A) Negative   Appearance     Odor      Assessment and Plan   1. Dysuria - POCT urinalysis dipstick  2. Acute cystitis without hematuria - nitrofurantoin, macrocrystal-monohydrate, (MACROBID) 100 MG capsule; Take 1 capsule (100 mg total) by mouth 2 (two) times daily.  Dispense: 20 capsule; Refill: 0  3. Antibiotic-induced yeast infection - fluconazole (DIFLUCAN) 150 MG tablet; Take 1 tablet (150 mg total) by mouth once for 1 dose.  Dispense: 1 tablet; Refill: 0   Moderate leukocytes noted in urine.  Will treat for acute cystitis with antibiotic for 10 days.  She has a tendency to get yeast infections with antibiotic therapy, Diflucan ordered.  Agrees with plan of care discussed today. Understands warning signs to seek further care: chest pain, shortness of breath, any significant change in health.  Understands to follow-up if symptoms do not improve, or worsen.  Will send urine for culture, notify if antibiotic not appropriate for bacteria.  She is due for her annual wellness, she will schedule this on the way out, she will get routine labs 1 week prior to this visit.  09/22/20, NP 09/20/2020

## 2020-09-20 NOTE — Telephone Encounter (Signed)
Patient has physical in April and needing labs done. 

## 2020-09-20 NOTE — Patient Instructions (Signed)
Urinary Tract Infection, Adult A urinary tract infection (UTI) is an infection of any part of the urinary tract. The urinary tract includes:  The kidneys.  The ureters.  The bladder.  The urethra. These organs make, store, and get rid of pee (urine) in the body. What are the causes? This infection is caused by germs (bacteria) in your genital area. These germs grow and cause swelling (inflammation) of your urinary tract. What increases the risk? The following factors may make you more likely to develop this condition:  Using a small, thin tube (catheter) to drain pee.  Not being able to control when you pee or poop (incontinence).  Being female. If you are female, these things can increase the risk: ? Using these methods to prevent pregnancy:  A medicine that kills sperm (spermicide).  A device that blocks sperm (diaphragm). ? Having low levels of a female hormone (estrogen). ? Being pregnant. You are more likely to develop this condition if:  You have genes that add to your risk.  You are sexually active.  You take antibiotic medicines.  You have trouble peeing because of: ? A prostate that is bigger than normal, if you are female. ? A blockage in the part of your body that drains pee from the bladder. ? A kidney stone. ? A nerve condition that affects your bladder. ? Not getting enough to drink. ? Not peeing often enough.  You have other conditions, such as: ? Diabetes. ? A weak disease-fighting system (immune system). ? Sickle cell disease. ? Gout. ? Injury of the spine. What are the signs or symptoms? Symptoms of this condition include:  Needing to pee right away.  Peeing small amounts often.  Pain or burning when peeing.  Blood in the pee.  Pee that smells bad or not like normal.  Trouble peeing.  Pee that is cloudy.  Fluid coming from the vagina, if you are female.  Pain in the belly or lower back. Other symptoms include:  Vomiting.  Not  feeling hungry.  Feeling mixed up (confused). This may be the first symptom in older adults.  Being tired and grouchy (irritable).  A fever.  Watery poop (diarrhea). How is this treated?  Taking antibiotic medicine.  Taking other medicines.  Drinking enough water. In some cases, you may need to see a specialist. Follow these instructions at home: Medicines  Take over-the-counter and prescription medicines only as told by your doctor.  If you were prescribed an antibiotic medicine, take it as told by your doctor. Do not stop taking it even if you start to feel better. General instructions  Make sure you: ? Pee until your bladder is empty. ? Do not hold pee for a long time. ? Empty your bladder after sex. ? Wipe from front to back after peeing or pooping if you are a female. Use each tissue one time when you wipe.  Drink enough fluid to keep your pee pale yellow.  Keep all follow-up visits.   Contact a doctor if:  You do not get better after 1-2 days.  Your symptoms go away and then come back. Get help right away if:  You have very bad back pain.  You have very bad pain in your lower belly.  You have a fever.  You have chills.  You feeling like you will vomit or you vomit. Summary  A urinary tract infection (UTI) is an infection of any part of the urinary tract.  This condition is caused by   germs in your genital area.  There are many risk factors for a UTI.  Treatment includes antibiotic medicines.  Drink enough fluid to keep your pee pale yellow. This information is not intended to replace advice given to you by your health care provider. Make sure you discuss any questions you have with your health care provider. Document Revised: 03/23/2020 Document Reviewed: 03/23/2020 Elsevier Patient Education  2021 [employer]  

## 2020-09-20 NOTE — Telephone Encounter (Signed)
Orders were put in today at her visit

## 2020-09-20 NOTE — Addendum Note (Signed)
Addended by: Metro Kung on: 09/20/2020 01:31 PM   Modules accepted: Orders

## 2020-09-26 LAB — URINE CULTURE

## 2020-09-26 LAB — SPECIMEN STATUS REPORT

## 2020-10-31 ENCOUNTER — Encounter (HOSPITAL_COMMUNITY): Payer: Self-pay

## 2020-10-31 ENCOUNTER — Other Ambulatory Visit: Payer: Self-pay

## 2020-10-31 ENCOUNTER — Emergency Department (HOSPITAL_COMMUNITY): Payer: 59

## 2020-10-31 ENCOUNTER — Emergency Department (HOSPITAL_COMMUNITY)
Admission: EM | Admit: 2020-10-31 | Discharge: 2020-10-31 | Disposition: A | Discharge status: Home / Self Care | Payer: 59 | Attending: Emergency Medicine | Admitting: Emergency Medicine

## 2020-10-31 ENCOUNTER — Ambulatory Visit
Admission: EM | Admit: 2020-10-31 | Discharge: 2020-10-31 | Disposition: A | Discharge status: Home / Self Care | Payer: Self-pay

## 2020-10-31 DIAGNOSIS — J449 Chronic obstructive pulmonary disease, unspecified: Secondary | ICD-10-CM | POA: Diagnosis not present

## 2020-10-31 DIAGNOSIS — F17210 Nicotine dependence, cigarettes, uncomplicated: Secondary | ICD-10-CM | POA: Diagnosis not present

## 2020-10-31 DIAGNOSIS — G930 Cerebral cysts: Secondary | ICD-10-CM | POA: Insufficient documentation

## 2020-10-31 DIAGNOSIS — J45909 Unspecified asthma, uncomplicated: Secondary | ICD-10-CM | POA: Diagnosis not present

## 2020-10-31 DIAGNOSIS — R42 Dizziness and giddiness: Secondary | ICD-10-CM | POA: Diagnosis present

## 2020-10-31 HISTORY — DX: Chronic obstructive pulmonary disease, unspecified: J44.9

## 2020-10-31 LAB — CBC WITH DIFFERENTIAL/PLATELET
Abs Immature Granulocytes: 0.03 10*3/uL (ref 0.00–0.07)
Basophils Absolute: 0 10*3/uL (ref 0.0–0.1)
Basophils Relative: 0 %
Eosinophils Absolute: 0.2 10*3/uL (ref 0.0–0.5)
Eosinophils Relative: 3 %
HCT: 43.2 % (ref 36.0–46.0)
Hemoglobin: 14.3 g/dL (ref 12.0–15.0)
Immature Granulocytes: 0 %
Lymphocytes Relative: 35 %
Lymphs Abs: 2.7 10*3/uL (ref 0.7–4.0)
MCH: 31.6 pg (ref 26.0–34.0)
MCHC: 33.1 g/dL (ref 30.0–36.0)
MCV: 95.4 fL (ref 80.0–100.0)
Monocytes Absolute: 0.6 10*3/uL (ref 0.1–1.0)
Monocytes Relative: 8 %
Neutro Abs: 4.3 10*3/uL (ref 1.7–7.7)
Neutrophils Relative %: 54 %
Platelets: 173 10*3/uL (ref 150–400)
RBC: 4.53 MIL/uL (ref 3.87–5.11)
RDW: 12.6 % (ref 11.5–15.5)
WBC: 7.9 10*3/uL (ref 4.0–10.5)
nRBC: 0 % (ref 0.0–0.2)

## 2020-10-31 LAB — BASIC METABOLIC PANEL
Anion gap: 10 (ref 5–15)
BUN: 8 mg/dL (ref 6–20)
CO2: 24 mmol/L (ref 22–32)
Calcium: 9.3 mg/dL (ref 8.9–10.3)
Chloride: 107 mmol/L (ref 98–111)
Creatinine, Ser: 0.87 mg/dL (ref 0.44–1.00)
GFR, Estimated: >60 mL/min (ref >60)
Glucose, Bld: 77 mg/dL (ref 70–99)
Potassium: 3.8 mmol/L (ref 3.5–5.1)
Sodium: 141 mmol/L (ref 135–145)

## 2020-10-31 MED ORDER — DIAZEPAM 5 MG PO TABS: 5.0000 mg | ORAL_TABLET | Freq: Two times a day (BID) | ORAL | 0 refills | Status: DC

## 2020-10-31 MED ORDER — MECLIZINE HCL 25 MG PO TABS: 25.0000 mg | ORAL_TABLET | Freq: Three times a day (TID) | ORAL | 0 refills | Status: DC | PRN

## 2020-10-31 MED ORDER — DIAZEPAM 5 MG PO TABS
5.0000 mg | ORAL_TABLET | Freq: Once | ORAL | Status: DC
  Filled 2020-10-31: qty 1

## 2020-10-31 MED ORDER — MECLIZINE HCL 12.5 MG PO TABS
25.0000 mg | ORAL_TABLET | Freq: Once | ORAL | Status: AC
  Administered 2020-10-31: 25 mg via ORAL
  Filled 2020-10-31: qty 2

## 2020-10-31 NOTE — ED Provider Notes (Signed)
[facility] EMERGENCY DEPARTMENT Provider Note   CSN: [id number] Arrival date & time: 10/31/20  [id number]     History Chief Complaint  Patient presents with  . Dizziness    Brandy Hensley is a 50 y.o. female.  50 year old female with history of COPD, GERD presents with complaint of dizziness.  Patient states that she first noticed she was feeling dizzy Monday morning, states that she woke up laying on her side in bed feeling fine however when she rolled over she became suddenly dizzy described as room spinning.  Patient felt dizzy and unsteady that morning and then symptoms seem to improve, reports feeling "swimmy headed" off and on throughout the day Monday and Tuesday.  Patient states last night she woke up with worsening of her dizziness which prompted her to come to the emergency room at this time.  Patient states that this time she feels "swimmy headed," with a pressure sensation to the back of her head.  Symptoms are worse with changes in position and movement.  Denies unilateral weakness or numbness.  No history of same previously, denies recent immunizations or illness.  No other complaints or concerns.        Past Medical History:  Diagnosis Date  . COPD (chronic obstructive pulmonary disease) (HCC)   . Diverticulitis   . GERD (gastroesophageal reflux disease)   . PONV (postoperative nausea and vomiting)     Patient Active Problem List   Diagnosis Date Noted  . Acute cystitis without hematuria 09/20/2020  . Dysuria 09/20/2020  . Antibiotic-induced yeast infection 09/20/2020  . DOE (dyspnea on exertion) 12/22/2018  . COPD GOLD II (barely)  but MZ/ smoker 12/22/2018  . Cigarette smoker 12/22/2018  . Hypokalemia due to excessive gastrointestinal loss of potassium 12/22/2018  . Perimenopause 02/26/2017  . Irregular menses 02/26/2017  . Reactive airway disease 02/26/2017  . Irritable bowel syndrome with diarrhea 01/15/2017  . Hyperlipidemia 07/05/2015  . GERD  (gastroesophageal reflux disease) 10/31/2013  . Muscle spasms of neck 11/29/2012  . Morbid obesity (HCC) 11/29/2012  . Orthostatic hypotension 11/29/2012    Past Surgical History:  Procedure Laterality Date  . CHOLECYSTECTOMY  09/10/2012   Procedure: LAPAROSCOPIC CHOLECYSTECTOMY;  Surgeon: Dalia HeadingMark A Jenkins, MD;  Location: [facility];  Service: General;  Laterality: N/A;  . COLONOSCOPY  2005-2006   Dr.Rehman @[facility]   . TUBAL LIGATION       OB History   No obstetric history on file.     Family History  Problem Relation Age of Onset  . Hypertension Mother   . Osteoporosis Mother   . COPD Mother        smoked  . Heart attack Maternal Grandmother   . Cancer Maternal Grandmother [id number]       pancreatic cancer  . Heart disease Maternal Grandmother 76       MI/CABG  . Emphysema Father        smoked  . Lung cancer Father     Social History   Tobacco Use  . Smoking status: Current Every Day Smoker    Packs/day: 1.00    Years: 31.00    Pack years: 31.00    Types: Cigarettes  . Smokeless tobacco: Never Used  Vaping Use  . Vaping Use: Never used  Substance Use Topics  . Alcohol use: No    Alcohol/week: 0.0 standard drinks  . Drug use: No    Home Medications Prior to Admission medications   Medication Sig Start Date End Date Taking? Authorizing  Provider  diazepam (VALIUM) 5 MG tablet Take 1 tablet (5 mg total) by mouth 2 (two) times daily. 10/31/20  Yes Jeannie Fend, PA-C  Famotidine (PEPCID PO) Take 10 mg by mouth daily as needed (Acid after eating dinner).   Yes [provider]  albuterol (VENTOLIN HFA) 108 (90 Base) MCG/ACT inhaler Inhale 2 puffs into the lungs every 6 (six) hours as needed for wheezing or shortness of breath. Patient not taking: Reported on 10/31/2020 07/29/19   Babs Sciara, MD  cyclobenzaprine (FLEXERIL) 10 MG tablet TAKE 1 TABLET BY MOUTH 3 TIMES A DAY AS NEEDED FOR MUSCLE SPASMS Patient not taking: Reported on 10/31/2020 07/20/18   Merlyn Albert,  MD  nitrofurantoin, macrocrystal-monohydrate, (MACROBID) 100 MG capsule Take 1 capsule (100 mg total) by mouth 2 (two) times daily. Patient not taking: Reported on 10/31/2020 09/20/20   Novella Olive, NP  buPROPion [facility] SR) 150 MG 12 hr tablet TAKE 1 BY MOUTH EVERY MORNING FOR 3 DAYS, THEN 1 TABLET TWICE A DAY Patient not taking: No sig reported 03/22/19 10/16/19  Merlyn Albert, MD  pantoprazole (PROTONIX) 40 MG tablet Take 30-60 min before first meal of the day Patient not taking: Reported on 07/29/2019 02/15/19 10/16/19  Nyoka Cowden, MD    Allergies    Codeine, Flovent diskus [fluticasone propionate (inhal)], Augmentin [amoxicillin-pot clavulanate], Contrave [naltrexone-bupropion hcl er], and Qsymia [phentermine-topiramate]  Review of Systems   Review of Systems  Constitutional: Negative for chills, diaphoresis and fever.  HENT: Negative for congestion.   Eyes: Negative for visual disturbance.  Respiratory: Negative for cough and shortness of breath.   Cardiovascular: Negative for chest pain.  Gastrointestinal: Positive for nausea. Negative for abdominal pain and vomiting.  Genitourinary: Negative for dysuria.  Musculoskeletal: Negative for neck pain and neck stiffness.  Skin: Negative for rash and wound.  Allergic/Immunologic: Negative for immunocompromised state.  Neurological: Positive for dizziness. Negative for facial asymmetry, speech difficulty, weakness and numbness.  Hematological: Does not bruise/bleed easily.  All other systems reviewed and are negative.   Physical Exam Updated Vital Signs BP 121/80   Pulse 72   Temp 98.4 F (36.9 C) (Oral)   Resp 14   Ht 5\' 7"  (1.702 m)   Wt 95.3 kg   SpO2 97%   BMI 32.89 kg/m   Physical Exam Vitals and nursing note reviewed.  Constitutional:      General: She is not in acute distress.    Appearance: She is well-developed and well-nourished. She is not diaphoretic.  HENT:     Head: Normocephalic and atraumatic.      Nose: Nose normal.     Mouth/Throat:     Mouth: Mucous membranes are moist.  Eyes:     Extraocular Movements: Extraocular movements intact.     Pupils: Pupils are equal, round, and reactive to light.  Cardiovascular:     Rate and Rhythm: Normal rate and regular rhythm.     Pulses: Normal pulses.     Heart sounds: Normal heart sounds.  Pulmonary:     Effort: Pulmonary effort is normal.     Breath sounds: Normal breath sounds.  Abdominal:     Palpations: Abdomen is soft.     Tenderness: There is no abdominal tenderness.  Musculoskeletal:     Right lower leg: No edema.     Left lower leg: No edema.  Skin:    General: Skin is warm and dry.     Findings: No erythema or  rash.  Neurological:     General: No focal deficit present.     Mental Status: She is alert and oriented to person, place, and time.     Cranial Nerves: No cranial nerve deficit.     Sensory: No sensory deficit.     Motor: No weakness.     Coordination: Coordination normal.     Comments: Symptoms clearly reproduced with quick transition from supine to sitting upright in bed.  Psychiatric:        Mood and Affect: Mood and affect normal.        Behavior: Behavior normal.     ED Results / Procedures / Treatments   Labs (all labs ordered are listed, but only abnormal results are displayed) Labs Reviewed  BASIC METABOLIC PANEL  CBC WITH DIFFERENTIAL/PLATELET  URINALYSIS, ROUTINE W REFLEX MICROSCOPIC    EKG EKG Interpretation  Date/Time:  Wednesday October 31 2020 09:55:10 EST Ventricular Rate:  89 PR Interval:    QRS Duration: 100 QT Interval:  378 QTC Calculation: 460 R Axis:   76 Text Interpretation: Sinus rhythm Low voltage, precordial leads No previous ECGs available Confirmed by Vanetta Mulders [phone]) on 10/31/2020 10:03:51 AM   Radiology CT Head Wo Contrast  Result Date: 10/31/2020 CLINICAL DATA:  Dizziness. EXAM: CT HEAD WITHOUT CONTRAST TECHNIQUE: Contiguous axial images were obtained from the  base of the skull through the vertex without intravenous contrast. COMPARISON:  None. FINDINGS: Brain: No evidence of acute large vascular territory infarction, hemorrhage, hydrocephalus, or extra-axial fluid collection. There is a 5 mm round hyperdense lesion in the region of the foramen of Monro (series 2, image 14 and series 4, image 30), compatible with a colloid cyst. Vascular: No hyperdense vessel identified. Skull: No acute fracture. Sinuses/Orbits: Mild ethmoid air cell mucosal thickening. Otherwise, visualized sinuses are clear. Unremarkable orbits. Other: No mastoid effusions. 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 No evidence of hydrocephalus at this time, but given the risk for developing hydrocephalus recommend neurosurgical consultation for management/follow-up. Findings and recommendations discussed with Dr. Deretha Emory via telephone at 11:10 PM. Electronically Signed   By: Feliberto Harts MD   On: 10/31/2020 11:16    Procedures Procedures   Medications Ordered in ED Medications  diazepam (VALIUM) tablet 5 mg (has no administration in time range)  meclizine (ANTIVERT) tablet 25 mg (25 mg Oral Given 10/31/20 1033)    ED Course  I have reviewed the triage vital signs and the nursing notes.  Pertinent labs & imaging results that were available during my care of the patient were reviewed by me and considered in my medical decision making (see chart for details).  Clinical Course as of 10/31/20 1243  Wed Oct 31, 2020  6760 50 year old female with complaint of dizziness, occurs with changes in position. On exam, patient is well appearing, normal arm/leg strength, sensation symmetric, CN exam normal. Symptoms reproduced with lying head of bed down/back up. No prior head imaging, head CT ordered although symptoms appear consisted with vertigo. CBC and BMP unremarkable, vitals stable.  EKG without significant findings. 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 Awaiting neuro surgery consult for dispo planning.  [LM]  1242 Discussed with Dr. Maisie Fus with neurosurgery who as reviewed patient's imaging, does not feel that this incidental finding on her head CT is related to her vertigo/dizziness today.  Dr. Maisie Fus recommends outpatient MRI brain and  then follow-up with neurosurgery. Discussed results and plan of care with patient and her spouse at bedside, offered to complete MRI today while in the emergency room, patient prefers to follow-up with her PCP and do this outpatient when she is feeling better. Plan is to give Valium for her persistent dizziness and feeling like her muscles are tight in her neck.  Patient to follow-up with neurology for her vertigo, PCP to further discuss outpatient MRI brain and by neurosurgery with her results.  Advised return to the ED at anytime for worsening or concerning symptoms. [LM]    Clinical Course User Index [LM] Alden Hipp   MDM Rules/Calculators/A&P                          Final Clinical Impression(s) / ED Diagnoses Final diagnoses:  Vertigo  Brain cyst    Rx / DC Orders ED Discharge Orders         Ordered    diazepam (VALIUM) 5 MG tablet  2 times daily        10/31/20 1234           Jeannie Fend, PA-C 10/31/20 1244    Vanetta Mulders, MD 11/13/20 [phone]

## 2020-10-31 NOTE — ED Triage Notes (Signed)
Pt having dizziness, very unsteady walking that started last night.  No prior hx of vertigo dx.

## 2020-10-31 NOTE — ED Triage Notes (Signed)
Pt presents to ED with complaints of dizziness, started yesterday morning at 0700. LKW 3/7 @ 2230

## 2020-10-31 NOTE — ED Notes (Signed)
Patient is being discharged from the Urgent Care and sent to the Emergency Department via pov . Per B.Wurst, patient is in need of higher level of care due to dizziness. Patient is aware and verbalizes understanding of plan of care. There were no vitals filed for this visit.

## 2020-10-31 NOTE — Discharge Instructions (Signed)
Take Valium as prescribed for dizziness.  -Return to the ER for worsening or concerning symptoms. -Call your doctor to schedule follow up for MRI brain for evaluation of incidental brain cyst on CT today, as recommended by neurosurgery.  -Follow up with neurosurgery after your MRI -Follow up with neurology for dizziness/vertigo, call today to schedule an appointment.

## 2020-11-01 ENCOUNTER — Telehealth: Payer: Self-pay

## 2020-11-01 NOTE — Telephone Encounter (Signed)
Transition Care Management Unsuccessful Follow-up Telephone Call  Date of discharge and from where:  10/30/2020 from [facility]  Attempts:  1st Attempt  Reason for unsuccessful TCM follow-up call:  Left voice message     

## 2020-11-02 NOTE — Telephone Encounter (Signed)
Transition Care Management Unsuccessful Follow-up Telephone Call  Date of discharge and from where:  10/30/2020 from [facility]  Attempts:  2nd Attempt  Reason for unsuccessful TCM follow-up call:  Left voice message

## 2020-11-05 NOTE — Telephone Encounter (Signed)
Transition Care Management Unsuccessful Follow-up Telephone Call  Date of discharge and from where:  10/31/2020 from [facility]  Attempts:  3rd Attempt  Reason for unsuccessful TCM follow-up call:  Left voice message

## 2020-11-09 ENCOUNTER — Encounter: Payer: Self-pay | Admitting: Family Medicine

## 2020-11-09 ENCOUNTER — Ambulatory Visit (INDEPENDENT_AMBULATORY_CARE_PROVIDER_SITE_OTHER): Payer: 59 | Admitting: Family Medicine

## 2020-11-09 ENCOUNTER — Other Ambulatory Visit: Payer: Self-pay

## 2020-11-09 VITALS — BP 118/76 | HR 91 | Temp 97.1°F | Ht 67.0 in | Wt 212.0 lb

## 2020-11-09 DIAGNOSIS — H8111 Benign paroxysmal vertigo, right ear: Secondary | ICD-10-CM | POA: Diagnosis not present

## 2020-11-09 MED ORDER — MECLIZINE HCL 25 MG PO TABS: 25.0000 mg | ORAL_TABLET | Freq: Three times a day (TID) | ORAL | 0 refills | Status: AC | PRN

## 2020-11-09 MED ORDER — PREDNISONE 10 MG PO TABS: 30.0000 mg | ORAL_TABLET | Freq: Every day | ORAL | 0 refills | Status: DC

## 2020-11-09 NOTE — Progress Notes (Signed)
Patient ID: Brandy Hensley, female    DOB: 12/29/1970, 50 y.o.   MRN: [id number]   Chief Complaint  Patient presents with  . Dizziness   Subjective:    HPI  Cc- dizziness  Started about 10 days ago. Had some dizziness while helping someone with moving. Felt waves of dizziness that day. That night had a bad spell of dizziness was having to grab blankets and spinning feeling. ED follow up for dizziness. States dizziness is better but still having it. Meclizine is helping. Did not pickup diazepam that was prescribed.   Slightly improved from first few days.  If moving head slightly then has dizziness when laying on side or looking down. Comes and goes every few hours. Was taking it scheduled for few days.  No meds today.  Feeling okay, but if looking to fast or down feeling it.  Ct head done when in ED- showing 71mm colloid cyst in brain. -incidental finding. No sign of hydrocephalus.  H/o migraines.   gerd-Only taking pepcid daily.  Does take flexeril but not been on it in months.  Medical History Brandy Hensley has a past medical history of COPD (chronic obstructive pulmonary disease) (HCC), Diverticulitis, GERD (gastroesophageal reflux disease), and PONV (postoperative nausea and vomiting).   Outpatient Encounter Medications as of 11/09/2020  Medication Sig  . albuterol (VENTOLIN HFA) 108 (90 Base) MCG/ACT inhaler Inhale 2 puffs into the lungs every 6 (six) hours as needed for wheezing or shortness of breath.  . cyclobenzaprine (FLEXERIL) 10 MG tablet TAKE 1 TABLET BY MOUTH 3 TIMES A DAY AS NEEDED FOR MUSCLE SPASMS  . Famotidine (PEPCID PO) Take 10 mg by mouth daily as needed (Acid after eating dinner).  . meclizine (ANTIVERT) 25 MG tablet Take 1 tablet (25 mg total) by mouth 3 (three) times daily as needed for dizziness.  . predniSONE (DELTASONE) 10 MG tablet Take 3 tablets (30 mg total) by mouth daily with breakfast.  . [DISCONTINUED] meclizine (ANTIVERT) 25 MG tablet Take 1 tablet  (25 mg total) by mouth 3 (three) times daily as needed for dizziness.  . [DISCONTINUED] buPROPion (WELLBUTRIN SR) 150 MG 12 hr tablet TAKE 1 BY MOUTH EVERY MORNING FOR 3 DAYS, THEN 1 TABLET TWICE A DAY (Patient not taking: No sig reported)  . [DISCONTINUED] diazepam (VALIUM) 5 MG tablet Take 1 tablet (5 mg total) by mouth 2 (two) times daily.  . [DISCONTINUED] nitrofurantoin, macrocrystal-monohydrate, (MACROBID) 100 MG capsule Take 1 capsule (100 mg total) by mouth 2 (two) times daily. (Patient not taking: Reported on 10/31/2020)  . [DISCONTINUED] pantoprazole (PROTONIX) 40 MG tablet Take 30-60 min before first meal of the day (Patient not taking: No sig reported)   No facility-administered encounter medications on file as of 11/09/2020.     Review of Systems  Constitutional: Negative for chills and fever.  HENT: Negative for congestion, rhinorrhea and sore throat.   Respiratory: Negative for cough, shortness of breath and wheezing.   Cardiovascular: Negative for chest pain and leg swelling.  Gastrointestinal: Negative for abdominal pain, diarrhea, nausea and vomiting.  Genitourinary: Negative for dysuria and frequency.  Musculoskeletal: Negative for arthralgias and back pain.  Skin: Negative for rash.  Neurological: Positive for dizziness (improving). Negative for seizures, syncope, weakness, numbness and headaches.     Vitals BP 118/76   Pulse 91   Temp (!) 97.1 F (36.2 C)   Ht 5\' 7"  (1.702 m)   Wt 212 lb (96.2 kg)   SpO2 96%   BMI  33.20 kg/m   Objective:   Physical Exam Vitals and nursing note reviewed.  Constitutional:      General: She is not in acute distress.    Appearance: Normal appearance. She is not ill-appearing.  HENT:     Head: Normocephalic and atraumatic.     Right Ear: Tympanic membrane, ear canal and external ear normal.     Left Ear: Tympanic membrane, ear canal and external ear normal.     Nose: Nose normal.     Mouth/Throat:     Mouth: Mucous  membranes are moist.     Pharynx: Oropharynx is clear.  Eyes:     Extraocular Movements: Extraocular movements intact.     Conjunctiva/sclera: Conjunctivae normal.     Pupils: Pupils are equal, round, and reactive to light.  Cardiovascular:     Rate and Rhythm: Normal rate and regular rhythm.     Pulses: Normal pulses.     Heart sounds: Normal heart sounds.  Pulmonary:     Effort: Pulmonary effort is normal.     Breath sounds: Normal breath sounds. No wheezing, rhonchi or rales.  Musculoskeletal:        General: Normal range of motion.     Right lower leg: No edema.     Left lower leg: No edema.  Skin:    General: Skin is warm and dry.     Findings: No lesion or rash.  Neurological:     General: No focal deficit present.     Mental Status: She is alert and oriented to person, place, and time.     Cranial Nerves: No cranial nerve deficit.     Sensory: No sensory deficit.  Psychiatric:        Mood and Affect: Mood normal.        Behavior: Behavior normal.      Assessment and Plan   1. Benign paroxysmal positional vertigo of right ear - predniSONE (DELTASONE) 10 MG tablet; Take 3 tablets (30 mg total) by mouth daily with breakfast.  Dispense: 15 tablet; Refill: 0 - meclizine (ANTIVERT) 25 MG tablet; Take 1 tablet (25 mg total) by mouth 3 (three) times daily as needed for dizziness.  Dispense: 30 tablet; Refill: 0   Pt to call if not improving or call back.  May need f/u with ENT or PT.   Return if symptoms worsen or fail to improve.

## 2020-11-23 LAB — CBC WITH DIFFERENTIAL/PLATELET
Basophils Absolute: 0 10*3/uL (ref 0.0–0.2)
Basos: 0 %
EOS (ABSOLUTE): 0.3 10*3/uL (ref 0.0–0.4)
Eos: 3 %
Hematocrit: 43.4 % (ref 34.0–46.6)
Hemoglobin: 14.7 g/dL (ref 11.1–15.9)
Immature Grans (Abs): 0 10*3/uL (ref 0.0–0.1)
Immature Granulocytes: 0 %
Lymphocytes Absolute: 2.8 10*3/uL (ref 0.7–3.1)
Lymphs: 31 %
MCH: 31.5 pg (ref 26.6–33.0)
MCHC: 33.9 g/dL (ref 31.5–35.7)
MCV: 93 fL (ref 79–97)
Monocytes Absolute: 0.7 10*3/uL (ref 0.1–0.9)
Monocytes: 8 %
Neutrophils Absolute: 5.3 10*3/uL (ref 1.4–7.0)
Neutrophils: 58 %
Platelets: 204 10*3/uL (ref 150–450)
RBC: 4.67 x10E6/uL (ref 3.77–5.28)
RDW: 12.7 % (ref 11.7–15.4)
WBC: 9.2 10*3/uL (ref 3.4–10.8)

## 2020-11-23 LAB — COMPREHENSIVE METABOLIC PANEL
ALT: 16 IU/L (ref 0–32)
AST: 22 IU/L (ref 0–40)
Albumin/Globulin Ratio: 1.7 (ref 1.2–2.2)
Albumin: 4.2 g/dL (ref 3.8–4.8)
Alkaline Phosphatase: 100 IU/L (ref 44–121)
BUN/Creatinine Ratio: 5 — ABNORMAL LOW (ref 9–23)
BUN: 5 mg/dL — ABNORMAL LOW (ref 6–24)
Bilirubin Total: 0.8 mg/dL (ref 0.0–1.2)
CO2: 21 mmol/L (ref 20–29)
Calcium: 9.3 mg/dL (ref 8.7–10.2)
Chloride: 104 mmol/L (ref 96–106)
Creatinine, Ser: 0.99 mg/dL (ref 0.57–1.00)
Globulin, Total: 2.5 g/dL (ref 1.5–4.5)
Glucose: 89 mg/dL (ref 65–99)
Potassium: 4 mmol/L (ref 3.5–5.2)
Sodium: 141 mmol/L (ref 134–144)
Total Protein: 6.7 g/dL (ref 6.0–8.5)
eGFR: 70 mL/min/{1.73_m2} (ref >59)

## 2020-11-23 LAB — TSH: TSH: 1.25 u[IU]/mL (ref 0.450–4.500)

## 2020-11-23 LAB — LIPID PANEL
Chol/HDL Ratio: 5.4 ratio — ABNORMAL HIGH (ref 0.0–4.4)
Cholesterol, Total: 242 mg/dL — ABNORMAL HIGH (ref 100–199)
HDL: 45 mg/dL (ref >39)
LDL Chol Calc (NIH): 170 mg/dL — ABNORMAL HIGH (ref 0–99)
Triglycerides: 146 mg/dL (ref 0–149)
VLDL Cholesterol Cal: 27 mg/dL (ref 5–40)

## 2020-11-24 ENCOUNTER — Encounter: Payer: Self-pay | Admitting: Family Medicine

## 2020-11-28 ENCOUNTER — Ambulatory Visit (INDEPENDENT_AMBULATORY_CARE_PROVIDER_SITE_OTHER): Payer: 59 | Admitting: Family Medicine

## 2020-11-28 ENCOUNTER — Other Ambulatory Visit: Payer: Self-pay

## 2020-11-28 ENCOUNTER — Encounter: Payer: Self-pay | Admitting: Family Medicine

## 2020-11-28 VITALS — BP 114/79 | HR 97 | Temp 97.4°F | Ht 66.0 in | Wt 214.0 lb

## 2020-11-28 DIAGNOSIS — Z0000 Encounter for general adult medical examination without abnormal findings: Secondary | ICD-10-CM | POA: Diagnosis not present

## 2020-11-28 DIAGNOSIS — Z124 Encounter for screening for malignant neoplasm of cervix: Secondary | ICD-10-CM

## 2020-11-28 DIAGNOSIS — E7800 Pure hypercholesterolemia, unspecified: Secondary | ICD-10-CM

## 2020-11-28 DIAGNOSIS — Z1231 Encounter for screening mammogram for malignant neoplasm of breast: Secondary | ICD-10-CM

## 2020-11-28 MED ORDER — ATORVASTATIN CALCIUM 10 MG PO TABS: 10.0000 mg | ORAL_TABLET | Freq: Every day | ORAL | 1 refills | Status: DC

## 2020-11-28 NOTE — Patient Instructions (Addendum)
Preventive Care 84-49 Years Old, Female Preventive care refers to lifestyle choices and visits with your health care provider that can promote health and wellness. This includes:  A yearly physical exam. This is also called an annual wellness visit.  Regular dental and eye exams.  Immunizations.  Screening for certain conditions.  Healthy lifestyle choices, such as: ? Eating a healthy diet. ? Getting regular exercise. ? Not using drugs or products that contain nicotine and tobacco. ? Limiting alcohol use. What can I expect for my preventive care visit? Physical exam Your health care provider will check your:  Height and weight. These may be used to calculate your BMI (body mass index). BMI is a measurement that tells if you are at a healthy weight.  Heart rate and blood pressure.  Body temperature.  Skin for abnormal spots. Counseling Your health care provider may ask you questions about your:  Past medical problems.  Family's medical history.  Alcohol, tobacco, and drug use.  Emotional well-being.  Home life and relationship well-being.  Sexual activity.  Diet, exercise, and sleep habits.  Work and work Statistician.  Access to firearms.  Method of birth control.  Menstrual cycle.  Pregnancy history. What immunizations do I need? Vaccines are usually given at various ages, according to a schedule. Your health care provider will recommend vaccines for you based on your age, medical history, and lifestyle or other factors, such as travel or where you work.   What tests do I need? Blood tests  Lipid and cholesterol levels. These may be checked every 5 years, or more often if you are over 49 years old.  Hepatitis C test.  Hepatitis B test. Screening  Lung cancer screening. You may have this screening every year starting at age 49 if you have a 30-pack-year history of smoking and currently smoke or have quit within the past 15 years.  Colorectal cancer  screening. ? All adults should have this screening starting at age 49 and continuing until age 49. ? Your health care provider may recommend screening at age 49 if you are at increased risk. ? You will have tests every 1-10 years, depending on your results and the type of screening test.  Diabetes screening. ? This is done by checking your blood sugar (glucose) after you have not eaten for a while (fasting). ? You may have this done every 1-3 years.  Mammogram. ? This may be done every 1-2 years. ? Talk with your health care provider about when you should start having regular mammograms. This may depend on whether you have a family history of breast cancer.  BRCA-related cancer screening. This may be done if you have a family history of breast, ovarian, tubal, or peritoneal cancers.  Pelvic exam and Pap test. ? This may be done every 3 years starting at age 10. ? Starting at age 49, this may be done every 5 years if you have a Pap test in combination with an HPV test. Other tests  STD (sexually transmitted disease) testing, if you are at risk.  Bone density scan. This is done to screen for osteoporosis. You may have this scan if you are at high risk for osteoporosis. Talk with your health care provider about your test results, treatment options, and if necessary, the need for more tests. Follow these instructions at home: Eating and drinking  Eat a diet that includes fresh fruits and vegetables, whole grains, lean protein, and low-fat dairy products.  Take vitamin and mineral supplements  as recommended by your health care provider.  Do not drink alcohol if: ? Your health care provider tells you not to drink. ? You are pregnant, may be pregnant, or are planning to become pregnant.  If you drink alcohol: ? Limit how much you have to 0-1 drink a day. ? Be aware of how much alcohol is in your drink. In the [place] one drink equals one 12 oz bottle of beer (355 mL), one 5 oz glass of  wine (148 mL), or one 1 oz glass of hard liquor (44 mL).   Lifestyle  Take daily care of your teeth and gums. Brush your teeth every morning and night with fluoride toothpaste. Floss one time each day.  Stay active. Exercise for at least 30 minutes 5 or more days each week.  Do not use any products that contain nicotine or tobacco, such as cigarettes, e-cigarettes, and chewing tobacco. If you need help quitting, ask your health care provider.  Do not use drugs.  If you are sexually active, practice safe sex. Use a condom or other form of protection to prevent STIs (sexually transmitted infections).  If you do not wish to become pregnant, use a form of birth control. If you plan to become pregnant, see your health care provider for a prepregnancy visit.  If told by your health care provider, take low-dose aspirin daily starting at age 49.  Find healthy ways to cope with stress, such as: ? Meditation, yoga, or listening to music. ? Journaling. ? Talking to a trusted person. ? Spending time with friends and family. Safety  Always wear your seat belt while driving or riding in a vehicle.  Do not drive: ? If you have been drinking alcohol. Do not ride with someone who has been drinking. ? When you are tired or distracted. ? While texting.  Wear a helmet and other protective equipment during sports activities.  If you have firearms in your house, make sure you follow all gun safety procedures. What's next?  Visit your health care provider once a year for an annual wellness visit.  Ask your health care provider how often you should have your eyes and teeth checked.  Stay up to date on all vaccines. This information is not intended to replace advice given to you by your health care provider. Make sure you discuss any questions you have with your health care provider. Document Revised: 05/15/2020 Document Reviewed: 04/22/2018 Elsevier Patient Education  2021 Else[employer]  Inc. Atorvastatin Tablets What is this medicine? ATORVASTATIN (a TORE va sta tin) is a statin. It lowers bad cholesterol and triglyceride levels in the blood. It also increases good cholesterol levels. It is used with lifestyle changes, like diet and exercise. It may be used alone or with other drugs. This medicine may be used for other purposes; ask your health care provider or pharmacist if you have questions. COMMON BRAND NAME(S): Lipitor What should I tell my health care provider before I take this medicine? They need to know if you have any of these conditions:  diabetes (high blood sugar)  if you often drink alcohol  kidney disease  liver disease  muscle cramps, pain  stroke  thyroid disease  an unusual or allergic reaction to atorvastatin, other medicines, foods, dyes, or preservatives  pregnant or trying to get pregnant  breast-feeding How should I use this medicine? Take this medicine by mouth. Take it as directed on the prescription label at the same time every day. You can take  it with or without food. If it upsets your stomach, take it with food. Keep taking it unless your health care provider tells you to stop. Do not take this medicine with grapefruit juice. Talk to your health care provider about the use of this medicine in children. While it may be prescribed for children as young as 10 for selected conditions, precautions do apply. Overdosage: If you think you have taken too much of this medicine contact a poison control center or emergency room at once. NOTE: This medicine is only for you. Do not share this medicine with others. What if I miss a dose? If you miss a dose, take it as soon as you can. If it is almost time for your next dose, take only that dose. Do not take double or extra doses. What may interact with this medicine? Do not take this medicine with any of the following medications:  dasabuvir; ombitasvir; paritaprevir; ritonavir  ombitasvir;  paritaprevir; ritonavir  posaconazole  red yeast rice This medicine may also interact with the following medications:  alcohol  birth control pills  certain antibiotics like erythromycin and clarithromycin  certain antivirals for HIV or hepatitis  certain medicines for cholesterol like fenofibrate, gemfibrozil, and niacin  certain medicines for fungal infections like ketoconazole and itraconazole  colchicine  cyclosporine  digoxin  grapefruit juice  rifampin This list may not describe all possible interactions. Give your health care provider a list of all the medicines, herbs, non-prescription drugs, or dietary supplements you use. Also tell them if you smoke, drink alcohol, or use illegal drugs. Some items may interact with your medicine. What should I watch for while using this medicine? Visit your health care provider for regular checks on your progress. Tell your health care provider if your symptoms do not start to get better or if they get worse. Your health care provider may tell you to stop taking this medicine if you develop muscle problems. If your muscle problems do not go away after stopping this medicine, contact your health care provider. Do not become pregnant while taking this medicine. Women should inform their health care provider if they wish to become pregnant or think they might be pregnant. There is potential for serious harm to an unborn child. Talk to your health care provider for more information. Do not breast-feed an infant while taking this medicine. Birth control may not work properly while you are taking this medicine. Talk to your health care provider about using an extra method of birth control. This medicine may increase blood sugar. Ask your health care provider if changes in diet or medicines are needed if you have diabetes. If you are going to need surgery or other procedure, tell your health care provider that you are using this  medicine. Taking this medicine is only part of a total heart healthy program. Your health care provider may give you a special diet to follow. Avoid alcohol. Avoid smoking. Ask your health care provider how much you should exercise. What side effects may I notice from receiving this medicine? Side effects that you should report to your doctor or health care provider as soon as possible:  allergic reactions (skin rash, itching or hives; swelling of the face, lips, or tongue)  high blood sugar (increased hunger, thirst or urination; unusually weak or tired, blurry vision)  infection (fever, chills, cough, sore throat, pain or trouble passing urine)  joint pain  liver injury (dark yellow or brown urine; general ill feeling or flu-like  symptoms; loss of appetite, right upper belly pain; unusually weak or tired, yellowing of the eyes or skin)  muscle injury (dark urine; trouble passing urine or change in the amount of urine; unusually weak or tired; muscle pain; back pain)  redness, blistering, peeling, or loosening of the skin, including inside the mouth Side effects that usually do not require medical attention (report to your doctor or health care provider if they continue or are bothersome):  diarrhea  nausea  upset stomach This list may not describe all possible side effects. Call your doctor for medical advice about side effects. You may report side effects to FDA at 1-800-FDA-1088. Where should I keep my medicine? Keep out of the reach of children and pets. Store at room temperature between 20 and 25 degrees C (68 and 77 degrees F). Get rid of any unused medicine after the expiration date. To get rid of medicines that are no longer needed or have expired:  Take the medicine to a medicine take-back program. Check with your pharmacy or law enforcement to find a location.  If you cannot return the medicine, check the label or package insert to see if the medicine should be thrown out  in the garbage or flushed down the toilet. If you are not sure, ask your health care provider. If it is safe to put it in the trash, take the medicine out of the container. Mix the medicine with cat litter, dirt, coffee grounds, or other unwanted substance. Seal the mixture in a bag or container. Put it in the trash. NOTE: This sheet is a summary. It may not cover all possible information. If you have questions about this medicine, talk to your doctor, pharmacist, or health care provider.  2021 Elsevier/Gold Standard (2020-07-26 12:41:57)

## 2020-11-28 NOTE — Progress Notes (Signed)
Patient ID: Brandy Hensley, female    DOB: Jul 09, 1971, 50 y.o.   MRN: [id number]   No chief complaint on file.  Subjective:  CC: wellness exam   Presents today for wellness exam with pap smear. Current smoker with plan to stop by September. Will discuss labs in detail at this visit. No concerns today.     The patient comes in today for a wellness visit.    A review of their health history was completed.  A review of medications was also completed.  Any needed refills; update meds  Eating habits: health conscious  Falls/  MVA accidents in past few months: none  Regular exercise: none  Specialist pt sees on regular basis: none  Preventative health issues were discussed.   Additional concerns: none   Medical History Brandy Hensley has a past medical history of COPD (chronic obstructive pulmonary disease) (HCC), Diverticulitis, GERD (gastroesophageal reflux disease), and PONV (postoperative nausea and vomiting).   Outpatient Encounter Medications as of 11/28/2020  Medication Sig  . albuterol (VENTOLIN HFA) 108 (90 Base) MCG/ACT inhaler Inhale 2 puffs into the lungs every 6 (six) hours as needed for wheezing or shortness of breath.  Marland Kitchen atorvastatin (LIPITOR) 10 MG tablet Take 1 tablet (10 mg total) by mouth daily.  . cyclobenzaprine (FLEXERIL) 10 MG tablet TAKE 1 TABLET BY MOUTH 3 TIMES A DAY AS NEEDED FOR MUSCLE SPASMS  . Famotidine (PEPCID PO) Take 10 mg by mouth daily as needed (Acid after eating dinner).  . meclizine (ANTIVERT) 25 MG tablet Take 1 tablet (25 mg total) by mouth 3 (three) times daily as needed for dizziness.  . [DISCONTINUED] buPROPion (WELLBUTRIN SR) 150 MG 12 hr tablet TAKE 1 BY MOUTH EVERY MORNING FOR 3 DAYS, THEN 1 TABLET TWICE A DAY (Patient not taking: No sig reported)  . [DISCONTINUED] pantoprazole (PROTONIX) 40 MG tablet Take 30-60 min before first meal of the day (Patient not taking: No sig reported)  . [DISCONTINUED] predniSONE (DELTASONE) 10 MG tablet Take 3  tablets (30 mg total) by mouth daily with breakfast.   No facility-administered encounter medications on file as of 11/28/2020.     Review of Systems  Constitutional: Negative for chills and fever.  Respiratory: Positive for shortness of breath.        Shortness of breath with exercising due to smoking.   Cardiovascular: Negative for chest pain, palpitations and leg swelling.  Neurological: Negative for dizziness, light-headedness and headaches.       Occassional headaches, has vertigo      Vitals BP 114/79   Pulse 97   Temp (!) 97.4 F (36.3 C)   Ht 5\' 6"  (1.676 m)   Wt 214 lb (97.1 kg)   SpO2 (!) 85%   BMI 34.54 kg/m   Objective:   Physical Exam Vitals reviewed.  Constitutional:      Appearance: Normal appearance.  HENT:     Right Ear: Tympanic membrane normal.     Left Ear: Tympanic membrane normal.     Nose: Nose normal.     Mouth/Throat:     Mouth: Mucous membranes are moist.     Pharynx: Oropharynx is clear.  Eyes:     Extraocular Movements: Extraocular movements intact.     Pupils: Pupils are equal, round, and reactive to light.  Cardiovascular:     Rate and Rhythm: Normal rate and regular rhythm.     Heart sounds: Normal heart sounds.  Pulmonary:     Effort: Pulmonary effort is  normal.     Breath sounds: Normal breath sounds.  Abdominal:     General: Bowel sounds are normal.     Tenderness: There is no abdominal tenderness.  Skin:    General: Skin is warm and dry.  Neurological:     General: No focal deficit present.     Mental Status: She is alert.  Psychiatric:        Behavior: Behavior normal.      Assessment and Plan   1. Wellness examination - IGP, Aptima HPV  2. Screening for cervical cancer - IGP, Aptima HPV  3. Elevated LDL cholesterol level - atorvastatin (LIPITOR) 10 MG tablet; Take 1 tablet (10 mg total) by mouth daily.  Dispense: 90 tablet; Refill: 1  4. Encounter for screening mammogram for breast cancer - MM 3D SCREEN  BREAST BILATERAL    Concerns: stress level affects head- happens more in last month. Has family member living with her.   Chronic conditions: having vertigo, menopausal- last menstrual period: does not remember.   Medication compliance: not taking any medicaitons regualrly for chronic conditions.  Well-controlled (no chest pain, no shortness of breath, no leg swelling, blood sugars controlled): Medication management interval: Monitors blood pressure: does not monitor at home.  Monitors blood sugars: Refills requested today:  Specialists: none currently   Health Maintenance:  Mammogram: last mammogram unknown- will order today.  Colonoscopy: will delay until 51 years Pap smear: today.  Immunizations: none  Lab review: LDL: 170 Triglycerides: 146 A1C: Renal: normal Hepatic: normal TSH: 1.250   Lifestyle:   Diet and exercise: no exercise Alcohol consumption: none Smoking cessation: stop smoking target date set by September.  Sexually active: not  Functional assessment: no limitations Vision: see eye doctor, due to go now.  Dental: every 6 months.    Safety measures recommended: seat belt use while in vehicle, safe sexual practices, no illicit drug use, no excessive alcohol use. Diet and exercise/ lifestyle modifications discussed. Recommend 150 minutes per week of exercise such as walking. Recommend lots of fresh produce to include fruits, vegetables, beans, healthy fats such as avocado, nuts, seeds, and 3-6 ounces of protein at each meal.  Avoid fried foods, fast food, and processed carbohydrates. Limit alcohol consumption: no more than one drink per day for women and 2 drinks per day for men. Alcohol use is not appropriate with certain medications.  Stress management discussed. Questions answered.    Agrees with plan of care discussed today. Understands warning signs to seek further care: chest pain, shortness of breath, any significant change in health.   Understands to follow-up in 6 months for HLD.     Brandy Bodo, NP 11/28/2020

## 2020-11-30 LAB — IGP, APTIMA HPV: HPV Aptima: NEGATIVE

## 2020-12-06 ENCOUNTER — Ambulatory Visit (HOSPITAL_COMMUNITY)
Admission: RE | Admit: 2020-12-06 | Discharge: 2020-12-06 | Disposition: A | Discharge status: Home / Self Care | Payer: 59 | Source: Ambulatory Visit | Attending: Family Medicine | Admitting: Family Medicine

## 2020-12-06 ENCOUNTER — Other Ambulatory Visit: Payer: Self-pay

## 2020-12-06 DIAGNOSIS — Z1231 Encounter for screening mammogram for malignant neoplasm of breast: Secondary | ICD-10-CM | POA: Insufficient documentation

## 2020-12-10 ENCOUNTER — Ambulatory Visit
Admission: EM | Admit: 2020-12-10 | Discharge: 2020-12-10 | Disposition: A | Discharge status: Home / Self Care | Payer: PRIVATE HEALTH INSURANCE | Attending: Internal Medicine | Admitting: Internal Medicine

## 2020-12-10 ENCOUNTER — Telehealth: Payer: Self-pay | Admitting: Internal Medicine

## 2020-12-10 ENCOUNTER — Other Ambulatory Visit: Payer: Self-pay

## 2020-12-10 DIAGNOSIS — J209 Acute bronchitis, unspecified: Secondary | ICD-10-CM | POA: Diagnosis not present

## 2020-12-10 MED ORDER — ALBUTEROL SULFATE (2.5 MG/3ML) 0.083% IN NEBU: 2.5000 mg | INHALATION_SOLUTION | Freq: Four times a day (QID) | RESPIRATORY_TRACT | 12 refills | Status: DC | PRN

## 2020-12-10 MED ORDER — FLUCONAZOLE 150 MG PO TABS: 150.0000 mg | ORAL_TABLET | Freq: Every day | ORAL | 0 refills | Status: DC

## 2020-12-10 MED ORDER — DOXYCYCLINE HYCLATE 100 MG PO CAPS: 100.0000 mg | ORAL_CAPSULE | Freq: Two times a day (BID) | ORAL | 0 refills | Status: DC

## 2020-12-10 NOTE — ED Triage Notes (Signed)
Watery eyes and throat tickling started last Tuesday.  Nasal congestion since Wednesday.  Has been taking mucinex to help with symptoms.  Had a tele appointment and was given dexamethasone.  States she has been using her albuterol nebulizer.  States she is coughing up thick yellow sputum.

## 2020-12-10 NOTE — Discharge Instructions (Addendum)
Use the neb treatments every 4 hours for 5 days, then as needed Follow up with your primary care doctor this week for follow up of your lungs and wheezing

## 2020-12-10 NOTE — ED Provider Notes (Signed)
[facility]    CSN: [id number] Arrival date & time: 12/10/20  0934      History   Chief Complaint No chief complaint on file.   HPI Brandy Hensley is a 50 y.o. female who presents due to not feeling better from what she thought it was her allergies and only took Mucinex congestion for 3 days, but no antihistamine. Had Telehealth apt and was prescribed dexamethasone.  Denies having a fever the whole time. Had negative covid test x 2 this weekend. She continues to have nose congestion, and is here because she has been wheezing. Has hx of COPD, and prone to bronchitis if the nose congestion drops to her chest.  Her cough is productive with yellow-green mucous. She does still smoke, but has cut down. Has a plans to stop.     Past Medical History:  Diagnosis Date  . COPD (chronic obstructive pulmonary disease) (HCC)   . Diverticulitis   . GERD (gastroesophageal reflux disease)   . PONV (postoperative nausea and vomiting)     Patient Active Problem List   Diagnosis Date Noted  . Wellness examination 11/28/2020  . Acute cystitis without hematuria 09/20/2020  . Dysuria 09/20/2020  . Antibiotic-induced yeast infection 09/20/2020  . DOE (dyspnea on exertion) 12/22/2018  . COPD GOLD II (barely)  but MZ/ smoker 12/22/2018  . Cigarette smoker 12/22/2018  . Hypokalemia due to excessive gastrointestinal loss of potassium 12/22/2018  . Perimenopause 02/26/2017  . Irregular menses 02/26/2017  . Reactive airway disease 02/26/2017  . Irritable bowel syndrome with diarrhea 01/15/2017  . Hyperlipidemia 07/05/2015  . GERD (gastroesophageal reflux disease) 10/31/2013  . Muscle spasms of neck 11/29/2012  . Morbid obesity (HCC) 11/29/2012  . Orthostatic hypotension 11/29/2012    Past Surgical History:  Procedure Laterality Date  . CHOLECYSTECTOMY  09/10/2012   Procedure: LAPAROSCOPIC CHOLECYSTECTOMY;  Surgeon: Dalia Heading, MD;  Location: [facility];  Service: General;  Laterality:  N/A;  . COLONOSCOPY  2005-2006   Dr.Rehman @[facility]   . TUBAL LIGATION      OB History   No obstetric history on file.      Home Medications    Prior to Admission medications   Medication Sig Start Date End Date Taking? Authorizing Provider  doxycycline (VIBRAMYCIN) 100 MG capsule Take 1 capsule (100 mg total) by mouth 2 (two) times daily. 12/10/20  Yes Rodriguez-Southworth, 12/12/20, PA-C  fluconazole (DIFLUCAN) 150 MG tablet Take 1 tablet (150 mg total) by mouth daily. 12/10/20  Yes Rodriguez-Southworth, 12/12/20, PA-C  albuterol (VENTOLIN HFA) 108 (90 Base) MCG/ACT inhaler Inhale 2 puffs into the lungs every 6 (six) hours as needed for wheezing or shortness of breath. 07/29/19   14/4/20, MD  atorvastatin (LIPITOR) 10 MG tablet Take 1 tablet (10 mg total) by mouth daily. 11/28/20   01/28/21, NP  cyclobenzaprine (FLEXERIL) 10 MG tablet TAKE 1 TABLET BY MOUTH 3 TIMES A DAY AS NEEDED FOR MUSCLE SPASMS 07/20/18   07/22/18, MD  Famotidine (PEPCID PO) Take 10 mg by mouth daily as needed (Acid after eating dinner).    [provider]  meclizine (ANTIVERT) 25 MG tablet Take 1 tablet (25 mg total) by mouth 3 (three) times daily as needed for dizziness. 11/09/20   11/11/20, Malena M, DO  buPROPion (WELLBUTRIN SR) 150 MG 12 hr tablet TAKE 1 BY MOUTH EVERY MORNING FOR 3 DAYS, THEN 1 TABLET TWICE A DAY Patient not taking: No sig reported 03/22/19 10/16/19  Merlyn Albert, MD  pantoprazole (PROTONIX) 40 MG tablet Take 30-60 min before first meal of the day Patient not taking: No sig reported 02/15/19 10/16/19  Nyoka Cowden, MD    Family History Family History  Problem Relation Age of Onset  . Hypertension Mother   . Osteoporosis Mother   . COPD Mother        smoked  . Heart attack Maternal Grandmother   . Cancer Maternal Grandmother 45       pancreatic cancer  . Heart disease Maternal Grandmother 76       MI/CABG  . Emphysema Father        smoked  . Lung cancer Father      Social History Social History   Tobacco Use  . Smoking status: Current Every Day Smoker    Packs/day: 1.00    Years: 31.00    Pack years: 31.00    Types: Cigarettes  . Smokeless tobacco: Never Used  Vaping Use  . Vaping Use: Never used  Substance Use Topics  . Alcohol use: No    Alcohol/week: 0.0 standard drinks  . Drug use: No     Allergies   Codeine, Flovent diskus [fluticasone propionate (inhal)], Augmentin [amoxicillin-pot clavulanate], Contrave [naltrexone-bupropion hcl er], and Qsymia [phentermine-topiramate]   Review of Systems Review of Systems  Constitutional: Positive for fatigue. Negative for activity change, appetite change, chills, diaphoresis and fever.  HENT: Positive for congestion and postnasal drip. Negative for ear discharge, ear pain, rhinorrhea, sore throat and trouble swallowing.   Eyes: Negative for discharge.  Respiratory: Positive for cough and wheezing. Negative for chest tightness and shortness of breath.   Cardiovascular: Negative for chest pain.  Gastrointestinal: Negative for diarrhea, nausea and vomiting.  Musculoskeletal: Negative for myalgias.  Skin: Negative for rash.  Neurological: Negative for headaches.  Hematological: Negative for adenopathy.     Physical Exam Triage Vital Signs ED Triage Vitals  Enc Vitals Group     BP 12/10/20 0945 138/75     Pulse Rate 12/10/20 0945 97     Resp 12/10/20 0945 18     Temp 12/10/20 0945 98.4 F (36.9 C)     Temp Source 12/10/20 0945 Oral     SpO2 12/10/20 0945 97 %     Weight --      Height --      Head Circumference --      Peak Flow --      Pain Score 12/10/20 0944 0     Pain Loc --      Pain Edu? --      Excl. in GC? --    No data found.  Updated Vital Signs BP 138/75 (BP Location: Right Arm)   Pulse 97   Temp 98.4 F (36.9 C) (Oral)   Resp 18   SpO2 97%   Visual Acuity Right Eye Distance:   Left Eye Distance:   Bilateral Distance:    Right Eye Near:   Left Eye  Near:    Bilateral Near:     Physical Exam Physical Exam Constitutional:      General: He is not in acute distress.    Appearance: He is not toxic-appearing.  HENT:     Head: Normocephalic.     Right Ear: Tympanic membrane, ear canal and external ear normal.     Left Ear: Ear canal and external ear normal.     Nose: Nose normal.     Mouth/Throat:  Mouth: Mucous membranes are moist.     Pharynx: Oropharynx is clear.  Eyes:     General: No scleral icterus.    Conjunctiva/sclera: Conjunctivae normal.  Cardiovascular:     Rate and Rhythm: Normal rate and regular rhythm.     Heart sounds: No murmur heard.   Pulmonary:     Effort: Pulmonary effort is normal. No respiratory distress.     Breath sounds: Wheezing present.     Comments: Has auditory wheezing Musculoskeletal:        General: Normal range of motion.     Cervical back: Neck supple.  Lymphadenopathy:     Cervical: No cervical adenopathy.  Skin:    General: Skin is warm and dry.     Findings: No rash.  Neurological:     Mental Status: He is alert and oriented to person, place, and time.     Gait: Gait normal.  Psychiatric:        Mood and Affect: Mood normal.        Behavior: Behavior normal.        Thought Content: Thought content normal.        Judgment: Judgment normal.     UC Treatments / Results  Labs (all labs ordered are listed, but only abnormal results are displayed) Labs Reviewed - No data to display  EKG   Radiology No results found.  Procedures Procedures (including critical care time)  Medications Ordered in UC Medications - No data to display  Initial Impression / Assessment and Plan / UC Course  I have reviewed the triage vital signs and the nursing notes. I placed her on Doxy and refilled her Alboterol nebs Final Clinical Impressions(s) / UC Diagnoses   Final diagnoses:  Acute bronchitis, unspecified organism     Discharge Instructions     Use the neb treatments every 4  hours for 5 days, then as needed Follow up with your primary care doctor this week for follow up of your lungs and wheezing     ED Prescriptions    Medication Sig Dispense Auth. Provider   doxycycline (VIBRAMYCIN) 100 MG capsule Take 1 capsule (100 mg total) by mouth 2 (two) times daily. 20 capsule Rodriguez-Southworth, Nettie Elm, PA-C   fluconazole (DIFLUCAN) 150 MG tablet Take 1 tablet (150 mg total) by mouth daily. 2 tablet Rodriguez-Southworth, Nettie Elm, PA-C     PDMP not reviewed this encounter.   Garey Ham, [place] 12/11/20 [phone]

## 2020-12-13 ENCOUNTER — Ambulatory Visit (INDEPENDENT_AMBULATORY_CARE_PROVIDER_SITE_OTHER): Payer: 59 | Admitting: Family Medicine

## 2020-12-13 ENCOUNTER — Encounter: Payer: Self-pay | Admitting: Family Medicine

## 2020-12-13 ENCOUNTER — Other Ambulatory Visit: Payer: Self-pay

## 2020-12-13 VITALS — HR 88 | Temp 98.9°F | Resp 16 | Wt 216.2 lb

## 2020-12-13 DIAGNOSIS — J40 Bronchitis, not specified as acute or chronic: Secondary | ICD-10-CM | POA: Insufficient documentation

## 2020-12-13 DIAGNOSIS — J019 Acute sinusitis, unspecified: Secondary | ICD-10-CM

## 2020-12-13 DIAGNOSIS — B9689 Other specified bacterial agents as the cause of diseases classified elsewhere: Secondary | ICD-10-CM | POA: Diagnosis not present

## 2020-12-13 MED ORDER — DEXAMETHASONE 1.5 MG PO TABS: 1.5000 mg | ORAL_TABLET | Freq: Two times a day (BID) | ORAL | 0 refills | Status: DC

## 2020-12-13 NOTE — Patient Instructions (Addendum)
Continue taking doxycycline as prescribed. Take some dexamethasone  1.5 mg twice daily for the next 4 days. Continue nebulizers every 4 hours as prescribed by urgent care Sinus rinses/humidification to clean out sinus passages Supportive therapy, adequate hydration.  Prednisone warning: Prednisone is an anti-inflammatory medication. It is best to only use it for short periods of time. It can cause  your blood pressure to increase and it can cause your blood sugar to increase. If you are a diabetic, please check your blood sugar twice per day while you are taking prednisone. If your sugars are > 200 or < 70 please seek medical attention right away.     How to Perform a Sinus Rinse A sinus rinse is a home treatment. It rinses your sinuses with a mixture of salt and water (saline solution). Sinuses are air-filled spaces in your skull behind the bones of your face and forehead. They open into your nasal cavity. A sinus rinse can help to clear your nasal cavity. It can clear mucus, dirt, dust, or pollen. You may do a sinus rinse when you have:  A cold.  A virus.  Allergies.  A sinus infection.  A stuffy nose. Talk with your doctor about whether a sinus rinse might help you. What are the risks? A sinus rinse is normally very safe and helpful. However, there are a few risks. These include:  A burning feeling in the sinuses. This may happen if you do not make the saline solution as instructed. Be sure to follow all directions when making the saline solution.  Nasal irritation.  Infection from unclean water. This is rare, but possible. Do not do a sinus rinse if you have had:  Ear or nasal surgery.  An ear infection.  Blocked ears. Supplies needed:  Saline solution or powder.  Distilled or germ-free (sterile) water may be needed to mix with saline powder. ? You may use boiled and cooled tap water. Boil tap water for 5 minutes; cool until it is lukewarm. Use within 24 hours. ? Do  not use regular tap water to mix with the saline solution.  Neti pot or nasal rinse bottle. This releases the saline solution into your nose and through your sinuses. You can buy neti pots and rinse bottles: ? At your local pharmacy. ? At a health food store. ? Online. How to perform a sinus rinse 1. Wash your hands with soap and water. 2. Wash your device using the directions that came with it. 3. Dry your device. 4. Use the solution that comes with your device or one that is sold separately in stores. Follow the mixing directions on the package if you need to mix with sterile or distilled water. 5. Fill your device with the amount of saline solution stated in the device instructions. 6. Stand over a sink and tilt your head sideways over the sink. 7. Place the spout of the device in your upper nostril (the one closer to the ceiling). 8. Gently pour or squeeze the saline solution into your nasal cavity. The liquid should drain to your lower nostril if you are not too stuffed up (congested). 9. While rinsing, breathe through your open mouth. 10. Gently blow your nose to clear any mucus and rinse solution. Blowing too hard may cause ear pain. 11. Repeat in your other nostril. 12. Clean and rinse your device with clean water. 13. Air-dry your device. Talk with your doctor or pharmacist if you have questions about how to do a sinus  rinse.   Summary  A sinus rinse is a home treatment. It rinses your sinuses with a mixture of salt and water (saline solution).  A sinus rinse is normally very safe and helpful. Follow all instructions carefully.  Talk with your doctor about whether a sinus rinse might help you. This information is not intended to replace advice given to you by your health care provider. Make sure you discuss any questions you have with your health care provider. Document Revised: 05/22/2020 Document Reviewed: 05/22/2020 Elsevier Patient Education  2021 [employer].

## 2020-12-13 NOTE — Progress Notes (Signed)
Patient ID: Brandy Hensley, female    DOB: 02-10-1971, 50 y.o.   MRN: [id number]   Chief Complaint  Patient presents with  . Follow-up    From Urgent Care. Dx with Bronchitis -given Doxycycline and not feeling better- wheezing    Subjective:  CC: Follow-up from urgent care visit for bronchitis  Presents today for an acute visit for follow-up for bronchitis.  Reports that on April 15, had a telehealth visit, was prescribed dexamethasone, started taking on Saturday, Sunday completed doses on Monday.  On April 18, went to urgent care, diagnosed with bronchitis, given doxycycline twice per day for 10 days, encouraged to do nebulizers every 4 hours.  Reports that she is not feeling any better.  Denies fever, chills, chest pain, endorses congestion, sinus pain and pressure, cough some shortness of breath with exertion, and wheezing.  Also endorses an occasional headache.  Has been on antibiotics, using nebulizers, and completed dexamethasone short course.    Medical History Krysta has a past medical history of COPD (chronic obstructive pulmonary disease) (HCC), Diverticulitis, GERD (gastroesophageal reflux disease), and PONV (postoperative nausea and vomiting).   Outpatient Encounter Medications as of 12/13/2020  Medication Sig  . dexamethasone (DECADRON) 1.5 MG tablet Take 1 tablet (1.5 mg total) by mouth 2 (two) times daily.  Marland Kitchen albuterol (PROVENTIL) (2.5 MG/3ML) 0.083% nebulizer solution Take 3 mLs (2.5 mg total) by nebulization every 6 (six) hours as needed for wheezing or shortness of breath.  Marland Kitchen albuterol (VENTOLIN HFA) 108 (90 Base) MCG/ACT inhaler Inhale 2 puffs into the lungs every 6 (six) hours as needed for wheezing or shortness of breath.  Marland Kitchen atorvastatin (LIPITOR) 10 MG tablet Take 1 tablet (10 mg total) by mouth daily.  . cyclobenzaprine (FLEXERIL) 10 MG tablet TAKE 1 TABLET BY MOUTH 3 TIMES A DAY AS NEEDED FOR MUSCLE SPASMS  . doxycycline (VIBRAMYCIN) 100 MG capsule Take 1 capsule (100  mg total) by mouth 2 (two) times daily.  . Famotidine (PEPCID PO) Take 10 mg by mouth daily as needed (Acid after eating dinner).  . fluconazole (DIFLUCAN) 150 MG tablet Take 1 tablet (150 mg total) by mouth daily.  . meclizine (ANTIVERT) 25 MG tablet Take 1 tablet (25 mg total) by mouth 3 (three) times daily as needed for dizziness.  . [DISCONTINUED] buPROPion (WELLBUTRIN SR) 150 MG 12 hr tablet TAKE 1 BY MOUTH EVERY MORNING FOR 3 DAYS, THEN 1 TABLET TWICE A DAY (Patient not taking: No sig reported)  . [DISCONTINUED] pantoprazole (PROTONIX) 40 MG tablet Take 30-60 min before first meal of the day (Patient not taking: No sig reported)   No facility-administered encounter medications on file as of 12/13/2020.     Review of Systems  Constitutional: Negative for chills and fever.  HENT: Positive for congestion, sinus pressure and sinus pain. Negative for ear pain.   Respiratory: Positive for cough, shortness of breath and wheezing.        With exertion  Cardiovascular: Negative for chest pain.  Gastrointestinal: Negative for abdominal pain.  Neurological: Positive for headaches.       Occasional h/a     Vitals Pulse 88   Temp 98.9 F (37.2 C)   Resp 16   Wt 216 lb 3.2 oz (98.1 kg)   SpO2 96%   BMI 34.90 kg/m   Objective:   Physical Exam Vitals reviewed.  Constitutional:      Appearance: Normal appearance.  HENT:     Right Ear: Tympanic membrane normal.  Left Ear: Tympanic membrane normal.     Nose:     Right Turbinates: Swollen.     Left Turbinates: Swollen.     Right Sinus: Maxillary sinus tenderness present. No frontal sinus tenderness.     Left Sinus: Maxillary sinus tenderness present. No frontal sinus tenderness.     Mouth/Throat:     Pharynx: Uvula midline. No posterior oropharyngeal erythema.  Cardiovascular:     Rate and Rhythm: Normal rate and regular rhythm.     Heart sounds: Normal heart sounds.  Pulmonary:     Effort: Pulmonary effort is normal.      Breath sounds: Normal breath sounds.  Skin:    General: Skin is warm and dry.  Neurological:     General: No focal deficit present.     Mental Status: She is alert.  Psychiatric:        Behavior: Behavior normal.      Assessment and Plan   1. Acute bacterial rhinosinusitis  2. Bronchitis - dexamethasone (DECADRON) 1.5 MG tablet; Take 1 tablet (1.5 mg total) by mouth 2 (two) times daily.  Dispense: 8 tablet; Refill: 0    Started doxycycline on April 18, continue to take full course to equal 10 days.  Continue nebulizer treatments every 4 hours as needed for wheezing.  No acute distress, no wheezing, encouraged supportive therapy, sinus rinses, adequate hydration.  We will add 4 more days dexamethasone for bronchitis.   Prednisone warning: Prednisone is an anti-inflammatory medication. It is best to only use it for short periods of time. It can cause  your blood pressure to increase and it can cause your blood sugar to increase. If you are a diabetic, please check your blood sugar twice per day while you are taking prednisone. If your sugars are > 200 or < 70 please seek medical attention right away.    Agrees with plan of care discussed today. Understands warning signs to seek further care: chest pain, shortness of breath, any significant change in health.  Understands to follow-up if symptoms do not improve, or worsen after completing doxycycline course.  Reports that she is working from home, will try to take it easy for the next couple days for adequate rest.  Dorena Bodo, NP 12/13/20

## 2020-12-19 ENCOUNTER — Telehealth: Payer: Self-pay

## 2020-12-19 NOTE — Telephone Encounter (Signed)
Novella Olive, NP       It is possible the diarrhea is from antibiotic, however, she did not have these symptoms when I saw her last week. I am happy to see her again as these are new findings. (or she can wait to see if symptoms resolve after completing antibiotic)    Thanks, KD

## 2020-12-19 NOTE — Telephone Encounter (Signed)
Pt was put on doxycycline (VIBRAMYCIN) 100 MG capsule  She is at the end of the antibiotic she was put on this from urgent care and did a follow up with Clydie Braun, Pt is pain in lower belly and diarrhea.  Pt call back [phone]

## 2020-12-19 NOTE — Telephone Encounter (Signed)
Patient scheduled follow up with Clydie Braun NP 12/20/20

## 2020-12-20 ENCOUNTER — Ambulatory Visit: Payer: 59 | Admitting: Family Medicine

## 2020-12-26 NOTE — Telephone Encounter (Signed)
Opened in error

## 2021-04-04 ENCOUNTER — Other Ambulatory Visit: Payer: Self-pay

## 2021-04-04 ENCOUNTER — Ambulatory Visit (INDEPENDENT_AMBULATORY_CARE_PROVIDER_SITE_OTHER): Payer: 59 | Admitting: Family Medicine

## 2021-04-04 VITALS — BP 92/66 | HR 90 | Temp 96.6°F | Ht 66.0 in | Wt 231.0 lb

## 2021-04-04 DIAGNOSIS — M5431 Sciatica, right side: Secondary | ICD-10-CM | POA: Diagnosis not present

## 2021-04-04 MED ORDER — NAPROXEN 500 MG PO TABS: 500.0000 mg | ORAL_TABLET | Freq: Two times a day (BID) | ORAL | 0 refills | Status: DC

## 2021-04-04 NOTE — Progress Notes (Signed)
Patient ID: Brandy Hensley, female    DOB: [birth year], 50 y.o.   MRN: [id number]    Subjective:    HPI  R lower back pain x 2 days , mild hip pain  Taking ibuprofen and tylenol  Started 2 days ago.  Getting up off toilet and felt instant pain in right lower back. Hurting so bad and in tears.  Hurting all night. Pain has slightly improved.  9:30am yesterday took flexeril and it improved some. Taking some advil 200mg  and tylenol.  Sharp stabbing pain with moving the leg on right. When laying flat and pain with lifting leg up. Feels it's radiating down the rt buttock area. No numbness or tingling down the leg. No saddle anesthesia or bowel or bladder incontinence. No h/o cancer, no trauma to back or fall.    Medical History Marbella has a past medical history of COPD (chronic obstructive pulmonary disease) (HCC), Diverticulitis, GERD (gastroesophageal reflux disease), and PONV (postoperative nausea and vomiting).   Outpatient Encounter Medications as of 04/04/2021  Medication Sig   acetaminophen (TYLENOL) 325 MG tablet Take 650 mg by mouth every 6 (six) hours as needed.   cyclobenzaprine (FLEXERIL) 10 MG tablet TAKE 1 TABLET BY MOUTH 3 TIMES A DAY AS NEEDED FOR MUSCLE SPASMS   Famotidine (PEPCID PO) Take 10 mg by mouth daily as needed (Acid after eating dinner).   ibuprofen (ADVIL) 200 MG tablet Take 200 mg by mouth every 6 (six) hours as needed.   naproxen (NAPROSYN) 500 MG tablet Take 1 tablet (500 mg total) by mouth 2 (two) times daily with a meal.   albuterol (PROVENTIL) (2.5 MG/3ML) 0.083% nebulizer solution Take 3 mLs (2.5 mg total) by nebulization every 6 (six) hours as needed for wheezing or shortness of breath. (Patient not taking: Reported on 04/04/2021)   albuterol (VENTOLIN HFA) 108 (90 Base) MCG/ACT inhaler Inhale 2 puffs into the lungs every 6 (six) hours as needed for wheezing or shortness of breath. (Patient not taking: Reported on 04/04/2021)   atorvastatin (LIPITOR) 10  MG tablet Take 1 tablet (10 mg total) by mouth daily. (Patient not taking: Reported on 04/04/2021)   meclizine (ANTIVERT) 25 MG tablet Take 1 tablet (25 mg total) by mouth 3 (three) times daily as needed for dizziness. (Patient not taking: Reported on 04/04/2021)   [DISCONTINUED] buPROPion (WELLBUTRIN SR) 150 MG 12 hr tablet TAKE 1 BY MOUTH EVERY MORNING FOR 3 DAYS, THEN 1 TABLET TWICE A DAY (Patient not taking: No sig reported)   [DISCONTINUED] dexamethasone (DECADRON) 1.5 MG tablet Take 1 tablet (1.5 mg total) by mouth 2 (two) times daily. (Patient not taking: Reported on 04/04/2021)   [DISCONTINUED] doxycycline (VIBRAMYCIN) 100 MG capsule Take 1 capsule (100 mg total) by mouth 2 (two) times daily. (Patient not taking: Reported on 04/04/2021)   [DISCONTINUED] fluconazole (DIFLUCAN) 150 MG tablet Take 1 tablet (150 mg total) by mouth daily.   [DISCONTINUED] pantoprazole (PROTONIX) 40 MG tablet Take 30-60 min before first meal of the day (Patient not taking: No sig reported)   No facility-administered encounter medications on file as of 04/04/2021.     Review of Systems  Constitutional:  Negative for chills and fever.  HENT:  Negative for congestion, rhinorrhea and sore throat.   Respiratory:  Negative for cough, shortness of breath and wheezing.   Cardiovascular:  Negative for chest pain and leg swelling.  Gastrointestinal:  Negative for abdominal pain, diarrhea, nausea and vomiting.  Genitourinary:  Negative for dysuria and frequency.  Musculoskeletal:  Positive for back pain (rt back pain, lower). Negative for arthralgias.  Skin:  Negative for rash.  Neurological:  Negative for dizziness, weakness and headaches.    Vitals BP 92/66   Pulse 90   Temp (!) 96.6 F (35.9 C)   Ht 5\' 6"  (1.676 m)   Wt 231 lb (104.8 kg)   SpO2 96%   BMI 37.28 kg/m   Objective:   Physical Exam Vitals and nursing note reviewed.  Constitutional:      Appearance: Normal appearance.  HENT:     Head:  Normocephalic and atraumatic.     Nose: Nose normal.     Mouth/Throat:     Mouth: Mucous membranes are moist.     Pharynx: Oropharynx is clear.  Eyes:     Extraocular Movements: Extraocular movements intact.     Conjunctiva/sclera: Conjunctivae normal.     Pupils: Pupils are equal, round, and reactive to light.  Cardiovascular:     Rate and Rhythm: Normal rate and regular rhythm.     Pulses: Normal pulses.     Heart sounds: Normal heart sounds.  Pulmonary:     Effort: Pulmonary effort is normal.     Breath sounds: Normal breath sounds. No wheezing, rhonchi or rales.  Musculoskeletal:        General: Tenderness present. Normal range of motion.     Right lower leg: No edema.     Left lower leg: No edema.  Skin:    General: Skin is warm and dry.     Findings: No lesion or rash.  Neurological:     General: No focal deficit present.     Mental Status: She is alert and oriented to person, place, and time.  Psychiatric:        Mood and Affect: Mood normal.        Behavior: Behavior normal.   Back exam- normal inspection. No ttp over spinous process in lumbar. +SLR or right. Negative SLR on left. Dec rom with flexion/extension. +ttp over right SI joint and piriformis.  Assessment and Plan   1. Sciatica of right side - naproxen (NAPROSYN) 500 MG tablet; Take 1 tablet (500 mg total) by mouth 2 (two) times daily with a meal.  Dispense: 30 tablet; Refill: 0   Advised to pt to take an adult dose of medication, and prescribed naprosyn 500mg  bid for 7-10 days. Flexeril prn. Heat/ice 3x per day. Call or rto if not improving or worsening symptoms.   Return if symptoms worsen or fail to improve.

## 2021-05-31 ENCOUNTER — Other Ambulatory Visit: Payer: Self-pay

## 2021-05-31 ENCOUNTER — Encounter: Payer: Self-pay | Admitting: Emergency Medicine

## 2021-05-31 ENCOUNTER — Ambulatory Visit: Payer: PRIVATE HEALTH INSURANCE

## 2021-05-31 ENCOUNTER — Ambulatory Visit
Admission: EM | Admit: 2021-05-31 | Discharge: 2021-05-31 | Disposition: A | Discharge status: Home / Self Care | Payer: PRIVATE HEALTH INSURANCE | Attending: Family Medicine | Admitting: Family Medicine

## 2021-05-31 DIAGNOSIS — R002 Palpitations: Secondary | ICD-10-CM

## 2021-05-31 DIAGNOSIS — I493 Ventricular premature depolarization: Secondary | ICD-10-CM | POA: Diagnosis not present

## 2021-05-31 NOTE — ED Provider Notes (Signed)
[facility]    CSN: [id number] Arrival date & time: 05/31/21  6283      History   Chief Complaint No chief complaint on file.   HPI Brandy Hensley is a 50 y.o. female.   Patient presenting today with 2-week history of palpitations off and on.  She denies associated diaphoresis, nausea, vomiting, chest pain, shortness of breath beyond her baseline from COPD.  States this has never happened to her before prior to 05/12/2021.  Denies any known history of cardiac issues, does have COPD and hyperlipidemia.  No new medications, routine or stress or changes, new medical changes that she is aware of.  Past Medical History:  Diagnosis Date   COPD (chronic obstructive pulmonary disease) (HCC)    Diverticulitis    GERD (gastroesophageal reflux disease)    PONV (postoperative nausea and vomiting)    Patient Active Problem List   Diagnosis Date Noted   Acute bacterial rhinosinusitis 12/13/2020   Bronchitis 12/13/2020   Wellness examination 11/28/2020   Acute cystitis without hematuria 09/20/2020   Dysuria 09/20/2020   Antibiotic-induced yeast infection 09/20/2020   DOE (dyspnea on exertion) 12/22/2018   COPD GOLD II (barely)  but MZ/ smoker 12/22/2018   Cigarette smoker 12/22/2018   Hypokalemia due to excessive gastrointestinal loss of potassium 12/22/2018   Perimenopause 02/26/2017   Irregular menses 02/26/2017   Reactive airway disease 02/26/2017   Irritable bowel syndrome with diarrhea 01/15/2017   Hyperlipidemia 07/05/2015   GERD (gastroesophageal reflux disease) 10/31/2013   Muscle spasms of neck 11/29/2012   Morbid obesity (HCC) 11/29/2012   Orthostatic hypotension 11/29/2012    Past Surgical History:  Procedure Laterality Date   CHOLECYSTECTOMY  09/10/2012   Procedure: LAPAROSCOPIC CHOLECYSTECTOMY;  Surgeon: Dalia Heading, MD;  Location: [facility];  Service: General;  Laterality: N/A;   COLONOSCOPY  2005-2006   Dr.Rehman @[facility]    TUBAL LIGATION      OB History    No obstetric history on file.      Home Medications    Prior to Admission medications   Medication Sig Start Date End Date Taking? Authorizing Provider  acetaminophen (TYLENOL) 325 MG tablet Take 650 mg by mouth every 6 (six) hours as needed.    [provider]  albuterol (PROVENTIL) (2.5 MG/3ML) 0.083% nebulizer solution Take 3 mLs (2.5 mg total) by nebulization every 6 (six) hours as needed for wheezing or shortness of breath. Patient not taking: Reported on 04/04/2021 12/10/20   Rodriguez-Southworth, 12/12/20, PA-C  albuterol (VENTOLIN HFA) 108 (90 Base) MCG/ACT inhaler Inhale 2 puffs into the lungs every 6 (six) hours as needed for wheezing or shortness of breath. Patient not taking: Reported on 04/04/2021 07/29/19   14/4/20, MD  atorvastatin (LIPITOR) 10 MG tablet Take 1 tablet (10 mg total) by mouth daily. Patient not taking: Reported on 04/04/2021 11/28/20   01/28/21, NP  cyclobenzaprine (FLEXERIL) 10 MG tablet TAKE 1 TABLET BY MOUTH 3 TIMES A DAY AS NEEDED FOR MUSCLE SPASMS 07/20/18   07/22/18, MD  Famotidine (PEPCID PO) Take 10 mg by mouth daily as needed (Acid after eating dinner).    [provider]  ibuprofen (ADVIL) 200 MG tablet Take 200 mg by mouth every 6 (six) hours as needed.    [provider]  meclizine (ANTIVERT) 25 MG tablet Take 1 tablet (25 mg total) by mouth 3 (three) times daily as needed for dizziness. Patient not taking: Reported on 04/04/2021 11/09/20  Ladona Ridgel, Malena M, DO  naproxen (NAPROSYN) 500 MG tablet Take 1 tablet (500 mg total) by mouth 2 (two) times daily with a meal. 04/04/21   Ladona Ridgel, Malena M, DO  buPROPion (WELLBUTRIN SR) 150 MG 12 hr tablet TAKE 1 BY MOUTH EVERY MORNING FOR 3 DAYS, THEN 1 TABLET TWICE A DAY Patient not taking: No sig reported 03/22/19 10/16/19  Merlyn Albert, MD  pantoprazole (PROTONIX) 40 MG tablet Take 30-60 min before first meal of the day Patient not taking: No sig reported 02/15/19  10/16/19  Nyoka Cowden, MD    Family History Family History  Problem Relation Age of Onset   Hypertension Mother    Osteoporosis Mother    COPD Mother        smoked   Heart attack Maternal Grandmother    Cancer Maternal Grandmother 14       pancreatic cancer   Heart disease Maternal Grandmother 59       MI/CABG   Emphysema Father        smoked   Lung cancer Father     Social History Social History   Tobacco Use   Smoking status: Every Day    Packs/day: 1.00    Years: 31.00    Pack years: 31.00    Types: Cigarettes   Smokeless tobacco: Never  Vaping Use   Vaping Use: Never used  Substance Use Topics   Alcohol use: No    Alcohol/week: 0.0 standard drinks   Drug use: No     Allergies   Codeine, Flovent diskus [fluticasone propionate (inhal)], Augmentin [amoxicillin-pot clavulanate], Contrave [naltrexone-bupropion hcl er], and Qsymia [phentermine-topiramate]   Review of Systems Review of Systems Per HPI  Physical Exam Triage Vital Signs ED Triage Vitals  Enc Vitals Group     BP 05/31/21 0904 121/68     Pulse Rate 05/31/21 0904 88     Resp 05/31/21 0904 16     Temp 05/31/21 0904 98.2 F (36.8 C)     Temp Source 05/31/21 0904 Oral     SpO2 05/31/21 0904 96 %     Weight --      Height --      Head Circumference --      Peak Flow --      Pain Score 05/31/21 0912 0     Pain Loc --      Pain Edu? --      Excl. in GC? --    No data found.  Updated Vital Signs BP 121/68 (BP Location: Right Arm)   Pulse 88   Temp 98.2 F (36.8 C) (Oral)   Resp 16   SpO2 96%   Visual Acuity Right Eye Distance:   Left Eye Distance:   Bilateral Distance:    Right Eye Near:   Left Eye Near:    Bilateral Near:     Physical Exam Vitals and nursing note reviewed.  Constitutional:      Appearance: Normal appearance. She is not ill-appearing.  HENT:     Head: Atraumatic.     Mouth/Throat:     Mouth: Mucous membranes are moist.  Eyes:     Extraocular  Movements: Extraocular movements intact.     Conjunctiva/sclera: Conjunctivae normal.  Cardiovascular:     Rate and Rhythm: Normal rate and regular rhythm.     Heart sounds: Normal heart sounds.  Pulmonary:     Effort: Pulmonary effort is normal. No respiratory distress.     Breath sounds: Normal  breath sounds. No wheezing or rales.  Abdominal:     General: Bowel sounds are normal. There is no distension.     Palpations: Abdomen is soft.     Tenderness: There is no abdominal tenderness. There is no guarding.  Musculoskeletal:        General: Normal range of motion.     Cervical back: Normal range of motion and neck supple.  Skin:    General: Skin is warm and dry.  Neurological:     Mental Status: She is alert and oriented to person, place, and time.  Psychiatric:        Mood and Affect: Mood normal.        Thought Content: Thought content normal.        Judgment: Judgment normal.     UC Treatments / Results  Labs (all labs ordered are listed, but only abnormal results are displayed) Labs Reviewed - No data to display  EKG   Radiology No results found.  Procedures Procedures (including critical care time)  Medications Ordered in UC Medications - No data to display  Initial Impression / Assessment and Plan / UC Course  I have reviewed the triage vital signs and the nursing notes.  Pertinent labs & imaging results that were available during my care of the patient were reviewed by me and considered in my medical decision making (see chart for details).     Vital signs and exam benign and reassuring today.  EKG showing sinus rhythm with occasional PVCs at 84 bpm.  No ST or T wave changes noted.  Suspect her palpitations are related to these PVCs.  Discussed with her that we are not able to fully rule out an MI in the setting and that she will need to go to the emergency department for that but from my clinical impression she can follow-up with outpatient cardiology first  thing next week.  She is aware to go to the emergency department if her symptoms worsen at any time.  Final Clinical Impressions(s) / UC Diagnoses   Final diagnoses:  PVC (premature ventricular contraction)  Palpitations   Discharge Instructions   None    ED Prescriptions   None    PDMP not reviewed this encounter.   Roosvelt Maser [place], [place] 05/31/21 [phone]

## 2021-05-31 NOTE — ED Triage Notes (Signed)
States she has has some fluttering in her chest since 9/18.  States it feels like a pause and then a heart beat.  Feels tired

## 2021-06-03 NOTE — Progress Notes (Signed)
Cardiology Office Note  Date: 06/04/2021   ID: Brandy Hensley, DOB [birth year], MRN [id number]  PCP:  No primary care provider on file.  Cardiologist:  Nona Dell, MD Electrophysiologist:  None   Chief Complaint  Patient presents with   Cardiac follow-up    History of Present Illness: Brandy Hensley is a 50 y.o. female last seen in March 2020.  She presents for a follow-up visit with concerns about recent palpitations.  She states that since doing a lot of physical activity and working outdoors on September 17 she has had a sense of recurring palpitations described as a skipped and sometimes racing heartbeat.  She tends to notice this more when she is still as in the evenings.  She has not had any associated chest pain or syncope.  I personally reviewed her ECG from October 7 which showed sinus rhythm with PVC.  Cardiac monitor done back in 2020 showed sinus rhythm with only rare PACs and PVCs.  She feels like her present symptoms are different from those experienced back in 2020.  She is in the process of transitioning PCP since Dr. Ladona Ridgel has left practice with Dr. Gerda Diss.  I did review her interval lab work.  She was started on Lipitor 10 mg daily but was not able to stay on it, complained of headache and leg aching.   Past Medical History:  Diagnosis Date   COPD (chronic obstructive pulmonary disease) (HCC)    Diverticulitis    GERD (gastroesophageal reflux disease)    PONV (postoperative nausea and vomiting)     Past Surgical History:  Procedure Laterality Date   CHOLECYSTECTOMY  09/10/2012   Procedure: LAPAROSCOPIC CHOLECYSTECTOMY;  Surgeon: Dalia Heading, MD;  Location: [facility];  Service: General;  Laterality: N/A;   COLONOSCOPY  2005-2006   Dr.Rehman @[facility]    TUBAL LIGATION      Current Outpatient Medications  Medication Sig Dispense Refill   acetaminophen (TYLENOL) 325 MG tablet Take 650 mg by mouth every 6 (six) hours as needed.     albuterol (PROVENTIL) (2.5  MG/3ML) 0.083% nebulizer solution Take 3 mLs (2.5 mg total) by nebulization every 6 (six) hours as needed for wheezing or shortness of breath. 75 mL 12   albuterol (VENTOLIN HFA) 108 (90 Base) MCG/ACT inhaler Inhale 2 puffs into the lungs every 6 (six) hours as needed for wheezing or shortness of breath. 18 g 1   cyclobenzaprine (FLEXERIL) 10 MG tablet TAKE 1 TABLET BY MOUTH 3 TIMES A DAY AS NEEDED FOR MUSCLE SPASMS 30 tablet 1   Famotidine (PEPCID PO) Take 10 mg by mouth daily as needed (Acid after eating dinner).     ibuprofen (ADVIL) 200 MG tablet Take 200 mg by mouth every 6 (six) hours as needed.     meclizine (ANTIVERT) 25 MG tablet Take 1 tablet (25 mg total) by mouth 3 (three) times daily as needed for dizziness. 30 tablet 0   No current facility-administered medications for this visit.   Allergies:  Codeine, Flovent diskus [fluticasone propionate (inhal)], Augmentin [amoxicillin-pot clavulanate], Contrave [naltrexone-bupropion hcl er], and Qsymia [phentermine-topiramate]   Social History: The patient  reports that she has been smoking cigarettes. She has a 31.00 pack-year smoking history. She has never used smokeless tobacco. She reports that she does not drink alcohol and does not use drugs.   Family History: The patient's family history includes COPD in her mother; Cancer (age of onset: 53) in her maternal grandmother; Emphysema in her father;  Heart attack in her maternal grandmother; Heart disease (age of onset: 85) in her maternal grandmother; Hypertension in her mother; Lung cancer in her father; Osteoporosis in her mother.   ROS: No syncope.  Physical Exam: VS:  BP 130/82   Pulse 82   Ht 5\' 7"  (1.702 m)   Wt 211 lb (95.7 kg)   SpO2 97%   BMI 33.05 kg/m , BMI Body mass index is 33.05 kg/m.  Wt Readings from Last 3 Encounters:  06/04/21 211 lb (95.7 kg)  04/04/21 231 lb (104.8 kg)  12/13/20 216 lb 3.2 oz (98.1 kg)    General: Patient appears comfortable at rest. HEENT:  Conjunctiva and lids normal, wearing a mask. Neck: No elevated JVP. Lungs: Clear to auscultation, nonlabored breathing at rest. Cardiac: Regular rate and rhythm, no S3 or significant systolic murmur, no pericardial rub. Extremities: No pitting edema. Skin: Warm and dry. Musculoskeletal: No kyphosis. Neuropsychiatric: Alert and oriented x3, affect grossly appropriate.  ECG:  An ECG dated 10/31/2020 was personally reviewed today and demonstrated:  Sinus rhythm.  Recent Labwork: 11/22/2020: ALT 16; AST 22; BUN 5; Creatinine, Ser 0.99; Hemoglobin 14.7; Platelets 204; Potassium 4.0; Sodium 141; TSH 1.250     Component Value Date/Time   CHOL 242 (H) 11/22/2020 0902   TRIG 146 11/22/2020 0902   HDL 45 11/22/2020 0902   CHOLHDL 5.4 (H) 11/22/2020 0902   CHOLHDL 4.2 10/17/2013 0750   VLDL 15 10/17/2013 0750   LDLCALC 170 (H) 11/22/2020 0902    Other Studies Reviewed Today:  Cardiac monitor April 2020: 14 day event recorder reviewed. Predominant rhythm is sinus. Heart rate ranged from 56 bpm up to 135 bpm with average heart rate 84 bpm. There were rare PACs and PVCs with otherwise no significant arrhythmias or pauses.  Assessment and Plan:  1.  Palpitations as discussed above, possibly PVCs based on description.  Recent ECG did show a single PVC.  Monitoring from 2020 did not show any unusual degree of ventricular ectopy, but she states that her symptoms feel different and more frequent at this point.  She has had no associated syncope.  Plan to obtain a 72-hour Zio patch for further investigation.  Also discussed limiting caffeine (she drinks tea).  TSH was normal in March.  2.  Mixed hyperlipidemia, LDL 170 in March.  She did not tolerate low-dose Lipitor due to headache and leg aches.  Plans to follow-up with new PCP.  Could consider trial of low-dose Crestor.  Medication Adjustments/Labs and Tests Ordered: Current medicines are reviewed at length with the patient today.  Concerns regarding  medicines are outlined above.   Tests Ordered: Orders Placed This Encounter  Procedures   Flu Vaccine QUAD 43mo+IM (Fluarix, Fluzone & Alfiuria Quad PF)    Medication Changes: No orders of the defined types were placed in this encounter.   Disposition:  Follow up  test results.  Signed, 5mo, MD, [facility] 06/04/2021 9:57 AM    [facility] at [facility] [address] [place], [place], [place] Phone: [phone]; Fax: [phone]

## 2021-06-04 ENCOUNTER — Other Ambulatory Visit: Payer: Self-pay | Admitting: Cardiology

## 2021-06-04 ENCOUNTER — Ambulatory Visit: Payer: PRIVATE HEALTH INSURANCE | Admitting: Cardiology

## 2021-06-04 ENCOUNTER — Encounter: Payer: Self-pay | Admitting: Cardiology

## 2021-06-04 ENCOUNTER — Ambulatory Visit (INDEPENDENT_AMBULATORY_CARE_PROVIDER_SITE_OTHER): Payer: PRIVATE HEALTH INSURANCE

## 2021-06-04 VITALS — BP 130/82 | HR 82 | Ht 67.0 in | Wt 211.0 lb

## 2021-06-04 DIAGNOSIS — Z23 Encounter for immunization: Secondary | ICD-10-CM | POA: Diagnosis not present

## 2021-06-04 DIAGNOSIS — R002 Palpitations: Secondary | ICD-10-CM

## 2021-06-04 DIAGNOSIS — Z87898 Personal history of other specified conditions: Secondary | ICD-10-CM | POA: Diagnosis not present

## 2021-06-04 DIAGNOSIS — I493 Ventricular premature depolarization: Secondary | ICD-10-CM

## 2021-06-04 DIAGNOSIS — E782 Mixed hyperlipidemia: Secondary | ICD-10-CM

## 2021-06-04 NOTE — Patient Instructions (Addendum)
Medication Instructions:  ?Your physician recommends that you continue on your current medications as directed. Please refer to the Current Medication list given to you today. ? ?Labwork: ?none ? ?Testing/Procedures: ?ZIO- Long Term Monitor Instructions  ? ?Your physician has requested you wear your ZIO patch monitor 3 days.  ? ?This is a single patch monitor.  [employer] supplies one patch monitor per enrollment.  Additional stickers are not available. ?  ?Please do not apply patch if you will be having a Nuclear Stress Test, Echocardiogram, Cardiac CT, MRI, or Chest Xray during the time frame you would be wearing the monitor. The patch cannot be worn during these tests.  You cannot remove and re-apply the ZIO XT patch monitor. ?  ?  ?Once you have received you monitor, please review enclosed instructions.  Your monitor has already been registered assigning a specific monitor serial # to you. ? ? ?Applying the monitor  ? ?Shave hair from upper left chest. ?  ?Hold abrader disc by orange tab.  Rub abrader in 40 strokes over left upper chest as indicated in your monitor instructions. ?  ?Clean area with 4 enclosed alcohol pads .  Use all pads to assure are is cleaned thoroughly.  Let dry.  ? ?Apply patch as indicated in monitor instructions.  Patch will be place under collarbone on left side of chest with arrow pointing upward. ?  ?Rub patch adhesive wings for 2 minutes.Remove white label marked "1".  Remove white label marked "2".  Rub patch adhesive wings for 2 additional minutes. ?  ?While looking in a mirror, press and release button in center of patch.  A small green light will flash 3-4 times .  This will be your only indicator the monitor has been turned on. ?    ?Do not shower for the first 24 hours.  You may shower after the first 24 hours. ?  ?Press button if you feel a symptom. You will hear a small click.  Record Date, Time and Symptom in the Patient Log Book. ?  ?When you are ready to remove patch, follow  instructions on last 2 pages of Patient Log Book.  Stick patch monitor onto last page of Patient Log Book. ?  ?Place Patient Log Book in Blue box.  Use locking tab on box and tape box closed securely.  The Orange and White box has prepaid postage on it.  Please place in mailbox as soon as possible.  Your physician should have your test results approximately 7 days after the monitor has been mailed back to [employer]. ?  ?Call [employer] Customer Care at [phone] if you have questions regarding your ZIO XT patch monitor.  Call them immediately if you see an orange light blinking on your monitor. ?  ?If your monitor falls off in less than 4 days contact our Monitor department at [phone].  If your monitor becomes loose or falls off after 4 days call [employer] at [phone] for suggestions on securing your monitor. ? ?Follow-Up: ?Your physician recommends that you schedule a follow-up appointment in: pending ? ?Any Other Special Instructions Will Be Listed Below (If Applicable). ? ?If you need a refill on your cardiac medications before your next appointment, please call your pharmacy. ?

## 2021-06-19 ENCOUNTER — Telehealth: Payer: Self-pay | Admitting: *Deleted

## 2021-06-19 NOTE — Telephone Encounter (Signed)
Patient informed and verbalized understanding of plan. Will call office if she wants to take toprol xl

## 2021-06-19 NOTE — Telephone Encounter (Signed)
-----   Message from Jonelle Sidle, MD sent at 06/17/2021  2:44 PM EDT ----- Results reviewed.  Cardiac monitor shows normal rhythm throughout.  There were rare PACs and at this point occasional PVCs representing 1.9% total beats.  She may be feeling these as sense of palpitations.  Importantly, no sustained arrhythmias however.  We do not necessarily need to treat her PVCs unless she is significantly bothered by palpitations in which case we might consider trial of low-dose beta-blocker such as Toprol-XL 12.5 mg daily.

## 2021-11-01 ENCOUNTER — Telehealth: Payer: Self-pay | Admitting: Family Medicine

## 2021-11-01 DIAGNOSIS — R5383 Other fatigue: Secondary | ICD-10-CM

## 2021-11-01 DIAGNOSIS — E7800 Pure hypercholesterolemia, unspecified: Secondary | ICD-10-CM

## 2021-11-01 DIAGNOSIS — Z131 Encounter for screening for diabetes mellitus: Secondary | ICD-10-CM

## 2021-11-01 NOTE — Telephone Encounter (Signed)
Last labs 10/2020: Lipid, CMP, CBC, TSH ?

## 2021-11-01 NOTE — Telephone Encounter (Signed)
Patient is scheduled for a physical on 12/02/21 is requesting blood work prior to appt. ? ?CB# [phone] ?

## 2021-11-04 NOTE — Telephone Encounter (Signed)
Brandy Sams, DO   ? ?Please order CBC, CMP w/GFR, Lipid panel.  ? ?Thank you  ? ?Dr. Adriana Simas  ? ?

## 2021-11-04 NOTE — Telephone Encounter (Signed)
Blood work ordered in Epic. Patient notified. 

## 2021-11-23 LAB — LIPID PANEL
Chol/HDL Ratio: 4.7 ratio — ABNORMAL HIGH (ref 0.0–4.4)
Cholesterol, Total: 216 mg/dL — ABNORMAL HIGH (ref 100–199)
HDL: 46 mg/dL (ref >39)
LDL Chol Calc (NIH): 146 mg/dL — ABNORMAL HIGH (ref 0–99)
Triglycerides: 133 mg/dL (ref 0–149)
VLDL Cholesterol Cal: 24 mg/dL (ref 5–40)

## 2021-11-23 LAB — CBC WITH DIFFERENTIAL/PLATELET
Basophils Absolute: 0 10*3/uL (ref 0.0–0.2)
Basos: 0 %
EOS (ABSOLUTE): 0.2 10*3/uL (ref 0.0–0.4)
Eos: 3 %
Hematocrit: 44 % (ref 34.0–46.6)
Hemoglobin: 14.8 g/dL (ref 11.1–15.9)
Immature Grans (Abs): 0 10*3/uL (ref 0.0–0.1)
Immature Granulocytes: 1 %
Lymphocytes Absolute: 2.8 10*3/uL (ref 0.7–3.1)
Lymphs: 39 %
MCH: 31.8 pg (ref 26.6–33.0)
MCHC: 33.6 g/dL (ref 31.5–35.7)
MCV: 95 fL (ref 79–97)
Monocytes Absolute: 0.5 10*3/uL (ref 0.1–0.9)
Monocytes: 7 %
Neutrophils Absolute: 3.6 10*3/uL (ref 1.4–7.0)
Neutrophils: 50 %
Platelets: 175 10*3/uL (ref 150–450)
RBC: 4.65 x10E6/uL (ref 3.77–5.28)
RDW: 12.7 % (ref 11.7–15.4)
WBC: 7.2 10*3/uL (ref 3.4–10.8)

## 2021-11-23 LAB — COMPREHENSIVE METABOLIC PANEL
ALT: 9 IU/L (ref 0–32)
AST: 15 IU/L (ref 0–40)
Albumin/Globulin Ratio: 2.2 (ref 1.2–2.2)
Albumin: 4.4 g/dL (ref 3.8–4.8)
Alkaline Phosphatase: 82 IU/L (ref 44–121)
BUN/Creatinine Ratio: 8 — ABNORMAL LOW (ref 9–23)
BUN: 7 mg/dL (ref 6–24)
Bilirubin Total: 0.6 mg/dL (ref 0.0–1.2)
CO2: 25 mmol/L (ref 20–29)
Calcium: 9.5 mg/dL (ref 8.7–10.2)
Chloride: 104 mmol/L (ref 96–106)
Creatinine, Ser: 0.88 mg/dL (ref 0.57–1.00)
Globulin, Total: 2 g/dL (ref 1.5–4.5)
Glucose: 85 mg/dL (ref 70–99)
Potassium: 3.9 mmol/L (ref 3.5–5.2)
Sodium: 141 mmol/L (ref 134–144)
Total Protein: 6.4 g/dL (ref 6.0–8.5)
eGFR: 81 mL/min/{1.73_m2} (ref >59)

## 2021-12-02 ENCOUNTER — Ambulatory Visit (INDEPENDENT_AMBULATORY_CARE_PROVIDER_SITE_OTHER): Payer: 59 | Admitting: Family Medicine

## 2021-12-02 DIAGNOSIS — R159 Full incontinence of feces: Secondary | ICD-10-CM | POA: Insufficient documentation

## 2021-12-02 DIAGNOSIS — Z0000 Encounter for general adult medical examination without abnormal findings: Secondary | ICD-10-CM | POA: Diagnosis not present

## 2021-12-02 NOTE — Assessment & Plan Note (Signed)
Discussed referral to GI with patient.  She is unsure of how she wants to proceed.  She will think about this. ?

## 2021-12-02 NOTE — Progress Notes (Signed)
? ?Subjective:  ?Patient ID: Brandy Hensley, female    DOB: 1971-01-07  Age: 51 y.o. MRN: [id number] ? ?CC: ?Chief Complaint  ?Patient presents with  ? annual wellness exam  ? ? ?HPI: ? ?51 year old female with history of tobacco abuse and COPD as well as IBS and hyperlipidemia presents for an annual physical exam. ? ?Preventative Healthcare ?Pap smear: Up to date. ?Mammogram: Up to date. ?Colonoscopy: Due. ?Immunizations ?Tetanus - Unsure of last tetanus. ?Pneumococcal - Candidate for pneumococcal vaccine. ?Flu -up-to-date. ?Hepatitis C screening -declines. ?Labs: Recently had labs.  Will review today. ?Smoking/tobacco use: Current smoker. ?STD/HIV testing: Declines. ? ?Patient states that she is trying to work on smoking cessation. ? ?Patient states that she has been having fecal incontinence.  She would like to discuss this today.  She states that she has been having fecal incontinence for the past 2 years.  Has been worsening as of late.  He states that she has times where it occurs after she has a bowel movement and also times where she does not know she is having incontinence.  She states that she has issues with IBS.  Denies hematochezia or melena.  She states that she is unsure whether she wants to do anything about this. ? ?Patient Active Problem List  ? Diagnosis Date Noted  ? Annual physical exam 12/02/2021  ? Fecal incontinence 12/02/2021  ? DOE (dyspnea on exertion) 12/22/2018  ? COPD GOLD II (barely)  but MZ/ smoker 12/22/2018  ? Cigarette smoker 12/22/2018  ? Perimenopause 02/26/2017  ? Irritable bowel syndrome with diarrhea 01/15/2017  ? Hyperlipidemia 07/05/2015  ? GERD (gastroesophageal reflux disease) 10/31/2013  ? ? ?Social Hx   ?Social History  ? ?Socioeconomic History  ? Marital status: Divorced  ?  Spouse name: Not on file  ? Number of children: Not on file  ? Years of education: Not on file  ? Highest education level: Not on file  ?Occupational History  ? Not on file  ?Tobacco Use  ? Smoking  status: Every Day  ?  Packs/day: 1.00  ?  Years: 31.00  ?  Pack years: 31.00  ?  Types: Cigarettes  ? Smokeless tobacco: Never  ?Vaping Use  ? Vaping Use: Never used  ?Substance and Sexual Activity  ? Alcohol use: No  ?  Alcohol/week: 0.0 standard drinks  ? Drug use: No  ? Sexual activity: Yes  ?  Birth control/protection: Surgical  ?Other Topics Concern  ? Not on file  ?Social History Narrative  ? Not on file  ? ?Social Determinants of Health  ? ?Financial Resource Strain: Not on file  ?Food Insecurity: Not on file  ?Transportation Needs: Not on file  ?Physical Activity: Not on file  ?Stress: Not on file  ?Social Connections: Not on file  ? ? ?Review of Systems  ?Constitutional: Negative.   ?Respiratory:  Positive for shortness of breath.   ?Gastrointestinal:   ?     Fecal incontinence.  ? ?Objective:  ?BP 116/70   Pulse 77   Temp 98.5 ?F (36.9 ?C) (Oral)   Ht 5\' 7"  (1.702 m)   Wt 214 lb (97.1 kg)   SpO2 100%   BMI 33.52 kg/m?  ? ? ?  12/02/2021  ?  1:45 PM 06/04/2021  ?  9:41 AM 05/31/2021  ?  9:04 AM  ?BP/Weight  ?Systolic BP 116 130 121  ?Diastolic BP 70 82 68  ?Wt. (Lbs) 214 211   ?BMI 33.52 kg/m2 33.05 kg/m2   ? ? ?  Physical Exam ?Vitals and nursing note reviewed.  ?Constitutional:   ?   General: She is not in acute distress. ?   Appearance: Normal appearance. She is not ill-appearing.  ?HENT:  ?   Head: Normocephalic and atraumatic.  ?Eyes:  ?   General:     ?   Right eye: No discharge.     ?   Left eye: No discharge.  ?   Conjunctiva/sclera: Conjunctivae normal.  ?Cardiovascular:  ?   Rate and Rhythm: Normal rate and regular rhythm.  ?Pulmonary:  ?   Effort: Pulmonary effort is normal.  ?   Breath sounds: Normal breath sounds. No wheezing, rhonchi or rales.  ?Abdominal:  ?   General: There is no distension.  ?   Palpations: Abdomen is soft.  ?   Tenderness: There is no abdominal tenderness.  ?Neurological:  ?   Mental Status: She is alert.  ?Psychiatric:     ?   Mood and Affect: Mood normal.     ?    Behavior: Behavior normal.  ? ? ?Lab Results  ?Component Value Date  ? WBC 7.2 11/22/2021  ? HGB 14.8 11/22/2021  ? HCT 44.0 11/22/2021  ? PLT 175 11/22/2021  ? GLUCOSE 85 11/22/2021  ? CHOL 216 (H) 11/22/2021  ? TRIG 133 11/22/2021  ? HDL 46 11/22/2021  ? LDLCALC 146 (H) 11/22/2021  ? ALT 9 11/22/2021  ? AST 15 11/22/2021  ? NA 141 11/22/2021  ? K 3.9 11/22/2021  ? CL 104 11/22/2021  ? CREATININE 0.88 11/22/2021  ? BUN 7 11/22/2021  ? CO2 25 11/22/2021  ? TSH 1.250 11/22/2020  ? ? ? ?Assessment & Plan:  ? ?Problem List Items Addressed This Visit   ? ?  ? Other  ? Annual physical exam  ?  Recent labs reviewed with the patient. ?We discussed smoking cessation.  She states that she will work on this.  We discussed pharmacological measures and she states that she will think that this. ?Due for colonoscopy.  This was postponed in health maintenance section by previous provider. ?  ?  ? Fecal incontinence  ?  Discussed referral to GI with patient.  She is unsure of how she wants to proceed.  She will think about this. ?  ?  ? ?Follow-up:  Return in about 1 year (around 12/03/2022). ? ?Everlene Other DO ?[facility] ? ?

## 2021-12-02 NOTE — Patient Instructions (Signed)
Keep working on smoking cessation. ? ?If you would like me to refer you to GI, please let me know. ? ?Follow up annually. ? ?Take care ? ?Dr. Adriana Simas  ?

## 2021-12-02 NOTE — Assessment & Plan Note (Signed)
Recent labs reviewed with the patient. ?We discussed smoking cessation.  She states that she will work on this.  We discussed pharmacological measures and she states that she will think that this. ?Due for colonoscopy.  This was postponed in health maintenance section by previous provider. ?

## 2022-02-09 ENCOUNTER — Other Ambulatory Visit: Payer: Self-pay

## 2022-02-09 ENCOUNTER — Ambulatory Visit
Admission: EM | Admit: 2022-02-09 | Discharge: 2022-02-09 | Disposition: A | Discharge status: Home / Self Care | Payer: 59 | Attending: Student | Admitting: Student

## 2022-02-09 ENCOUNTER — Encounter: Payer: Self-pay | Admitting: Emergency Medicine

## 2022-02-09 DIAGNOSIS — J324 Chronic pansinusitis: Secondary | ICD-10-CM

## 2022-02-09 DIAGNOSIS — J441 Chronic obstructive pulmonary disease with (acute) exacerbation: Secondary | ICD-10-CM

## 2022-02-09 DIAGNOSIS — J0110 Acute frontal sinusitis, unspecified: Secondary | ICD-10-CM

## 2022-02-09 MED ORDER — FLUCONAZOLE 150 MG PO TABS: 150.0000 mg | ORAL_TABLET | Freq: Every day | ORAL | 0 refills | Status: DC

## 2022-02-09 MED ORDER — ONDANSETRON 4 MG PO TBDP: 4.0000 mg | ORAL_TABLET | Freq: Three times a day (TID) | ORAL | 0 refills | Status: DC | PRN

## 2022-02-09 MED ORDER — DOXYCYCLINE HYCLATE 100 MG PO CAPS: 100.0000 mg | ORAL_CAPSULE | Freq: Two times a day (BID) | ORAL | 0 refills | Status: DC

## 2022-02-09 NOTE — ED Provider Notes (Addendum)
[facility]    CSN: [id number] Arrival date & time: 02/09/22  0858      History   Chief Complaint Chief Complaint  Patient presents with   Nasal Congestion    HPI Brandy Hensley is a 51 y.o. female presenting with nasal congestion, facial pressure, sore throat, cough.  History COPD, current cigarette smoker.  She describes a viral syndrome that seem to be improving on its own until 2 days ago when it got worse again.  Now with purulent nasal drainage, pressure behind the eyes and the forehead, cough that is productive of yellow sputum.  Denies fevers, chest pain, new back or flank pain.  Using her albuterol inhaler several times a week but not daily, she does have improvement in the cough when she uses this.  HPI  Past Medical History:  Diagnosis Date   COPD (chronic obstructive pulmonary disease) (HCC)    Diverticulitis    GERD (gastroesophageal reflux disease)    PONV (postoperative nausea and vomiting)     Patient Active Problem List   Diagnosis Date Noted   Annual physical exam 12/02/2021   Fecal incontinence 12/02/2021   DOE (dyspnea on exertion) 12/22/2018   COPD GOLD II (barely)  but MZ/ smoker 12/22/2018   Cigarette smoker 12/22/2018   Perimenopause 02/26/2017   Irritable bowel syndrome with diarrhea 01/15/2017   Hyperlipidemia 07/05/2015   GERD (gastroesophageal reflux disease) 10/31/2013    Past Surgical History:  Procedure Laterality Date   CHOLECYSTECTOMY  09/10/2012   Procedure: LAPAROSCOPIC CHOLECYSTECTOMY;  Surgeon: Dalia Heading, MD;  Location: [facility];  Service: General;  Laterality: N/A;   COLONOSCOPY  2005-2006   Dr.Rehman @[facility]    TUBAL LIGATION      OB History   No obstetric history on file.      Home Medications    Prior to Admission medications   Medication Sig Start Date End Date Taking? Authorizing Provider  doxycycline (VIBRAMYCIN) 100 MG capsule Take 1 capsule (100 mg total) by mouth 2 (two) times daily for 7 days. 02/09/22  02/16/22 Yes 02/18/22, PA-C  fluconazole (DIFLUCAN) 150 MG tablet Take 1 tablet (150 mg total) by mouth daily. -For your yeast infection, start the Diflucan (fluconazole)- Take one pill today (day 1). If you're still having symptoms in 3 days, take the second pill. 02/09/22  Yes 02/11/22, PA-C  ondansetron (ZOFRAN-ODT) 4 MG disintegrating tablet Take 1 tablet (4 mg total) by mouth every 8 (eight) hours as needed for nausea or vomiting. 02/09/22  Yes 02/11/22, PA-C  acetaminophen (TYLENOL) 325 MG tablet Take 650 mg by mouth every 6 (six) hours as needed.    [provider]  albuterol (PROVENTIL) (2.5 MG/3ML) 0.083% nebulizer solution Take 3 mLs (2.5 mg total) by nebulization every 6 (six) hours as needed for wheezing or shortness of breath. 12/10/20   Rodriguez-Southworth, 12/12/20, PA-C  albuterol (VENTOLIN HFA) 108 (90 Base) MCG/ACT inhaler Inhale 2 puffs into the lungs every 6 (six) hours as needed for wheezing or shortness of breath. 07/29/19   14/4/20, MD  cyclobenzaprine (FLEXERIL) 10 MG tablet TAKE 1 TABLET BY MOUTH 3 TIMES A DAY AS NEEDED FOR MUSCLE SPASMS 07/20/18   07/22/18, MD  Famotidine (PEPCID PO) Take 10 mg by mouth daily as needed (Acid after eating dinner).    [provider]  ibuprofen (ADVIL) 200 MG tablet Take 200 mg by mouth every 6 (six) hours as needed.  [provider]  meclizine (ANTIVERT) 25 MG tablet Take 1 tablet (25 mg total) by mouth 3 (three) times daily as needed for dizziness. 11/09/20   Ladona Ridgel, Malena M, DO  buPROPion (WELLBUTRIN SR) 150 MG 12 hr tablet TAKE 1 BY MOUTH EVERY MORNING FOR 3 DAYS, THEN 1 TABLET TWICE A DAY Patient not taking: No sig reported 03/22/19 10/16/19  Merlyn Albert, MD  pantoprazole (PROTONIX) 40 MG tablet Take 30-60 min before first meal of the day Patient not taking: No sig reported 02/15/19 10/16/19  Nyoka Cowden, MD    Family History Family History  Problem Relation Age of Onset    Hypertension Mother    Osteoporosis Mother    COPD Mother        smoked   Heart attack Maternal Grandmother    Cancer Maternal Grandmother 59       pancreatic cancer   Heart disease Maternal Grandmother 21       MI/CABG   Emphysema Father        smoked   Lung cancer Father     Social History Social History   Tobacco Use   Smoking status: Every Day    Packs/day: 1.00    Years: 31.00    Total pack years: 31.00    Types: Cigarettes   Smokeless tobacco: Never  Vaping Use   Vaping Use: Never used  Substance Use Topics   Alcohol use: No    Alcohol/week: 0.0 standard drinks of alcohol   Drug use: No     Allergies   Codeine, Flovent diskus [fluticasone furoate], Augmentin [amoxicillin-pot clavulanate], Contrave [naltrexone-bupropion hcl er], and Qsymia [phentermine-topiramate]   Review of Systems Review of Systems  Constitutional:  Negative for appetite change, chills and fever.  HENT:  Positive for congestion and sinus pressure. Negative for ear pain, rhinorrhea, sinus pain and sore throat.   Eyes:  Negative for redness and visual disturbance.  Respiratory:  Positive for cough. Negative for chest tightness, shortness of breath and wheezing.   Cardiovascular:  Negative for chest pain and palpitations.  Gastrointestinal:  Negative for abdominal pain, constipation, diarrhea, nausea and vomiting.  Genitourinary:  Negative for dysuria, frequency and urgency.  Musculoskeletal:  Negative for myalgias.  Neurological:  Negative for dizziness, weakness and headaches.  Psychiatric/Behavioral:  Negative for confusion.   All other systems reviewed and are negative.    Physical Exam Triage Vital Signs ED Triage Vitals [02/09/22 0933]  Enc Vitals Group     BP 130/83     Pulse Rate 92     Resp 18     Temp 98 F (36.7 C)     Temp Source Oral     SpO2 95 %     Weight      Height      Head Circumference      Peak Flow      Pain Score 5     Pain Loc      Pain Edu?       Excl. in GC?    No data found.  Updated Vital Signs BP 130/83 (BP Location: Right Arm)   Pulse 92   Temp 98 F (36.7 C) (Oral)   Resp 18   SpO2 95%   Visual Acuity Right Eye Distance:   Left Eye Distance:   Bilateral Distance:    Right Eye Near:   Left Eye Near:    Bilateral Near:     Physical Exam Vitals reviewed.  Constitutional:  General: She is not in acute distress.    Appearance: Normal appearance. She is not ill-appearing.  HENT:     Head: Normocephalic and atraumatic.     Right Ear: Tympanic membrane, ear canal and external ear normal. No tenderness. No middle ear effusion. There is no impacted cerumen. Tympanic membrane is not perforated, erythematous, retracted or bulging.     Left Ear: Tympanic membrane, ear canal and external ear normal. No tenderness.  No middle ear effusion. There is no impacted cerumen. Tympanic membrane is not perforated, erythematous, retracted or bulging.     Nose: No congestion.     Right Sinus: Maxillary sinus tenderness and frontal sinus tenderness present.     Left Sinus: Maxillary sinus tenderness and frontal sinus tenderness present.     Mouth/Throat:     Mouth: Mucous membranes are moist.     Pharynx: Uvula midline. No oropharyngeal exudate or posterior oropharyngeal erythema.  Eyes:     Extraocular Movements: Extraocular movements intact.     Pupils: Pupils are equal, round, and reactive to light.  Cardiovascular:     Rate and Rhythm: Normal rate and regular rhythm.     Heart sounds: Normal heart sounds.  Pulmonary:     Effort: Pulmonary effort is normal.     Breath sounds: Normal breath sounds. No decreased breath sounds, wheezing, rhonchi or rales.  Abdominal:     Palpations: Abdomen is soft.     Tenderness: There is no abdominal tenderness. There is no guarding or rebound.  Lymphadenopathy:     Cervical: No cervical adenopathy.     Right cervical: No superficial cervical adenopathy.    Left cervical: No superficial  cervical adenopathy.  Neurological:     General: No focal deficit present.     Mental Status: She is alert and oriented to person, place, and time.  Psychiatric:        Mood and Affect: Mood normal.        Behavior: Behavior normal.        Thought Content: Thought content normal.        Judgment: Judgment normal.      UC Treatments / Results  Labs (all labs ordered are listed, but only abnormal results are displayed) Labs Reviewed - No data to display  EKG   Radiology No results found.  Procedures Procedures (including critical care time)  Medications Ordered in UC Medications - No data to display  Initial Impression / Assessment and Plan / UC Course  I have reviewed the triage vital signs and the nursing notes.  Pertinent labs & imaging results that were available during my care of the patient were reviewed by me and considered in my medical decision making (see chart for details).     This patient is a very pleasant 51 y.o. year old female presenting with sinusitis following viral syndrome. Afebrile, nontachy.  I also have concern for COPD exacerbation given cough that is increasingly productive of purulent sputum.  Will manage with doxycycline to cover for both complaints, as she is penicillin allergic.  Also sent Diflucan for yeast prophylaxis.  Patient states she sometimes gets nausea with antibiotics, so Zofran sent to have on hand. ED return precautions discussed. Patient verbalizes understanding and agreement.   Final Clinical Impressions(s) / UC Diagnoses   Final diagnoses:  Acute non-recurrent frontal sinusitis  COPD exacerbation (HCC)     Discharge Instructions      -Doxycycline twice daily for 7 days.  Make sure to wear  sunscreen while spending time outside while on this medication as it can increase your chance of sunburn. You can take this medication with food if you have a sensitive stomach. -To prevent or treat yeast infection - diflucan  (fluconazole) -If the antibiotic makes you feel nauseous - Take the Zofran (ondansetron) up to 3 times daily for nausea and vomiting. Dissolve one pill under your tongue or between your teeth and your cheek. -Albuterol inhaler that you have at home already as needed for cough, wheezing, shortness of breath, 1 to 2 puffs every 6 hours as needed. -Follow-up if symptoms worsen instead of improve      ED Prescriptions     Medication Sig Dispense Auth. Provider   doxycycline (VIBRAMYCIN) 100 MG capsule Take 1 capsule (100 mg total) by mouth 2 (two) times daily for 7 days. 14 capsule Rhys Martini, PA-C   fluconazole (DIFLUCAN) 150 MG tablet Take 1 tablet (150 mg total) by mouth daily. -For your yeast infection, start the Diflucan (fluconazole)- Take one pill today (day 1). If you're still having symptoms in 3 days, take the second pill. 2 tablet Rhys Martini, PA-C   ondansetron (ZOFRAN-ODT) 4 MG disintegrating tablet Take 1 tablet (4 mg total) by mouth every 8 (eight) hours as needed for nausea or vomiting. 14 tablet Rhys Martini, PA-C      PDMP not reviewed this encounter.   Rhys Martini, PA-C 02/09/22 1051    Rhys Martini, PA-C 02/09/22 1051

## 2022-02-09 NOTE — Discharge Instructions (Addendum)
-  Doxycycline twice daily for 7 days.  Make sure to wear sunscreen while spending time outside while on this medication as it can increase your chance of sunburn. You can take this medication with food if you have a sensitive stomach. -To prevent or treat yeast infection - diflucan (fluconazole) -If the antibiotic makes you feel nauseous - Take the Zofran (ondansetron) up to 3 times daily for nausea and vomiting. Dissolve one pill under your tongue or between your teeth and your cheek. -Albuterol inhaler that you have at home already as needed for cough, wheezing, shortness of breath, 1 to 2 puffs every 6 hours as needed. -Follow-up if symptoms worsen instead of improve

## 2022-02-09 NOTE — ED Triage Notes (Signed)
Pt reports nasal congestion, facial pressure, sore throat, cough that "might be going into my chest" since last week.

## 2022-02-13 ENCOUNTER — Ambulatory Visit (HOSPITAL_COMMUNITY)
Admission: RE | Admit: 2022-02-13 | Discharge: 2022-02-13 | Disposition: A | Discharge status: Home / Self Care | Payer: 59 | Source: Ambulatory Visit | Attending: Family Medicine | Admitting: Family Medicine

## 2022-02-13 ENCOUNTER — Ambulatory Visit (INDEPENDENT_AMBULATORY_CARE_PROVIDER_SITE_OTHER): Payer: 59 | Admitting: Family Medicine

## 2022-02-13 VITALS — BP 108/73 | HR 86 | Temp 98.1°F | Ht 67.0 in | Wt 218.0 lb

## 2022-02-13 DIAGNOSIS — M5412 Radiculopathy, cervical region: Secondary | ICD-10-CM

## 2022-02-13 MED ORDER — CYCLOBENZAPRINE HCL 10 MG PO TABS: 10.0000 mg | ORAL_TABLET | Freq: Three times a day (TID) | ORAL | 1 refills | Status: AC | PRN

## 2022-02-13 MED ORDER — PREDNISONE 10 MG PO TABS: ORAL_TABLET | ORAL | 0 refills | Status: DC

## 2022-02-13 NOTE — Assessment & Plan Note (Signed)
X-ray was obtained today was independent reviewed by me.  Interpretation: Spondylosis noted at C6-C7.  X-ray otherwise unremarkable.  Placing on prednisone.  Flexeril as needed.  If continues to persist will proceed with MRI imaging.

## 2022-02-13 NOTE — Patient Instructions (Addendum)
Medication as prescribed.  Xray today.  We will call with the results.  Take care  Dr. Adriana Simas

## 2022-02-13 NOTE — Progress Notes (Signed)
Subjective:  Patient ID: Brandy Hensley, female    DOB: 1970/09/10  Age: 51 y.o. MRN: [id number]  CC: Chief Complaint  Patient presents with   right side neck pain and discomfort    Going on for a couple of months - pain radiates down to r shoulder    HPI:  51 year old female presents for evaluation of the above.  Patient reports a 77-month history of right-sided neck pain.  She states that she has radiation down the right arm and also associated tingling.  She has been taking ibuprofen with some improvement but no resolution.  No fall, trauma, injury.  She does note that she was in bed 1 night and felt a pop and thinks this may have been the inciting factor.  No other associated symptoms.  No other complaints.  Patient Active Problem List   Diagnosis Date Noted   Cervical radiculopathy 02/13/2022   Annual physical exam 12/02/2021   Fecal incontinence 12/02/2021   DOE (dyspnea on exertion) 12/22/2018   COPD GOLD II (barely)  but MZ/ smoker 12/22/2018   Irritable bowel syndrome with diarrhea 01/15/2017   Hyperlipidemia 07/05/2015   GERD (gastroesophageal reflux disease) 10/31/2013    Social Hx   Social History   Socioeconomic History   Marital status: Divorced    Spouse name: Not on file   Number of children: Not on file   Years of education: Not on file   Highest education level: Not on file  Occupational History   Not on file  Tobacco Use   Smoking status: Every Day    Packs/day: 1.00    Years: 31.00    Total pack years: 31.00    Types: Cigarettes   Smokeless tobacco: Never  Vaping Use   Vaping Use: Never used  Substance and Sexual Activity   Alcohol use: No    Alcohol/week: 0.0 standard drinks of alcohol   Drug use: No   Sexual activity: Yes    Birth control/protection: Surgical  Other Topics Concern   Not on file  Social History Narrative   Not on file   Social Determinants of Health   Financial Resource Strain: Not on file  Food Insecurity: Not on file   Transportation Needs: Not on file  Physical Activity: Not on file  Stress: Not on file  Social Connections: Not on file    Review of Systems Per HPI  Objective:  BP 108/73   Pulse 86   Temp 98.1 F (36.7 C)   Ht 5\' 7"  (1.702 m)   Wt 218 lb (98.9 kg)   SpO2 97%   BMI 34.14 kg/m      02/13/2022    9:34 AM 02/09/2022    9:33 AM 12/02/2021    1:45 PM  BP/Weight  Systolic BP 108 130 116  Diastolic BP 73 83 70  Wt. (Lbs) 218  214  BMI 34.14 kg/m2  33.52 kg/m2    Physical Exam Vitals and nursing note reviewed.  Constitutional:      General: She is not in acute distress.    Appearance: Normal appearance.  HENT:     Head: Normocephalic and atraumatic.  Cardiovascular:     Rate and Rhythm: Normal rate and regular rhythm.  Pulmonary:     Effort: Pulmonary effort is normal.     Breath sounds: Normal breath sounds. No wheezing or rales.  Musculoskeletal:     Cervical back: Normal range of motion and neck supple.  Neurological:  Mental Status: She is alert.  Psychiatric:        Mood and Affect: Mood normal.        Behavior: Behavior normal.     Lab Results  Component Value Date   WBC 7.2 11/22/2021   HGB 14.8 11/22/2021   HCT 44.0 11/22/2021   PLT 175 11/22/2021   GLUCOSE 85 11/22/2021   CHOL 216 (H) 11/22/2021   TRIG 133 11/22/2021   HDL 46 11/22/2021   LDLCALC 146 (H) 11/22/2021   ALT 9 11/22/2021   AST 15 11/22/2021   NA 141 11/22/2021   K 3.9 11/22/2021   CL 104 11/22/2021   CREATININE 0.88 11/22/2021   BUN 7 11/22/2021   CO2 25 11/22/2021   TSH 1.250 11/22/2020     Assessment & Plan:   Problem List Items Addressed This Visit       Nervous and Auditory   Cervical radiculopathy - Primary    X-ray was obtained today was independent reviewed by me.  Interpretation: Spondylosis noted at C6-C7.  X-ray otherwise unremarkable.  Placing on prednisone.  Flexeril as needed.  If continues to persist will proceed with MRI imaging.      Relevant  Medications   cyclobenzaprine (FLEXERIL) 10 MG tablet   Other Relevant Orders   DG Cervical Spine Complete (Completed)    Meds ordered this encounter  Medications   cyclobenzaprine (FLEXERIL) 10 MG tablet    Sig: Take 1 tablet (10 mg total) by mouth 3 (three) times daily as needed for muscle spasms.    Dispense:  30 tablet    Refill:  1   predniSONE (DELTASONE) 10 MG tablet    Sig: 50 mg daily x 2 days, then 40 mg daily x 2 days, then 30 mg daily x 2 days, then 20 mg daily x 2 days, then 10 mg daily x 2 days.    Dispense:  30 tablet    Refill:  0    Genessis Flanary DO [facility]

## 2022-07-06 ENCOUNTER — Encounter (INDEPENDENT_AMBULATORY_CARE_PROVIDER_SITE_OTHER): Payer: Self-pay | Admitting: Gastroenterology

## 2022-09-12 ENCOUNTER — Telehealth: Payer: Self-pay | Admitting: Emergency Medicine

## 2022-09-12 ENCOUNTER — Telehealth: Payer: Self-pay | Admitting: Nurse Practitioner

## 2022-09-12 ENCOUNTER — Telehealth: Payer: Self-pay

## 2022-09-12 ENCOUNTER — Ambulatory Visit
Admission: RE | Admit: 2022-09-12 | Discharge: 2022-09-12 | Disposition: A | Discharge status: Home / Self Care | Payer: 59 | Source: Ambulatory Visit | Attending: Nurse Practitioner | Admitting: Nurse Practitioner

## 2022-09-12 VITALS — BP 118/80 | HR 85 | Temp 98.1°F | Resp 17

## 2022-09-12 DIAGNOSIS — N3001 Acute cystitis with hematuria: Secondary | ICD-10-CM | POA: Insufficient documentation

## 2022-09-12 LAB — POCT URINALYSIS DIP (MANUAL ENTRY)
Bilirubin, UA: NEGATIVE
Glucose, UA: NEGATIVE mg/dL
Ketones, POC UA: NEGATIVE mg/dL
Nitrite, UA: POSITIVE — AB
Protein Ur, POC: NEGATIVE mg/dL
Spec Grav, UA: <=1.005 — AB (ref 1.010–1.025)
Urobilinogen, UA: 0.2 E.U./dL
pH, UA: 5.5 (ref 5.0–8.0)

## 2022-09-12 MED ORDER — CEPHALEXIN 500 MG PO CAPS: 500.0000 mg | ORAL_CAPSULE | Freq: Four times a day (QID) | ORAL | 0 refills | Status: AC

## 2022-09-12 MED ORDER — PHENAZOPYRIDINE HCL 100 MG PO TABS: 100.0000 mg | ORAL_TABLET | Freq: Three times a day (TID) | ORAL | 0 refills | Status: DC | PRN

## 2022-09-12 MED ORDER — CEPHALEXIN 500 MG PO CAPS: 500.0000 mg | ORAL_CAPSULE | Freq: Four times a day (QID) | ORAL | 0 refills | Status: DC

## 2022-09-12 NOTE — Telephone Encounter (Signed)
Medications resent as pharmacy did not receive them initially

## 2022-09-12 NOTE — ED Provider Notes (Signed)
[facility]    CSN: [id number] Arrival date & time: 09/12/22  0948      History   Chief Complaint Chief Complaint  Patient presents with   Urinary Frequency    I suspect I have a UTIGoing to bathroom more frequently and burning sensation - Entered by patient   Appointment    1000    HPI Brandy Hensley is a 52 y.o. female.   Patient presents today for 1 week history of dysuria, urinary frequency and urgency, voiding smaller amounts, stronger urinary odor, cloudy urine, suprapubic pain/pressure.  She denies new urinary incontinence, hematuria, abdominal pain, new back pain, fever, bodyaches or chills, nausea or vomiting, and vaginal discharge.  Reports appetite has been normal.  Reports that she had a urinary tract infection in October and symptoms fully improved after that.  Reports last UTI prior to that was years ago.  Reports she "tries" to wipe from front to back, is not currently sexually active.  She was treated at an alternate urgent care for her last UTI and does not remember which antibiotic she was treated with.    Past Medical History:  Diagnosis Date   COPD (chronic obstructive pulmonary disease) ([place])    Diverticulitis    GERD (gastroesophageal reflux disease)    PONV (postoperative nausea and vomiting)     Patient Active Problem List   Diagnosis Date Noted   Cervical radiculopathy 02/13/2022   Annual physical exam 12/02/2021   Fecal incontinence 12/02/2021   DOE (dyspnea on exertion) 12/22/2018   COPD GOLD II (barely)  but MZ/ smoker 12/22/2018   Irritable bowel syndrome with diarrhea 01/15/2017   Hyperlipidemia 07/05/2015   GERD (gastroesophageal reflux disease) 10/31/2013    Past Surgical History:  Procedure Laterality Date   CHOLECYSTECTOMY  09/10/2012   Procedure: LAPAROSCOPIC CHOLECYSTECTOMY;  Surgeon: Jamesetta So, MD;  Location: [facility];  Service: General;  Laterality: N/A;   COLONOSCOPY  2005-2006   Dr.Rehman @[facility]    TUBAL LIGATION       OB History   No obstetric history on file.      Home Medications    Prior to Admission medications   Medication Sig Start Date End Date Taking? Authorizing Provider  cephALEXin (KEFLEX) 500 MG capsule Take 1 capsule (500 mg total) by mouth 4 (four) times daily for 5 days. 09/12/22 09/17/22 Yes Eulogio Bear, NP  phenazopyridine (PYRIDIUM) 100 MG tablet Take 1 tablet (100 mg total) by mouth 3 (three) times daily as needed for pain. 09/12/22  Yes Eulogio Bear, NP  acetaminophen (TYLENOL) 325 MG tablet Take 650 mg by mouth every 6 (six) hours as needed.    [provider]  albuterol (PROVENTIL) (2.5 MG/3ML) 0.083% nebulizer solution Take 3 mLs (2.5 mg total) by nebulization every 6 (six) hours as needed for wheezing or shortness of breath. 12/10/20   Rodriguez-Southworth, Sunday Spillers, PA-C  albuterol (VENTOLIN HFA) 108 (90 Base) MCG/ACT inhaler Inhale 2 puffs into the lungs every 6 (six) hours as needed for wheezing or shortness of breath. 07/29/19   Kathyrn Drown, MD  cyclobenzaprine (FLEXERIL) 10 MG tablet Take 1 tablet (10 mg total) by mouth 3 (three) times daily as needed for muscle spasms. 02/13/22   Coral Spikes, DO  Famotidine (PEPCID PO) Take 10 mg by mouth daily as needed (Acid after eating dinner).    [provider]  ibuprofen (ADVIL) 200 MG tablet Take 200 mg by mouth every 6 (six) hours as needed.  [provider]  meclizine (ANTIVERT) 25 MG tablet Take 1 tablet (25 mg total) by mouth 3 (three) times daily as needed for dizziness. 11/09/20   Ladona Ridgel, Malena M, DO  buPROPion (WELLBUTRIN SR) 150 MG 12 hr tablet TAKE 1 BY MOUTH EVERY MORNING FOR 3 DAYS, THEN 1 TABLET TWICE A DAY Patient not taking: Reported on 06/27/2019 03/22/19 10/16/19  Merlyn Albert, MD  pantoprazole (PROTONIX) 40 MG tablet Take 30-60 min before first meal of the day Patient not taking: No sig reported 02/15/19 10/16/19  Nyoka Cowden, MD    Family History Family History   Problem Relation Age of Onset   Hypertension Mother    Osteoporosis Mother    COPD Mother        smoked   Heart attack Maternal Grandmother    Cancer Maternal Grandmother 43       pancreatic cancer   Heart disease Maternal Grandmother 15       MI/CABG   Emphysema Father        smoked   Lung cancer Father     Social History Social History   Tobacco Use   Smoking status: Every Day    Packs/day: 1.00    Years: 31.00    Total pack years: 31.00    Types: Cigarettes   Smokeless tobacco: Never  Vaping Use   Vaping Use: Never used  Substance Use Topics   Alcohol use: No    Alcohol/week: 0.0 standard drinks of alcohol   Drug use: No     Allergies   Codeine, Flovent diskus [fluticasone furoate], Augmentin [amoxicillin-pot clavulanate], Contrave [naltrexone-bupropion hcl er], and Qsymia [phentermine-topiramate]   Review of Systems Review of Systems Per HPI  Physical Exam Triage Vital Signs ED Triage Vitals  Enc Vitals Group     BP 09/12/22 1016 118/80     Pulse Rate 09/12/22 1016 85     Resp 09/12/22 1016 17     Temp 09/12/22 1016 98.1 F (36.7 C)     Temp Source 09/12/22 1016 Oral     SpO2 09/12/22 1016 98 %     Weight --      Height --      Head Circumference --      Peak Flow --      Pain Score 09/12/22 1020 0     Pain Loc --      Pain Edu? --      Excl. in [place]? --    No data found.  Updated Vital Signs BP 118/80 (BP Location: Right Arm)   Pulse 85   Temp 98.1 F (36.7 C) (Oral)   Resp 17   SpO2 98%   Visual Acuity Right Eye Distance:   Left Eye Distance:   Bilateral Distance:    Right Eye Near:   Left Eye Near:    Bilateral Near:     Physical Exam Vitals and nursing note reviewed.  Constitutional:      General: She is not in acute distress.    Appearance: She is not toxic-appearing.  Pulmonary:     Effort: Pulmonary effort is normal. No respiratory distress.  Abdominal:     General: Abdomen is flat. Bowel sounds are normal. There is  no distension.     Palpations: Abdomen is soft. There is no mass.     Tenderness: There is no abdominal tenderness. There is no right CVA tenderness, left CVA tenderness or guarding.  Skin:    General: Skin is warm  and dry.     Coloration: Skin is not jaundiced or pale.     Findings: No erythema.  Neurological:     Mental Status: She is alert and oriented to person, place, and time.     Motor: No weakness.     Gait: Gait normal.  Psychiatric:        Behavior: Behavior is cooperative.      UC Treatments / Results  Labs (all labs ordered are listed, but only abnormal results are displayed) Labs Reviewed  POCT URINALYSIS DIP (MANUAL ENTRY) - Abnormal; Notable for the following components:      Result Value   Clarity, UA hazy (*)    Spec Grav, UA <=1.005 (*)    Blood, UA moderate (*)    Nitrite, UA Positive (*)    Leukocytes, UA Large (3+) (*)    All other components within normal limits  URINE CULTURE    EKG   Radiology No results found.  Procedures Procedures (including critical care time)  Medications Ordered in UC Medications - No data to display  Initial Impression / Assessment and Plan / UC Course  I have reviewed the triage vital signs and the nursing notes.  Pertinent labs & imaging results that were available during my care of the patient were reviewed by me and considered in my medical decision making (see chart for details).   Patient is well-appearing, normotensive, afebrile, not tachycardic, not tachypneic, oxygenating well on room air.    Acute cystitis with hematuria Urinalysis today is positive for nitrites Urine culture is pending Previous urine cultures reviewed and showed resistance to nitrofurantoin, ampicillin Will treat with cephalexin 4 times daily for 5 days Can also start Pyridium as needed for bladder pain Encouraged hydration plenty of water, avoidance of bladder triggers Strict ER precautions discussed with patient  The patient was  given the opportunity to ask questions.  All questions answered to their satisfaction.  The patient is in agreement to this plan.    Final Clinical Impressions(s) / UC Diagnoses   Final diagnoses:  Acute cystitis with hematuria     Discharge Instructions      I think you have a UTI today.  Take the Keflex as prescribed to treat this.  Make sure you are drinking lots of water.  You can take Pyridium every 8 hours as needed for bladder pain.  As we discussed, if your symptoms worsen, go to the ER.     ED Prescriptions     Medication Sig Dispense Auth. Provider   cephALEXin (KEFLEX) 500 MG capsule Take 1 capsule (500 mg total) by mouth 4 (four) times daily for 5 days. 20 capsule Noemi Chapel A, NP   phenazopyridine (PYRIDIUM) 100 MG tablet Take 1 tablet (100 mg total) by mouth 3 (three) times daily as needed for pain. 10 tablet Eulogio Bear, NP      PDMP not reviewed this encounter.   Eulogio Bear, NP 09/12/22 1058

## 2022-09-12 NOTE — ED Triage Notes (Signed)
Pt reports increase urinary frequency ad burning when urinating x 2 weeks.

## 2022-09-12 NOTE — Telephone Encounter (Signed)
Pharmacy called and reported electronic prescriptions are not coming over in their system and needed a verbal and NPI from NP that prescribed pt's discharge medication.  Chart reviewed, medication discussed, verbal with readback. NP aware and NPI also provided via phone.

## 2022-09-12 NOTE — Discharge Instructions (Addendum)
I think you have a UTI today.  Take the Keflex as prescribed to treat this.  Make sure you are drinking lots of water.  You can take Pyridium every 8 hours as needed for bladder pain.  As we discussed, if your symptoms worsen, go to the ER.

## 2022-09-14 LAB — URINE CULTURE: Culture: >=100000 — AB

## 2022-11-18 ENCOUNTER — Ambulatory Visit
Admission: EM | Admit: 2022-11-18 | Discharge: 2022-11-18 | Disposition: A | Discharge status: Home / Self Care | Payer: 59 | Attending: Nurse Practitioner | Admitting: Nurse Practitioner

## 2022-11-18 DIAGNOSIS — Z1152 Encounter for screening for COVID-19: Secondary | ICD-10-CM | POA: Diagnosis not present

## 2022-11-18 DIAGNOSIS — J069 Acute upper respiratory infection, unspecified: Secondary | ICD-10-CM | POA: Insufficient documentation

## 2022-11-18 LAB — POCT INFLUENZA A/B
Influenza A, POC: NEGATIVE
Influenza B, POC: NEGATIVE

## 2022-11-18 MED ORDER — BENZONATATE 100 MG PO CAPS: 100.0000 mg | ORAL_CAPSULE | Freq: Three times a day (TID) | ORAL | 0 refills | Status: DC | PRN

## 2022-11-18 MED ORDER — PROMETHAZINE-DM 6.25-15 MG/5ML PO SYRP: 5.0000 mL | ORAL_SOLUTION | Freq: Every evening | ORAL | 0 refills | Status: DC | PRN

## 2022-11-18 NOTE — ED Provider Notes (Signed)
[facility]    CSN: [id number] Arrival date & time: 11/18/22  1256      History   Chief Complaint No chief complaint on file.   HPI Brandy Hensley is a 52 y.o. female.   Patient presents today for 1 day history of low-grade fever, chills, sneezing, dry cough, stuffy and runny nose, postnasal drainage and scratchy throat, headache, hoarseness, bilateral ear fullness without drainage, decreased appetite, fatigue, and feels weak.  Patient denies body aches, chest congestion, shortness of breath or chest pain, abdominal pain, nausea/vomiting, diarrhea, loss of taste or smell, new rash, and known sick contacts.  Has been taking liquid Mucinex for symptoms with minimal improvement.  Patient reports a medical history significant for COPD, controlled with as needed albuterol inhaler.    Past Medical History:  Diagnosis Date   COPD (chronic obstructive pulmonary disease) ([place])    Diverticulitis    GERD (gastroesophageal reflux disease)    PONV (postoperative nausea and vomiting)     Patient Active Problem List   Diagnosis Date Noted   Cervical radiculopathy 02/13/2022   Annual physical exam 12/02/2021   Fecal incontinence 12/02/2021   DOE (dyspnea on exertion) 12/22/2018   COPD GOLD II (barely)  but MZ/ smoker 12/22/2018   Irritable bowel syndrome with diarrhea 01/15/2017   Hyperlipidemia 07/05/2015   GERD (gastroesophageal reflux disease) 10/31/2013    Past Surgical History:  Procedure Laterality Date   CHOLECYSTECTOMY  09/10/2012   Procedure: LAPAROSCOPIC CHOLECYSTECTOMY;  Surgeon: Jamesetta So, MD;  Location: [facility];  Service: General;  Laterality: N/A;   COLONOSCOPY  2005-2006   Dr.Rehman @[facility]    TUBAL LIGATION      OB History   No obstetric history on file.      Home Medications    Prior to Admission medications   Medication Sig Start Date End Date Taking? Authorizing Provider  acetaminophen (TYLENOL) 325 MG tablet Take 650 mg by mouth every 6 (six)  hours as needed.    [provider]  albuterol (PROVENTIL) (2.5 MG/3ML) 0.083% nebulizer solution Take 3 mLs (2.5 mg total) by nebulization every 6 (six) hours as needed for wheezing or shortness of breath. 12/10/20   Rodriguez-Southworth, Sunday Spillers, PA-C  albuterol (VENTOLIN HFA) 108 (90 Base) MCG/ACT inhaler Inhale 2 puffs into the lungs every 6 (six) hours as needed for wheezing or shortness of breath. 07/29/19   Kathyrn Drown, MD  cyclobenzaprine (FLEXERIL) 10 MG tablet Take 1 tablet (10 mg total) by mouth 3 (three) times daily as needed for muscle spasms. 02/13/22   Coral Spikes, DO  Famotidine (PEPCID PO) Take 10 mg by mouth daily as needed (Acid after eating dinner).    [provider]  ibuprofen (ADVIL) 200 MG tablet Take 200 mg by mouth every 6 (six) hours as needed.    [provider]  meclizine (ANTIVERT) 25 MG tablet Take 1 tablet (25 mg total) by mouth 3 (three) times daily as needed for dizziness. 11/09/20   Erven Colla, DO  phenazopyridine (PYRIDIUM) 100 MG tablet Take 1 tablet (100 mg total) by mouth 3 (three) times daily as needed for pain. 09/12/22   Eulogio Bear, NP  buPROPion (WELLBUTRIN SR) 150 MG 12 hr tablet TAKE 1 BY MOUTH EVERY MORNING FOR 3 DAYS, THEN 1 TABLET TWICE A DAY Patient not taking: Reported on 06/27/2019 03/22/19 10/16/19  Mikey Kirschner, MD  pantoprazole (PROTONIX) 40 MG tablet Take 30-60 min before first meal of the day  Patient not taking: No sig reported 02/15/19 10/16/19  Tanda Rockers, MD    Family History Family History  Problem Relation Age of Onset   Hypertension Mother    Osteoporosis Mother    COPD Mother        smoked   Heart attack Maternal Grandmother    Cancer Maternal Grandmother 60       pancreatic cancer   Heart disease Maternal Grandmother 76       MI/CABG   Emphysema Father        smoked   Lung cancer Father     Social History Social History   Tobacco Use   Smoking status: Every Day     Packs/day: 1.00    Years: 31.00    Additional pack years: 0.00    Total pack years: 31.00    Types: Cigarettes   Smokeless tobacco: Never  Vaping Use   Vaping Use: Never used  Substance Use Topics   Alcohol use: No    Alcohol/week: 0.0 standard drinks of alcohol   Drug use: No     Allergies   Codeine, Flovent diskus [fluticasone furoate], Augmentin [amoxicillin-pot clavulanate], Contrave [naltrexone-bupropion hcl er], and Qsymia [phentermine-topiramate]   Review of Systems Review of Systems Per HPI  Physical Exam Triage Vital Signs ED Triage Vitals  Enc Vitals Group     BP 11/18/22 1325 112/75     Pulse Rate 11/18/22 1325 80     Resp 11/18/22 1325 20     Temp 11/18/22 1326 (!) 96.8 F (36 C)     Temp Source 11/18/22 1326 Temporal     SpO2 11/18/22 1325 95 %     Weight --      Height --      Head Circumference --      Peak Flow --      Pain Score 11/18/22 1327 3     Pain Loc --      Pain Edu? --      Excl. in [place]? --    No data found.  Updated Vital Signs BP 112/75 (BP Location: Right Arm)   Pulse 80   Temp (!) 96.8 F (36 C) (Temporal)   Resp 20   SpO2 95%   Visual Acuity Right Eye Distance:   Left Eye Distance:   Bilateral Distance:    Right Eye Near:   Left Eye Near:    Bilateral Near:     Physical Exam Vitals and nursing note reviewed.  Constitutional:      General: She is not in acute distress.    Appearance: Normal appearance. She is not ill-appearing or toxic-appearing.  HENT:     Head: Normocephalic and atraumatic.     Right Ear: Tympanic membrane, ear canal and external ear normal.     Left Ear: Tympanic membrane, ear canal and external ear normal.     Nose: Rhinorrhea present. No congestion.     Mouth/Throat:     Mouth: Mucous membranes are moist.     Pharynx: Oropharynx is clear. Posterior oropharyngeal erythema present. No oropharyngeal exudate.  Eyes:     General: No scleral icterus.    Extraocular Movements: Extraocular  movements intact.  Cardiovascular:     Rate and Rhythm: Normal rate and regular rhythm.  Pulmonary:     Effort: Pulmonary effort is normal. No respiratory distress.     Breath sounds: Normal breath sounds. No wheezing, rhonchi or rales.  Abdominal:     General: Abdomen is  flat. Bowel sounds are normal. There is no distension.     Palpations: Abdomen is soft.  Musculoskeletal:     Cervical back: Normal range of motion and neck supple.  Lymphadenopathy:     Cervical: No cervical adenopathy.  Skin:    General: Skin is warm and dry.     Coloration: Skin is not jaundiced or pale.     Findings: No erythema or rash.  Neurological:     Mental Status: She is alert and oriented to person, place, and time.  Psychiatric:        Behavior: Behavior is cooperative.      UC Treatments / Results  Labs (all labs ordered are listed, but only abnormal results are displayed) Labs Reviewed  POCT INFLUENZA A/B    EKG   Radiology No results found.  Procedures Procedures (including critical care time)  Medications Ordered in UC Medications - No data to display  Initial Impression / Assessment and Plan / UC Course  I have reviewed the triage vital signs and the nursing notes.  Pertinent labs & imaging results that were available during my care of the patient were reviewed by me and considered in my medical decision making (see chart for details).   Patient is well-appearing, normotensive, afebrile, not tachycardic, not tachypneic, oxygenating well on room air.    1. Viral URI with cough 2. Encounter for screening for COVID-19 Suspect viral etiology Vital signs and examination today are reassuring COVID-19 testing obtained Supportive care discussed Start cough suppressant ER and return precautions discussed Recommended follow-up with [place] or primary care provider if she develops chest congestion with lots of productive mucus Note given for work  The patient was given the opportunity  to ask questions.  All questions answered to their satisfaction.  The patient is in agreement to this plan.    Final Clinical Impressions(s) / UC Diagnoses   Final diagnoses:  None   Discharge Instructions   None    ED Prescriptions   None    PDMP not reviewed this encounter.   Eulogio Bear, NP 11/18/22 [id number]

## 2022-11-18 NOTE — ED Triage Notes (Signed)
Pt reports she kept sneezing yesterday. Today she woke up with a hoarse voice, headache,  nasal drainage, low grade fever, chills, clogged ears.

## 2022-11-18 NOTE — Discharge Instructions (Addendum)
You have a viral upper respiratory infection.  Symptoms should improve over the next week to 10 days.  If you develop chest pain or shortness of breath, go to the emergency room.  We have tested you today for COVID-19 and influenza.  The flu test today was negative.  You will see the COVID-19 results in [place] and we will call you with positive results.  Please stay home and isolate until you are aware of the results.    Some things that can make you feel better are: - Increased rest - Increasing fluid with water/sugar free electrolytes - Acetaminophen and ibuprofen as needed for fever/pain - Salt water gargling, chloraseptic spray and throat lozenges - OTC guaifenesin (Mucinex) 600 mg twice daily - Saline sinus flushes or a neti pot - Humidifying the air -Tessalon Perles during the day as needed for dry cough and cough syrup at nighttime as needed for dry cough

## 2022-11-19 LAB — SARS CORONAVIRUS 2 (TAT 6-24 HRS): SARS Coronavirus 2: NEGATIVE

## 2022-11-23 IMAGING — CT CT HEAD W/O CM
3 series · 15 of 47 positions shown, 18 images · non-contrast
Comparison: None.

CLINICAL DATA: Dizziness.

EXAM:
CT HEAD WITHOUT CONTRAST
TECHNIQUE: Contiguous axial images were obtained from the base of the skull
through the vertex without intravenous contrast.

[Series 2: head w o · axial · 0.45mm/px · z∈[-12,+113]mm · 9 of 31 slices shown, 12 images]
[im 3/31  brain]
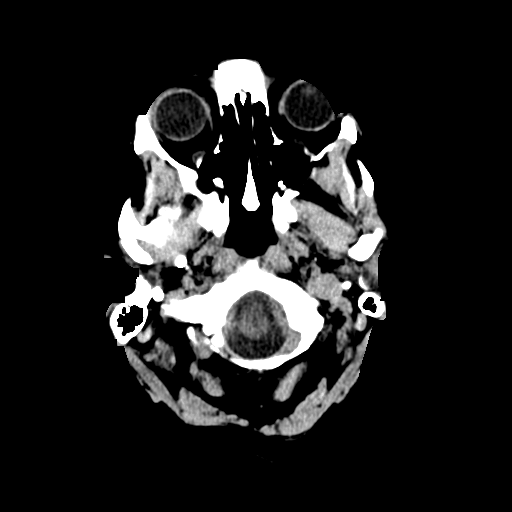
[im 3/31  bone]
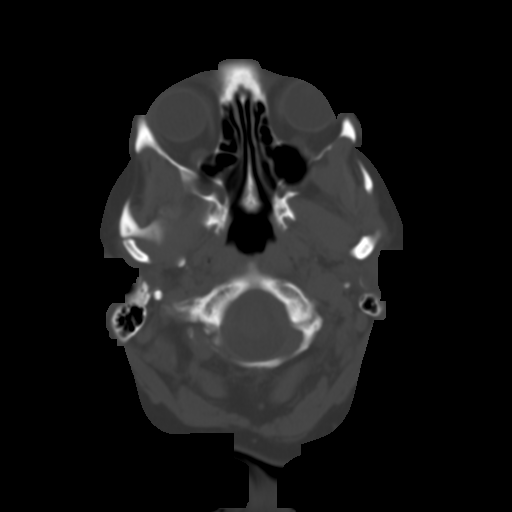
[im 6/31  brain]
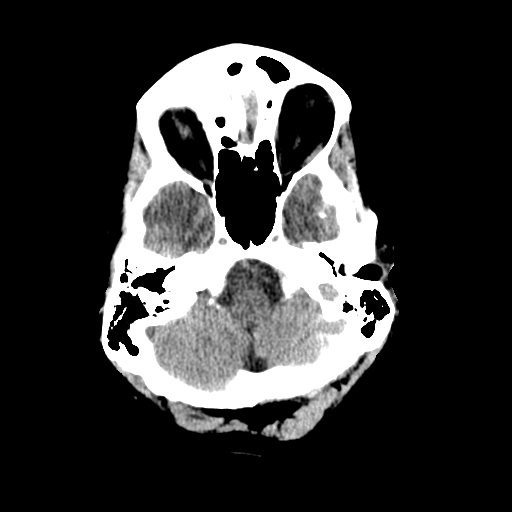
[im 9/31  brain]
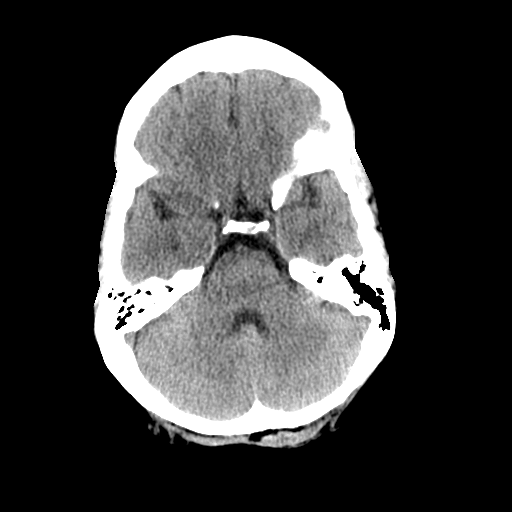
[im 12/31  brain]
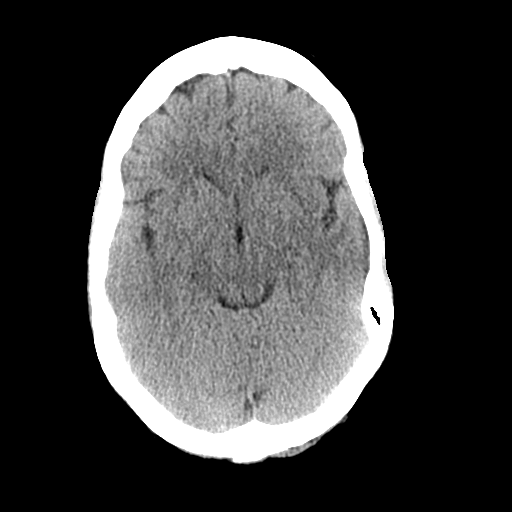
[im 16/31  brain]
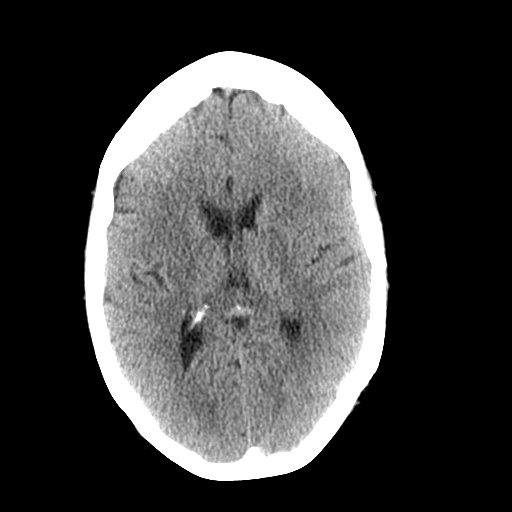
[im 16/31  bone]
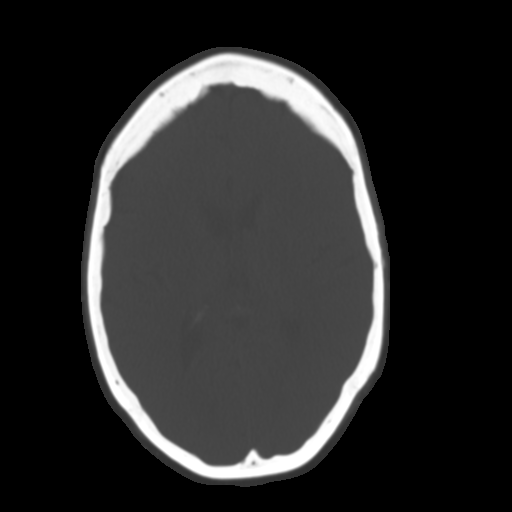
[im 19/31  brain]
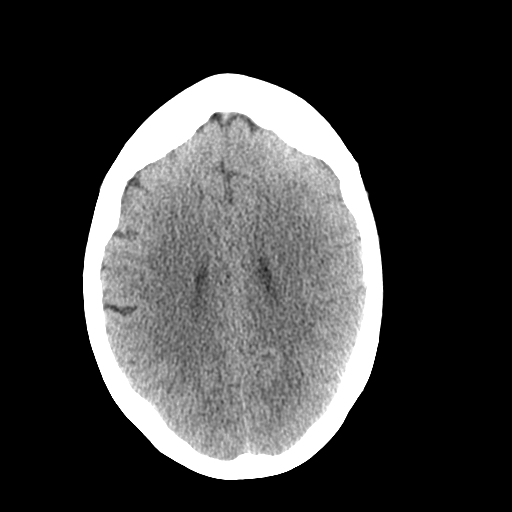
[im 22/31  brain]
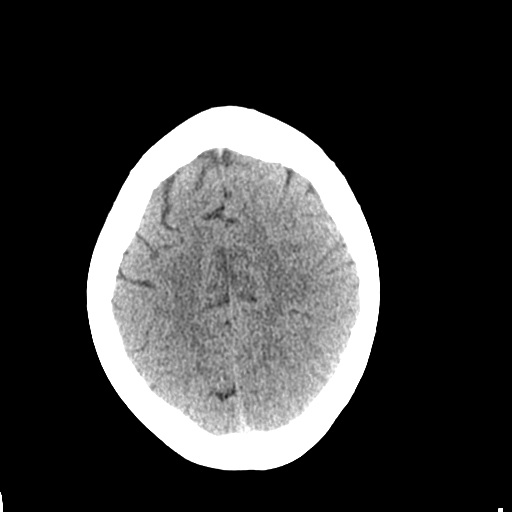
[im 25/31  brain]
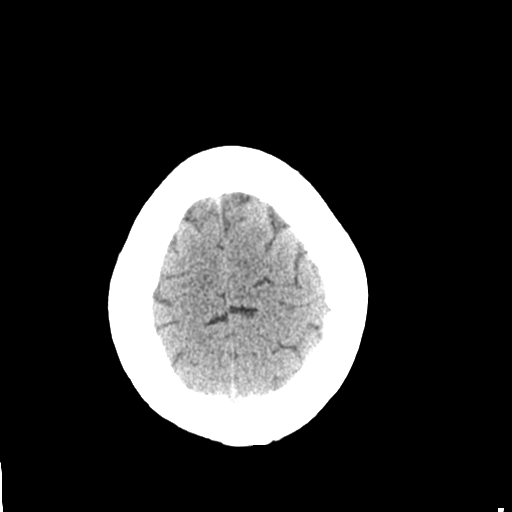
[im 28/31  brain]
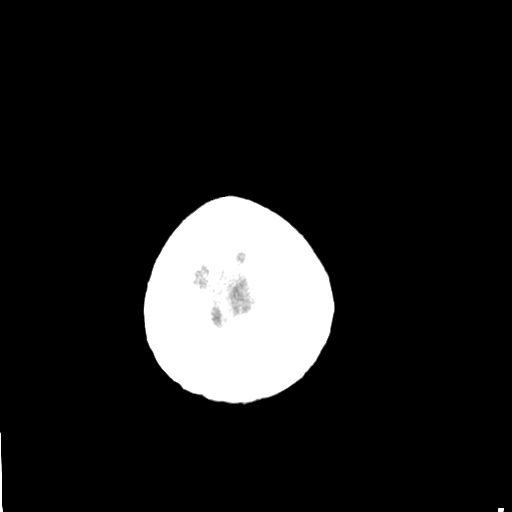
[im 28/31  bone]
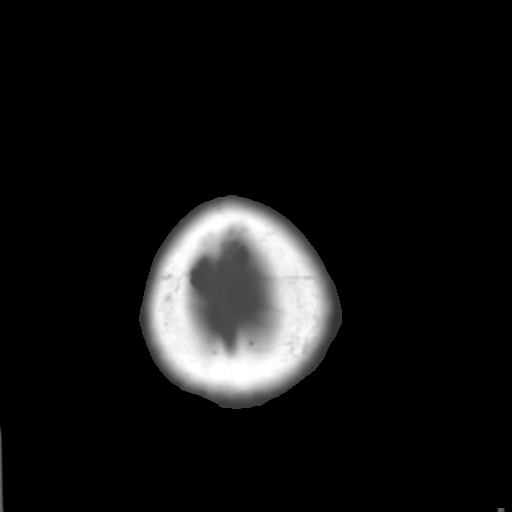

[Series 4: coronal soft · coronal · 0.31mm/px · 3 of 68 slices shown]
[im 23/68  brain]
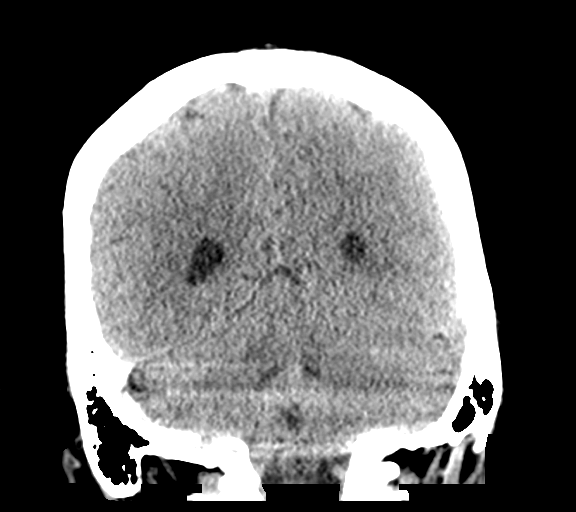
[im 30/68  brain]
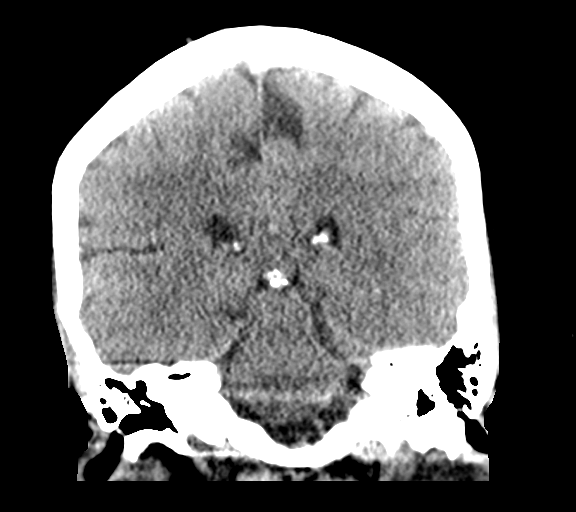
[im 38/68  brain]
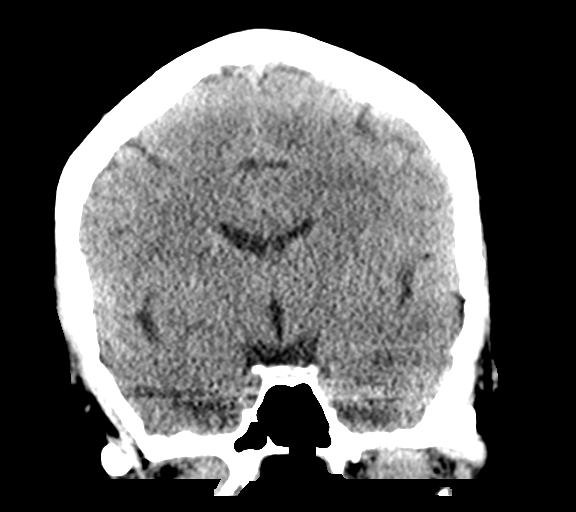

[Series 5: sagittal soft · sagittal · 0.35mm/px · 3 of 55 slices shown]
[im 19/55  brain]
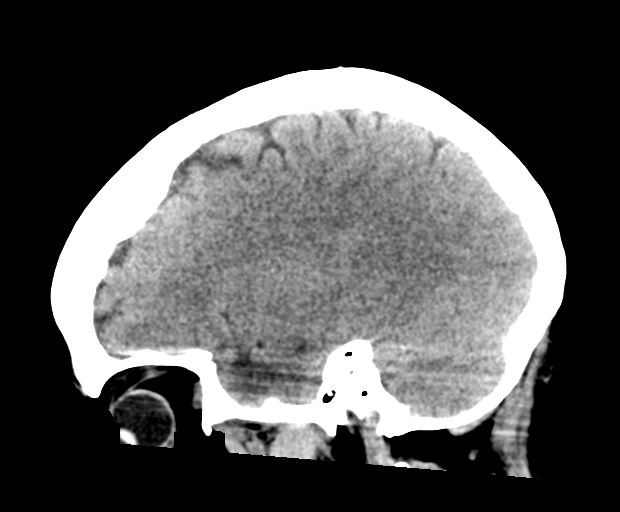
[im 28/55  brain]
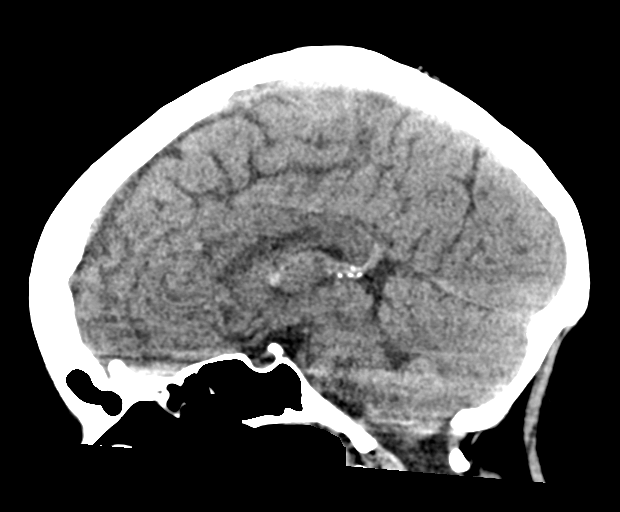
[im 37/55  brain]
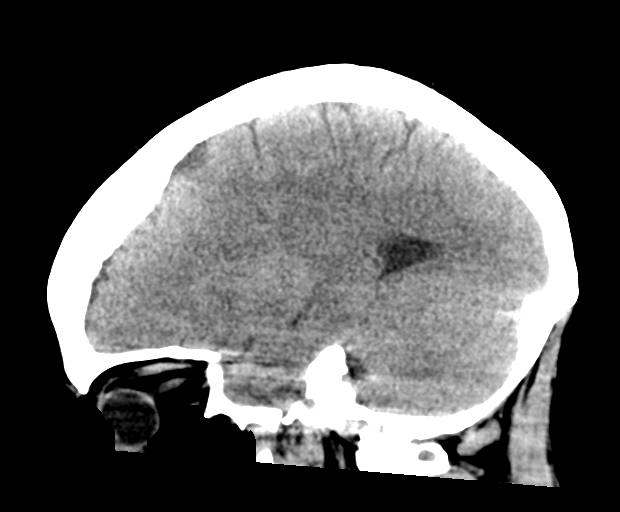

[15 of 47 positions shown; findings below may reference images not displayed]

FINDINGS: Brain: No evidence of acute large vascular territory infarction,
hemorrhage, hydrocephalus, or extra-axial fluid collection. There is
a 5 mm round hyperdense lesion in the region of the foramen of Blain
(series 2, image 14 and series 4, image 30), compatible with a
colloid cyst.

Vascular: No hyperdense vessel identified.

Skull: No acute fracture.

Sinuses/Orbits: Mild ethmoid air cell mucosal thickening. Otherwise,
visualized sinuses are clear. Unremarkable orbits.

Other: No mastoid effusions.
IMPRESSION: Findings consistent with a 5 mm colloid cyst at the foramen of
Blain. No evidence of hydrocephalus at this time, but given the risk
for developing hydrocephalus recommend neurosurgical consultation
for management/follow-up.

Findings and recommendations discussed with Dr. Casanova via

## 2022-12-09 ENCOUNTER — Ambulatory Visit: Payer: 59 | Admitting: Family Medicine

## 2022-12-09 VITALS — BP 119/81 | HR 91 | Temp 97.7°F | Wt 217.0 lb

## 2022-12-09 DIAGNOSIS — R109 Unspecified abdominal pain: Secondary | ICD-10-CM | POA: Insufficient documentation

## 2022-12-09 LAB — POCT URINALYSIS DIP (CLINITEK)
Nitrite, UA: NEGATIVE
POC PROTEIN,UA: NEGATIVE
Spec Grav, UA: 1.015 (ref 1.010–1.025)
pH, UA: 5 (ref 5.0–8.0)

## 2022-12-09 MED ORDER — CIPROFLOXACIN HCL 500 MG PO TABS: 500.0000 mg | ORAL_TABLET | Freq: Two times a day (BID) | ORAL | 0 refills | Status: DC

## 2022-12-09 NOTE — Progress Notes (Signed)
Subjective:  Patient ID: Brandy Hensley, female    DOB: 12-06-1970  Age: 52 y.o. MRN: [id number]  CC: Chief Complaint  Patient presents with   left side ABD pain     Persistent since Saturday , slight constipation, chills off and on , no fever, nausea, vertigo Hx of IBS     HPI:  52 year old female with GERD, IBS, and history of diverticulitis presents for evaluation of abdominal pain.  Abdominal pain started Saturday.  Patient reports that the pain is predominantly located on the left flank.  Harder stools than she normally does.  No fever.  Patient states that she has had some urinary urgency.  No dysuria.  Patient states that this feels different than prior diverticulitis flare.  No relieving factors.  No other associated symptoms.  No other complaints.   Patient Active Problem List   Diagnosis Date Noted   Flank pain 12/09/2022   Cervical radiculopathy 02/13/2022   Annual physical exam 12/02/2021   Fecal incontinence 12/02/2021   DOE (dyspnea on exertion) 12/22/2018   COPD GOLD II (barely)  but MZ/ smoker 12/22/2018   Irritable bowel syndrome with diarrhea 01/15/2017   Hyperlipidemia 07/05/2015   GERD (gastroesophageal reflux disease) 10/31/2013    Social Hx   Social History   Socioeconomic History   Marital status: Divorced    Spouse name: Not on file   Number of children: Not on file   Years of education: Not on file   Highest education level: Not on file  Occupational History   Not on file  Tobacco Use   Smoking status: Every Day    Packs/day: 1.00    Years: 31.00    Additional pack years: 0.00    Total pack years: 31.00    Types: Cigarettes   Smokeless tobacco: Never  Vaping Use   Vaping Use: Never used  Substance and Sexual Activity   Alcohol use: No    Alcohol/week: 0.0 standard drinks of alcohol   Drug use: No   Sexual activity: Yes    Birth control/protection: Surgical  Other Topics Concern   Not on file  Social History Narrative   Not on file    Social Determinants of Health   Financial Resource Strain: Not on file  Food Insecurity: Not on file  Transportation Needs: Not on file  Physical Activity: Not on file  Stress: Not on file  Social Connections: Not on file    Review of Systems Per HPI  Objective:  BP 119/81   Pulse 91   Temp 97.7 F (36.5 C)   Wt 217 lb (98.4 kg)   SpO2 97%   BMI 33.99 kg/m      12/09/2022    1:29 PM 11/18/2022    1:25 PM 09/12/2022   10:16 AM  BP/Weight  Systolic BP 119 112 118  Diastolic BP 81 75 80  Wt. (Lbs) 217    BMI 33.99 kg/m2      Physical Exam Vitals and nursing note reviewed.  Constitutional:      Appearance: Normal appearance.  HENT:     Head: Normocephalic and atraumatic.  Cardiovascular:     Rate and Rhythm: Normal rate and regular rhythm.  Pulmonary:     Effort: Pulmonary effort is normal.     Breath sounds: Normal breath sounds.  Abdominal:     General: There is no distension.     Palpations: Abdomen is soft.     Tenderness: There is no abdominal tenderness. There  is left CVA tenderness.  Neurological:     Mental Status: She is alert.     Lab Results  Component Value Date   WBC 7.2 11/22/2021   HGB 14.8 11/22/2021   HCT 44.0 11/22/2021   PLT 175 11/22/2021   GLUCOSE 85 11/22/2021   CHOL 216 (H) 11/22/2021   TRIG 133 11/22/2021   HDL 46 11/22/2021   LDLCALC 146 (H) 11/22/2021   ALT 9 11/22/2021   AST 15 11/22/2021   NA 141 11/22/2021   K 3.9 11/22/2021   CL 104 11/22/2021   CREATININE 0.88 11/22/2021   BUN 7 11/22/2021   CO2 25 11/22/2021   TSH 1.250 11/22/2020     Assessment & Plan:   Problem List Items Addressed This Visit       Other   Flank pain - Primary    Patient with flank pain.  CVA tenderness on exam.  UA notable for trace blood and small leukocytes.  Sending culture.  Concern for pyelonephritis.  We discussed CT imaging.  Patient elected for empiric treatment at this time.  She wanted to wait on CT imaging.  Placing on  Cipro.      Relevant Orders   POCT URINALYSIS DIP (CLINITEK) (Completed)   Urine Culture    Meds ordered this encounter  Medications   ciprofloxacin (CIPRO) 500 MG tablet    Sig: Take 1 tablet (500 mg total) by mouth 2 (two) times daily.    Dispense:  20 tablet    Refill:  0    Follow-up:  Return if symptoms worsen or fail to improve.  Everlene Other DO [facility]

## 2022-12-09 NOTE — Assessment & Plan Note (Signed)
Patient with flank pain.  CVA tenderness on exam.  UA notable for trace blood and small leukocytes.  Sending culture.  Concern for pyelonephritis.  We discussed CT imaging.  Patient elected for empiric treatment at this time.  She wanted to wait on CT imaging.  Placing on Cipro.

## 2022-12-09 NOTE — Patient Instructions (Signed)
Medication as directed. ? ?Call with concerns. ? ?Take care ? ?Dr. Haidar Muse ?

## 2022-12-13 LAB — URINE CULTURE

## 2023-01-26 ENCOUNTER — Telehealth: Payer: Self-pay | Admitting: Family Medicine

## 2023-01-26 NOTE — Telephone Encounter (Signed)
Patient has physical with Brandy Hensley but needs labs done before physical because she has a form for work that need to be done at appointment.

## 2023-01-29 ENCOUNTER — Other Ambulatory Visit: Payer: Self-pay | Admitting: Nurse Practitioner

## 2023-01-29 ENCOUNTER — Encounter: Payer: Self-pay | Admitting: Nurse Practitioner

## 2023-01-29 DIAGNOSIS — E785 Hyperlipidemia, unspecified: Secondary | ICD-10-CM

## 2023-01-29 DIAGNOSIS — Z0000 Encounter for general adult medical examination without abnormal findings: Secondary | ICD-10-CM

## 2023-01-29 DIAGNOSIS — Z1329 Encounter for screening for other suspected endocrine disorder: Secondary | ICD-10-CM

## 2023-01-29 DIAGNOSIS — Z130 Encounter for screening for diseases of the blood and blood-forming organs and certain disorders involving the immune mechanism: Secondary | ICD-10-CM

## 2023-01-29 NOTE — Telephone Encounter (Signed)
Labs ordered and patient notified by my chart.

## 2023-01-30 LAB — CBC WITH DIFFERENTIAL/PLATELET
Basophils Absolute: 0 10*3/uL (ref 0.0–0.2)
Basos: 1 %
EOS (ABSOLUTE): 0.3 10*3/uL (ref 0.0–0.4)
Eos: 4 %
Hematocrit: 43 % (ref 34.0–46.6)
Hemoglobin: 14.3 g/dL (ref 11.1–15.9)
Immature Grans (Abs): 0 10*3/uL (ref 0.0–0.1)
Immature Granulocytes: 0 %
Lymphocytes Absolute: 2.9 10*3/uL (ref 0.7–3.1)
Lymphs: 42 %
MCH: 30.6 pg (ref 26.6–33.0)
MCHC: 33.3 g/dL (ref 31.5–35.7)
MCV: 92 fL (ref 79–97)
Monocytes Absolute: 0.5 10*3/uL (ref 0.1–0.9)
Monocytes: 8 %
Neutrophils Absolute: 3.1 10*3/uL (ref 1.4–7.0)
Neutrophils: 45 %
Platelets: 170 10*3/uL (ref 150–450)
RBC: 4.68 x10E6/uL (ref 3.77–5.28)
RDW: 12.3 % (ref 11.7–15.4)
WBC: 6.8 10*3/uL (ref 3.4–10.8)

## 2023-01-30 LAB — LIPID PANEL
Chol/HDL Ratio: 4.8 ratio — ABNORMAL HIGH (ref 0.0–4.4)
Cholesterol, Total: 219 mg/dL — ABNORMAL HIGH (ref 100–199)
HDL: 46 mg/dL (ref >39)
LDL Chol Calc (NIH): 151 mg/dL — ABNORMAL HIGH (ref 0–99)
Triglycerides: 120 mg/dL (ref 0–149)
VLDL Cholesterol Cal: 22 mg/dL (ref 5–40)

## 2023-01-30 LAB — COMPREHENSIVE METABOLIC PANEL
ALT: 15 IU/L (ref 0–32)
AST: 18 IU/L (ref 0–40)
Albumin/Globulin Ratio: 1.7 (ref 1.2–2.2)
Albumin: 4.3 g/dL (ref 3.9–4.9)
Alkaline Phosphatase: 86 IU/L (ref 44–121)
BUN/Creatinine Ratio: 11 (ref 9–23)
BUN: 10 mg/dL (ref 6–24)
Bilirubin Total: 0.5 mg/dL (ref 0.0–1.2)
CO2: 23 mmol/L (ref 20–29)
Calcium: 9.8 mg/dL (ref 8.7–10.2)
Chloride: 105 mmol/L (ref 96–106)
Creatinine, Ser: 0.89 mg/dL (ref 0.57–1.00)
Globulin, Total: 2.6 g/dL (ref 1.5–4.5)
Glucose: 93 mg/dL (ref 70–99)
Potassium: 4.2 mmol/L (ref 3.5–5.2)
Sodium: 144 mmol/L (ref 134–144)
Total Protein: 6.9 g/dL (ref 6.0–8.5)
eGFR: 79 mL/min/{1.73_m2} (ref >59)

## 2023-01-30 LAB — TSH: TSH: 1.05 u[IU]/mL (ref 0.450–4.500)

## 2023-02-04 ENCOUNTER — Ambulatory Visit (INDEPENDENT_AMBULATORY_CARE_PROVIDER_SITE_OTHER): Payer: 59 | Admitting: Nurse Practitioner

## 2023-02-04 ENCOUNTER — Encounter: Payer: Self-pay | Admitting: Nurse Practitioner

## 2023-02-04 VITALS — BP 114/76 | HR 92 | Ht 67.0 in | Wt 216.0 lb

## 2023-02-04 DIAGNOSIS — Z720 Tobacco use: Secondary | ICD-10-CM

## 2023-02-04 DIAGNOSIS — Z1231 Encounter for screening mammogram for malignant neoplasm of breast: Secondary | ICD-10-CM

## 2023-02-04 DIAGNOSIS — R42 Dizziness and giddiness: Secondary | ICD-10-CM | POA: Diagnosis not present

## 2023-02-04 DIAGNOSIS — Z01411 Encounter for gynecological examination (general) (routine) with abnormal findings: Secondary | ICD-10-CM | POA: Diagnosis not present

## 2023-02-04 DIAGNOSIS — E785 Hyperlipidemia, unspecified: Secondary | ICD-10-CM

## 2023-02-04 DIAGNOSIS — Z122 Encounter for screening for malignant neoplasm of respiratory organs: Secondary | ICD-10-CM

## 2023-02-04 DIAGNOSIS — Z01419 Encounter for gynecological examination (general) (routine) without abnormal findings: Secondary | ICD-10-CM

## 2023-02-04 MED ORDER — VARENICLINE TARTRATE (STARTER) 0.5 MG X 11 & 1 MG X 42 PO TBPK: ORAL_TABLET | ORAL | 0 refills | Status: DC

## 2023-02-04 NOTE — Progress Notes (Signed)
Subjective:    Patient ID: Brandy Hensley, female    DOB: 1971-01-14, 52 y.o.   MRN: [id number]  HPI  The patient comes in today for a wellness visit.    A review of their health history was completed.  A review of medications was also completed.  Any needed refills; no  Eating habits: fair  Falls/  MVA accidents in past few months: no  Regular exercise: no; works a Librarian, academic pt sees on regular basis: no  Preventative health issues were discussed.   Additional concerns: work form for insurance / labs are done No new sexual partners. No menses x 14 months.  Chronic vertigo for years. Mainly occurs when she sits up and raises her head from a supine position. Requests referral to ENT. Smoking less than 1/2 ppd. Interested in medication for smoking cessation. Rare use of albuterol inhaler.  Regular vision and dental exams.    Review of Systems  Constitutional:  Negative for activity change, appetite change and fatigue.  HENT:  Negative for sore throat, tinnitus and trouble swallowing.   Respiratory:  Negative for cough, chest tightness, shortness of breath and wheezing.   Cardiovascular:  Negative for chest pain and leg swelling.  Gastrointestinal:  Negative for abdominal distention, abdominal pain, constipation, diarrhea, nausea and vomiting.  Genitourinary:  Positive for enuresis. Negative for difficulty urinating, dysuria, frequency, genital sores, pelvic pain, urgency, vaginal bleeding and vaginal discharge.       Mild stress incontinence. Denies vulvar rash, itching or irritation.   Neurological:  Positive for dizziness. Negative for syncope.      02/04/2023   11:09 AM  Depression screen PHQ 2/9  Decreased Interest 1  Down, Depressed, Hopeless 1  PHQ - 2 Score 2  Altered sleeping 1  Tired, decreased energy 1  Change in appetite 0  Feeling bad or failure about yourself  0  Trouble concentrating 0  Moving slowly or fidgety/restless 0  Suicidal thoughts  0  PHQ-9 Score 4  Difficult doing work/chores Not difficult at all      02/04/2023   11:09 AM 12/09/2022    1:38 PM  GAD 7 : Generalized Anxiety Score  Nervous, Anxious, on Edge 1 1  Control/stop worrying 0 0  Worry too much - different things 0 0  Trouble relaxing 0 1  Restless 0 0  Easily annoyed or irritable 0 1  Afraid - awful might happen 0 0  Total GAD 7 Score 1 3  Anxiety Difficulty Not difficult at all Not difficult at all         Objective:   Physical Exam Constitutional:      General: She is not in acute distress.    Appearance: She is well-developed.  Neck:     Thyroid: No thyromegaly.     Trachea: No tracheal deviation.     Comments: Thyroid non tender to palpation. No mass or goiter noted.  Cardiovascular:     Rate and Rhythm: Normal rate and regular rhythm.     Heart sounds: Normal heart sounds. No murmur heard. Pulmonary:     Effort: Pulmonary effort is normal.     Breath sounds: Normal breath sounds.  Chest:  Breasts:    Right: No swelling, inverted nipple, mass, skin change or tenderness.     Left: No swelling, inverted nipple, mass, skin change or tenderness.  Abdominal:     General: There is no distension.     Palpations: Abdomen is soft.  Tenderness: There is no abdominal tenderness.  Musculoskeletal:     Cervical back: Normal range of motion and neck supple.  Lymphadenopathy:     Cervical: No cervical adenopathy.     Upper Body:     Right upper body: No supraclavicular, axillary or pectoral adenopathy.     Left upper body: No supraclavicular, axillary or pectoral adenopathy.  Skin:    General: Skin is warm and dry.     Findings: No rash.  Neurological:     Mental Status: She is alert and oriented to person, place, and time.     Gait: Gait normal.     Comments: Mild brief dizziness when sitting from a supine position.  Psychiatric:        Mood and Affect: Mood normal.        Behavior: Behavior normal.        Thought Content: Thought  content normal.        Judgment: Judgment normal.   Defers GU and pelvic exams.  Results for orders placed or performed in visit on 01/29/23  CBC with Differential/Platelet  Result Value Ref Range   WBC 6.8 3.4 - 10.8 x10E3/uL   RBC 4.68 3.77 - 5.28 x10E6/uL   Hemoglobin 14.3 11.1 - 15.9 g/dL   Hematocrit 29.5 62.1 - 46.6 %   MCV 92 79 - 97 fL   MCH 30.6 26.6 - 33.0 pg   MCHC 33.3 31.5 - 35.7 g/dL   RDW 30.8 65.7 - 84.6 %   Platelets 170 150 - 450 x10E3/uL   Neutrophils 45 Not Estab. %   Lymphs 42 Not Estab. %   Monocytes 8 Not Estab. %   Eos 4 Not Estab. %   Basos 1 Not Estab. %   Neutrophils Absolute 3.1 1.4 - 7.0 x10E3/uL   Lymphocytes Absolute 2.9 0.7 - 3.1 x10E3/uL   Monocytes Absolute 0.5 0.1 - 0.9 x10E3/uL   EOS (ABSOLUTE) 0.3 0.0 - 0.4 x10E3/uL   Basophils Absolute 0.0 0.0 - 0.2 x10E3/uL   Immature Granulocytes 0 Not Estab. %   Immature Grans (Abs) 0.0 0.0 - 0.1 x10E3/uL  Comprehensive metabolic panel  Result Value Ref Range   Glucose 93 70 - 99 mg/dL   BUN 10 6 - 24 mg/dL   Creatinine, Ser 9.62 0.57 - 1.00 mg/dL   eGFR 79 >95 MW/UXL/2.44   BUN/Creatinine Ratio 11 9 - 23   Sodium 144 134 - 144 mmol/L   Potassium 4.2 3.5 - 5.2 mmol/L   Chloride 105 96 - 106 mmol/L   CO2 23 20 - 29 mmol/L   Calcium 9.8 8.7 - 10.2 mg/dL   Total Protein 6.9 6.0 - 8.5 g/dL   Albumin 4.3 3.9 - 4.9 g/dL   Globulin, Total 2.6 1.5 - 4.5 g/dL   Albumin/Globulin Ratio 1.7 1.2 - 2.2   Bilirubin Total 0.5 0.0 - 1.2 mg/dL   Alkaline Phosphatase 86 44 - 121 IU/L   AST 18 0 - 40 IU/L   ALT 15 0 - 32 IU/L  Lipid panel  Result Value Ref Range   Cholesterol, Total 219 (H) 100 - 199 mg/dL   Triglycerides 010 0 - 149 mg/dL   HDL 46 >27 mg/dL   VLDL Cholesterol Cal 22 5 - 40 mg/dL   LDL Chol Calc (NIH) 253 (H) 0 - 99 mg/dL   Chol/HDL Ratio 4.8 (H) 0.0 - 4.4 ratio  TSH  Result Value Ref Range   TSH 1.050 0.450 - 4.500 uIU/mL  Reviewed labs with patient.   The 10-year ASCVD risk score  (Arnett DK, et al., 2019) is: 4%   Values used to calculate the score:     Age: 89 years     Sex: Female     Is Non-Hispanic African American: No     Diabetic: No     Tobacco smoker: Yes     Systolic Blood Pressure: 114 mmHg     Is BP treated: No     HDL Cholesterol: 46 mg/dL     Total Cholesterol: 219 mg/dL Today's Vitals   40/98/11 1050  BP: 114/76  Pulse: 92  SpO2: 97%  Weight: 216 lb (98 kg)  Height: 5\' 7"  (1.702 m)   Body mass index is 33.83 kg/m.      Assessment & Plan:   Problem List Items Addressed This Visit       Other   Chronic vertigo   Relevant Orders   Ambulatory referral to ENT   Hyperlipidemia   Tobacco use   Relevant Orders   Ambulatory Referral Lung Cancer Screening [facility]   Other Visit Diagnoses     Well woman exam    -  Primary   Encounter for screening mammogram for malignant neoplasm of breast       Relevant Orders   MM DIGITAL SCREENING BILATERAL   Screening for lung cancer       Relevant Orders   Ambulatory Referral Lung Cancer Screening [place] [facility]      Meds ordered this encounter  Medications   Varenicline Tartrate, Starter, (CHANTIX STARTING MONTH PAK) 0.5 MG X 11 & 1 MG X 42 TBPK    Sig: Take po as directed.    Dispense:  53 each    Refill:  0    Order Specific Question:   Supervising Provider    Answer:   Babs Sciara [phone]   Start Chantix as directed. Recommend starting a statin for lipids. Defers at this time. Refer to ENT for chronic vertigo. Mammogram scheduled. Referred for low dose CT scan of the lungs. Encouraged regular activity such as walking.  Return in about 1 year (around 02/04/2024) for physical.

## 2023-02-04 NOTE — Patient Instructions (Addendum)
Meniere Disease Vaccines at local pharmacy: Shingles COVID Tdap

## 2023-02-06 ENCOUNTER — Encounter: Payer: Self-pay | Admitting: Nurse Practitioner

## 2023-03-09 ENCOUNTER — Encounter: Payer: Self-pay | Admitting: Nurse Practitioner

## 2023-03-10 NOTE — Telephone Encounter (Signed)
Follow up information requested

## 2023-03-13 NOTE — Telephone Encounter (Signed)
Referral coordinator contacted patient. (See note in referrals)

## 2023-04-20 ENCOUNTER — Other Ambulatory Visit: Payer: Self-pay | Admitting: Emergency Medicine

## 2023-04-20 DIAGNOSIS — F17210 Nicotine dependence, cigarettes, uncomplicated: Secondary | ICD-10-CM

## 2023-04-20 DIAGNOSIS — Z87891 Personal history of nicotine dependence: Secondary | ICD-10-CM

## 2023-04-20 DIAGNOSIS — Z122 Encounter for screening for malignant neoplasm of respiratory organs: Secondary | ICD-10-CM

## 2023-04-24 ENCOUNTER — Ambulatory Visit
Admission: EM | Admit: 2023-04-24 | Discharge: 2023-04-24 | Disposition: A | Discharge status: Home / Self Care | Payer: 59 | Attending: Internal Medicine | Admitting: Internal Medicine

## 2023-04-24 DIAGNOSIS — R051 Acute cough: Secondary | ICD-10-CM | POA: Diagnosis not present

## 2023-04-24 DIAGNOSIS — U071 COVID-19: Secondary | ICD-10-CM | POA: Diagnosis not present

## 2023-04-24 MED ORDER — ALBUTEROL SULFATE HFA 108 (90 BASE) MCG/ACT IN AERS: 2.0000 | INHALATION_SPRAY | Freq: Four times a day (QID) | RESPIRATORY_TRACT | 1 refills | Status: DC | PRN

## 2023-04-24 MED ORDER — BENZONATATE 200 MG PO CAPS: 200.0000 mg | ORAL_CAPSULE | Freq: Three times a day (TID) | ORAL | 0 refills | Status: DC | PRN

## 2023-04-24 NOTE — ED Provider Notes (Signed)
[facility]    CSN: [id number] Arrival date & time: 04/24/23  0900      History   Chief Complaint Chief Complaint  Patient presents with   Cough    HPI Brandy Hensley is a 52 y.o. female with a history of COPD on bronchodilator comes to urgent care with worsening cough.  Patient tested positive for COVID-19 a couple of days ago.  She is currently on Paxlovid and Mucinex.  She endorses worsening cough productive of slightly discolored sputum.  Patient denies any shortness of breath.  She has some chest tightness and wheezing.  No fever or chills.  She has been taking Mucinex with no significant improvement.  She continues to have headache and generalized bodyaches. HPI  Past Medical History:  Diagnosis Date   COPD (chronic obstructive pulmonary disease) (HCC)    Diverticulitis    GERD (gastroesophageal reflux disease)    PONV (postoperative nausea and vomiting)     Patient Active Problem List   Diagnosis Date Noted   Chronic vertigo 02/04/2023   Tobacco use 02/04/2023   Flank pain 12/09/2022   Cervical radiculopathy 02/13/2022   Annual physical exam 12/02/2021   Fecal incontinence 12/02/2021   DOE (dyspnea on exertion) 12/22/2018   COPD GOLD II (barely)  but MZ/ smoker 12/22/2018   Irritable bowel syndrome with diarrhea 01/15/2017   Hyperlipidemia 07/05/2015   GERD (gastroesophageal reflux disease) 10/31/2013    Past Surgical History:  Procedure Laterality Date   CHOLECYSTECTOMY  09/10/2012   Procedure: LAPAROSCOPIC CHOLECYSTECTOMY;  Surgeon: Dalia Heading, MD;  Location: [facility];  Service: General;  Laterality: N/A;   COLONOSCOPY  2005-2006   Dr.Rehman @[facility]    TUBAL LIGATION      OB History   No obstetric history on file.      Home Medications    Prior to Admission medications   Medication Sig Start Date End Date Taking? Authorizing Provider  benzonatate (TESSALON) 200 MG capsule Take 1 capsule (200 mg total) by mouth 3 (three) times daily as  needed for cough. 04/24/23  Yes Hildagarde Holleran, Britta Mccreedy, MD  acetaminophen (TYLENOL) 325 MG tablet Take 650 mg by mouth every 6 (six) hours as needed.    [provider]  albuterol (PROVENTIL) (2.5 MG/3ML) 0.083% nebulizer solution Take 3 mLs (2.5 mg total) by nebulization every 6 (six) hours as needed for wheezing or shortness of breath. 12/10/20   Rodriguez-Southworth, Nettie Elm, PA-C  albuterol (VENTOLIN HFA) 108 (90 Base) MCG/ACT inhaler Inhale 2 puffs into the lungs every 6 (six) hours as needed for wheezing or shortness of breath. 04/24/23   Ronney Honeywell, Britta Mccreedy, MD  cyclobenzaprine (FLEXERIL) 10 MG tablet Take 1 tablet (10 mg total) by mouth 3 (three) times daily as needed for muscle spasms. 02/13/22   Tommie Sams, DO  Famotidine (PEPCID PO) Take 10 mg by mouth daily as needed (Acid after eating dinner).    [provider]  ibuprofen (ADVIL) 200 MG tablet Take 200 mg by mouth every 6 (six) hours as needed.    [provider]  meclizine (ANTIVERT) 25 MG tablet Take 1 tablet (25 mg total) by mouth 3 (three) times daily as needed for dizziness. 11/09/20   Annalee Genta, DO  Varenicline Tartrate, Starter, (CHANTIX STARTING MONTH PAK) 0.5 MG X 11 & 1 MG X 42 TBPK Take po as directed. 02/04/23   Campbell Riches, NP    Family History Family History  Problem Relation Age of Onset  Hypertension Mother    Osteoporosis Mother    COPD Mother        smoked   Heart attack Maternal Grandmother    Cancer Maternal Grandmother 33       pancreatic cancer   Heart disease Maternal Grandmother 31       MI/CABG   Emphysema Father        smoked   Lung cancer Father     Social History Social History   Tobacco Use   Smoking status: Every Day    Current packs/day: 0.50    Average packs/day: 0.5 packs/day for 31.0 years (15.5 ttl pk-yrs)    Types: Cigarettes   Smokeless tobacco: Never  Vaping Use   Vaping status: Never Used  Substance Use Topics   Alcohol use: No     Alcohol/week: 0.0 standard drinks of alcohol   Drug use: No     Allergies   Codeine, Flovent diskus [fluticasone furoate], Augmentin [amoxicillin-pot clavulanate], Contrave [naltrexone-bupropion hcl er], and Qsymia [phentermine-topiramate]   Review of Systems Review of Systems As per HPI  Physical Exam Triage Vital Signs ED Triage Vitals  Encounter Vitals Group     BP 04/24/23 0934 103/70     Systolic BP Percentile --      Diastolic BP Percentile --      Pulse Rate 04/24/23 0934 83     Resp 04/24/23 0934 16     Temp 04/24/23 0934 98.8 F (37.1 C)     Temp Source 04/24/23 0934 Oral     SpO2 04/24/23 0934 94 %     Weight --      Height --      Head Circumference --      Peak Flow --      Pain Score 04/24/23 0935 0     Pain Loc --      Pain Education --      Exclude from Growth Chart --    No data found.  Updated Vital Signs BP 103/70 (BP Location: Right Arm)   Pulse 83   Temp 98.8 F (37.1 C) (Oral)   Resp 16   LMP 11/01/2018 (Within Weeks) Comment: perimenopausal  SpO2 94%   Visual Acuity Right Eye Distance:   Left Eye Distance:   Bilateral Distance:    Right Eye Near:   Left Eye Near:    Bilateral Near:     Physical Exam Vitals and nursing note reviewed.  Constitutional:      General: She is not in acute distress.    Appearance: She is ill-appearing.  HENT:     Right Ear: Tympanic membrane normal.     Left Ear: Tympanic membrane normal.  Cardiovascular:     Rate and Rhythm: Normal rate and regular rhythm.     Pulses: Normal pulses.     Heart sounds: Normal heart sounds.  Pulmonary:     Effort: Pulmonary effort is normal.     Breath sounds: Normal breath sounds.  Abdominal:     General: Bowel sounds are normal.     Palpations: Abdomen is soft.  Neurological:     Mental Status: She is alert.      UC Treatments / Results  Labs (all labs ordered are listed, but only abnormal results are displayed) Labs Reviewed - No data to  display  EKG   Radiology No results found.  Procedures Procedures (including critical care time)  Medications Ordered in UC Medications - No data to display  Initial Impression /  Assessment and Plan / UC Course  I have reviewed the triage vital signs and the nursing notes.  Pertinent labs & imaging results that were available during my care of the patient were reviewed by me and considered in my medical decision making (see chart for details).     1.  COVID-19 infection with worsening cough: Patient is concerned that she may have COPD exacerbation Lung exam is reassuring Patient is advised to use her inhalers and nebulizers at home [facility] as needed for cough Adequate hydration is recommended Return precautions given. Final Clinical Impressions(s) / UC Diagnoses   Final diagnoses:  COVID-19 virus infection  Acute cough     Discharge Instructions      Please maintain adequate hydration Continue taking Paxlovid You may take Tylenol or ibuprofen as needed for pain Please take prescription medications as directed No indication for x-rays at this time Your lung exam is reassuring Please return to urgent care to be reevaluated if you have worsening symptoms.   ED Prescriptions     Medication Sig Dispense Auth. Provider   albuterol (VENTOLIN HFA) 108 (90 Base) MCG/ACT inhaler Inhale 2 puffs into the lungs every 6 (six) hours as needed for wheezing or shortness of breath. 18 g Ferlin Fairhurst, Britta Mccreedy, MD   benzonatate (TESSALON) 200 MG capsule Take 1 capsule (200 mg total) by mouth 3 (three) times daily as needed for cough. 21 capsule Maahi Lannan, Britta Mccreedy, MD      PDMP not reviewed this encounter.   Merrilee Jansky, MD 04/24/23 [phone]

## 2023-04-24 NOTE — Discharge Instructions (Addendum)
Please maintain adequate hydration Continue taking Paxlovid You may take Tylenol or ibuprofen as needed for pain Please take prescription medications as directed No indication for x-rays at this time Your lung exam is reassuring Please return to urgent care to be reevaluated if you have worsening symptoms.

## 2023-04-24 NOTE — ED Triage Notes (Signed)
Pt states she is covid + states she has taken 2 doses of paxlovid and is taking Mucinex as well.  Pt states she is coughing more  and is congested.

## 2023-04-30 ENCOUNTER — Ambulatory Visit (HOSPITAL_COMMUNITY): Payer: 59

## 2023-04-30 ENCOUNTER — Ambulatory Visit: Payer: 59 | Admitting: Family Medicine

## 2023-04-30 ENCOUNTER — Encounter: Payer: Self-pay | Admitting: Family Medicine

## 2023-04-30 VITALS — BP 114/80 | HR 72 | Temp 97.8°F | Wt 217.2 lb

## 2023-04-30 DIAGNOSIS — J0100 Acute maxillary sinusitis, unspecified: Secondary | ICD-10-CM

## 2023-04-30 DIAGNOSIS — J329 Chronic sinusitis, unspecified: Secondary | ICD-10-CM | POA: Insufficient documentation

## 2023-04-30 MED ORDER — PROMETHAZINE-DM 6.25-15 MG/5ML PO SYRP: 5.0000 mL | ORAL_SOLUTION | Freq: Four times a day (QID) | ORAL | 0 refills | Status: DC | PRN

## 2023-04-30 MED ORDER — DOXYCYCLINE HYCLATE 100 MG PO TABS: 100.0000 mg | ORAL_TABLET | Freq: Two times a day (BID) | ORAL | 0 refills | Status: DC

## 2023-04-30 NOTE — Progress Notes (Signed)
Subjective:  Patient ID: Brandy Hensley, female    DOB: August 25, 1971  Age: 52 y.o. MRN: [id number]  CC: Respiratory symptoms   HPI:  52 year old female presents for evaluation of the above.  Patient reports that she tested positive COVID-19 on 8/28.  Subsequently seemed to improve.  However, symptoms recurred and worsened over the past couple of days.  She reports severe congestion and associated sinus pain/tenderness.  Also reports associated cough.  No fever.  No relief with over-the-counter treatment.  She also took Paxlovid but had to discontinue due to side effects.  Patient Active Problem List   Diagnosis Date Noted   Sinusitis 04/30/2023   Chronic vertigo 02/04/2023   Tobacco use 02/04/2023   Flank pain 12/09/2022   Cervical radiculopathy 02/13/2022   Annual physical exam 12/02/2021   Fecal incontinence 12/02/2021   DOE (dyspnea on exertion) 12/22/2018   COPD GOLD II (barely)  but MZ/ smoker 12/22/2018   Irritable bowel syndrome with diarrhea 01/15/2017   Hyperlipidemia 07/05/2015   GERD (gastroesophageal reflux disease) 10/31/2013    Social Hx   Social History   Socioeconomic History   Marital status: Divorced    Spouse name: Not on file   Number of children: Not on file   Years of education: Not on file   Highest education level: Not on file  Occupational History   Not on file  Tobacco Use   Smoking status: Every Day    Current packs/day: 0.50    Average packs/day: 0.5 packs/day for 31.0 years (15.5 ttl pk-yrs)    Types: Cigarettes   Smokeless tobacco: Never  Vaping Use   Vaping status: Never Used  Substance and Sexual Activity   Alcohol use: No    Alcohol/week: 0.0 standard drinks of alcohol   Drug use: No   Sexual activity: Yes    Birth control/protection: Surgical  Other Topics Concern   Not on file  Social History Narrative   Not on file   Social Determinants of Health   Financial Resource Strain: Not on file  Food Insecurity: Not on file   Transportation Needs: Not on file  Physical Activity: Not on file  Stress: Not on file  Social Connections: Not on file    Review of Systems Per HPI  Objective:  BP 114/80   Pulse 72   Temp 97.8 F (36.6 C) (Oral)   Wt 217 lb 3.2 oz (98.5 kg)   LMP 11/01/2018 (Within Weeks) Comment: perimenopausal  SpO2 99%   BMI 34.02 kg/m      04/30/2023   10:39 AM 04/24/2023    9:34 AM 02/04/2023   10:50 AM  BP/Weight  Systolic BP 114 103 114  Diastolic BP 80 70 76  Wt. (Lbs) 217.2  216  BMI 34.02 kg/m2  33.83 kg/m2    Physical Exam Vitals and nursing note reviewed.  Constitutional:      General: She is not in acute distress.    Appearance: Normal appearance.  HENT:     Head: Normocephalic and atraumatic.     Nose:     Comments: Congestion and maxillary sinus tenderness to palpation. Eyes:     General:        Right eye: No discharge.        Left eye: No discharge.     Conjunctiva/sclera: Conjunctivae normal.  Cardiovascular:     Rate and Rhythm: Normal rate and regular rhythm.  Pulmonary:     Effort: Pulmonary effort is normal.  Breath sounds: Normal breath sounds. No wheezing, rhonchi or rales.  Neurological:     Mental Status: She is alert.     Lab Results  Component Value Date   WBC 6.8 01/29/2023   HGB 14.3 01/29/2023   HCT 43.0 01/29/2023   PLT 170 01/29/2023   GLUCOSE 93 01/29/2023   CHOL 219 (H) 01/29/2023   TRIG 120 01/29/2023   HDL 46 01/29/2023   LDLCALC 151 (H) 01/29/2023   ALT 15 01/29/2023   AST 18 01/29/2023   NA 144 01/29/2023   K 4.2 01/29/2023   CL 105 01/29/2023   CREATININE 0.89 01/29/2023   BUN 10 01/29/2023   CO2 23 01/29/2023   TSH 1.050 01/29/2023     Assessment & Plan:   Problem List Items Addressed This Visit       Respiratory   Sinusitis - Primary    Secondary bacterial sinusitis.  Treating with doxycycline.  Promethazine DM for cough.      Relevant Medications   doxycycline (VIBRA-TABS) 100 MG tablet    promethazine-dextromethorphan (PROMETHAZINE-DM) 6.25-15 MG/5ML syrup    Meds ordered this encounter  Medications   doxycycline (VIBRA-TABS) 100 MG tablet    Sig: Take 1 tablet (100 mg total) by mouth 2 (two) times daily. Take with food and water.    Dispense:  14 tablet    Refill:  0   promethazine-dextromethorphan (PROMETHAZINE-DM) 6.25-15 MG/5ML syrup    Sig: Take 5 mLs by mouth 4 (four) times daily as needed for cough.    Dispense:  118 mL    Refill:  0    Follow-up:  Return if symptoms worsen or fail to improve.  Everlene Other DO [facility]

## 2023-04-30 NOTE — Assessment & Plan Note (Signed)
Secondary bacterial sinusitis.  Treating with doxycycline.  Promethazine DM for cough.

## 2023-04-30 NOTE — Patient Instructions (Signed)
Antibiotic as prescribed.  Cough medication as needed.  If you worsen or fail to improve, please let me know.

## 2023-05-06 ENCOUNTER — Other Ambulatory Visit: Payer: Self-pay | Admitting: Family Medicine

## 2023-05-06 ENCOUNTER — Telehealth: Payer: Self-pay | Admitting: Family Medicine

## 2023-05-06 MED ORDER — CEFDINIR 300 MG PO CAPS: 300.0000 mg | ORAL_CAPSULE | Freq: Two times a day (BID) | ORAL | 0 refills | Status: DC

## 2023-05-06 NOTE — Telephone Encounter (Signed)
Patient was seen 9/5 and given antibiotic but she states still has head congestion but having allergic reaction to medication so she stopped taking it causing her to have bad headaches and nausea, She is requesting something else called into [facility] [address]

## 2023-05-06 NOTE — Telephone Encounter (Signed)
Tommie Sams, DO     Rx sent in.

## 2023-05-06 NOTE — Telephone Encounter (Signed)
Patient notified

## 2023-05-07 ENCOUNTER — Ambulatory Visit (HOSPITAL_COMMUNITY)
Admission: RE | Admit: 2023-05-07 | Discharge: 2023-05-07 | Disposition: A | Discharge status: Home / Self Care | Payer: 59 | Source: Ambulatory Visit | Attending: Nurse Practitioner | Admitting: Nurse Practitioner

## 2023-05-07 DIAGNOSIS — Z1231 Encounter for screening mammogram for malignant neoplasm of breast: Secondary | ICD-10-CM | POA: Insufficient documentation

## 2023-05-22 ENCOUNTER — Ambulatory Visit (INDEPENDENT_AMBULATORY_CARE_PROVIDER_SITE_OTHER): Payer: 59 | Admitting: Adult Health

## 2023-05-22 ENCOUNTER — Encounter: Payer: Self-pay | Admitting: Adult Health

## 2023-05-22 DIAGNOSIS — F17210 Nicotine dependence, cigarettes, uncomplicated: Secondary | ICD-10-CM

## 2023-05-22 NOTE — Progress Notes (Signed)
  Virtual Visit via Telephone Note  I connected with Brandy Hensley  , 05/22/23 9:26 AM by a telemedicine application and verified that I am speaking with the correct person using two identifiers.  Location: Patient: home Provider: home   I discussed the limitations of evaluation and management by telemedicine and the availability of in person appointments. The patient expressed understanding and agreed to proceed.   Shared Decision Making Visit Lung Cancer Screening Program [phone])   Eligibility: 52 y.o. Pack Years Smoking History Calculation 30  (# packs/per year x # years smoked) 35 years, 1 ppd only 3/4ppd over last 3-5 years Recent History of coughing up blood  no Unexplained weight loss? no ( >Than 15 pounds within the last 6 months ) Prior History Lung / other cancer no (Diagnosis within the last 5 years already requiring surveillance chest CT Scans). Smoking Status Current Smoker   Visit Components: Discussion included one or more decision making aids. YES Discussion included risk/benefits of screening. YES Discussion included potential follow up diagnostic testing for abnormal scans. YES Discussion included meaning and risk of over diagnosis. YES Discussion included meaning and risk of False Positives. YES Discussion included meaning of total radiation exposure. YES  Counseling Included: Importance of adherence to annual lung cancer LDCT screening. YES Impact of comorbidities on ability to participate in the program. YES Ability and willingness to under diagnostic treatment. YES  Smoking Cessation Counseling: Current Smokers:  Discussed importance of smoking cessation. yes Information about tobacco cessation classes and interventions provided to patient. yes Patient provided with "ticket" for LDCT Scan. yes Symptomatic Patient. no Diagnosis Code: Tobacco Use Z72.0 Asymptomatic Patient yes  Counseling (Intermediate counseling: > three minutes counseling)  U0454 Diagnosis Code: Personal History of Nicotine Dependence. U98.119 Information about tobacco cessation classes and interventions provided to patient. Yes Patient provided with "ticket" for LDCT Scan. yes Written Order for Lung Cancer Screening with LDCT placed in Epic. Yes (CT Chest Lung Cancer Screening Low Dose W/O CM) [id number]  Z12.2-Screening of respiratory organs Z87.891-Personal history of nicotine dependence   [name] 05/22/23

## 2023-05-22 NOTE — Patient Instructions (Signed)
Thank you for participating in the [facility]. It was our pleasure to meet you today. We will call you with the results of your scan within the next few days. Your scan will be assigned a Lung RADS category score by the physicians reading the scans.  This Lung RADS score determines follow up scanning.  See below for description of categories, and follow up screening recommendations. We will be in touch to schedule your follow up screening annually or based on recommendations of our providers. We will fax a copy of your scan results to your Primary Care Physician, or the physician who referred you to the program, to ensure they have the results. Please call the office if you have any questions or concerns regarding your scanning experience or results.  Our office number is [phone]. Please speak with Abigail Miyamoto, RN., Karlton Lemon RN, or Pietro Cassis RN. They are  our Lung Cancer Screening RN.'s If They are unavailable when you call, Please leave a message on the voice mail. We will return your call at our earliest convenience.This voice mail is monitored several times a day.  Remember, if your scan is normal, we will scan you annually as long as you continue to meet the criteria for the program. (Age 42-80, Current smoker or smoker who has quit within the last 15 years). If you are a smoker, remember, quitting is the single most powerful action that you can take to decrease your risk of lung cancer and other pulmonary, breathing related problems. We know quitting is hard, and we are here to help.  Please let us know if there is anything we can do to help you meet your goal of quitting. If you are a former smoker, Counselling psychologist. We are proud of you! Remain smoke free! Remember you can refer friends or family members through the number above.  We will screen them to make sure they meet criteria for the program. Thank you for helping [place] take better care of you  by participating in Lung Screening.   Lung RADS Categories:  Lung RADS 1: no nodules or definitely non-concerning nodules.  Recommendation is for a repeat annual scan in 12 months.  Lung RADS 2:  nodules that are non-concerning in appearance and behavior with a very low likelihood of becoming an active cancer. Recommendation is for a repeat annual scan in 12 months.  Lung RADS 3: nodules that are probably non-concerning , includes nodules with a low likelihood of becoming an active cancer.  Recommendation is for a 55-month repeat screening scan. Often noted after an upper respiratory illness. We will be in touch to make sure you have no questions, and to schedule your 45-month scan.  Lung RADS 4 A: nodules with concerning findings, recommendation is most often for a follow up scan in 3 months or additional testing based on our provider's assessment of the scan. We will be in touch to make sure you have no questions and to schedule the recommended 3 month follow up scan.  Lung RADS 4 B:  indicates findings that are concerning. We will be in touch with you to schedule additional diagnostic testing based on our provider's  assessment of the scan.  You can receive free nicotine replacement therapy ( patches, gum or mints) by calling 1-800-QUIT NOW. Please call so we can get you on the path to becoming  a non-smoker. I know it is hard, but you can do this!  Other options for assistance in  smoking cessation ( As covered by your insurance benefits)  Hypnosis for smoking cessation  [employer] [phone]  Acupuncture for smoking cessation  [employer] [phone]  Steps to Quit Smoking Smoking tobacco is the leading cause of preventable death. It can affect almost every organ in the body. Smoking puts you and people around you at risk for many serious, long-lasting (chronic) diseases. Quitting smoking can be hard, but it is one of the best things that you can do for your  health. It is never too late to quit. Do not give up if you cannot quit the first time. Some people need to try many times to quit. Do your best to stick to your quit plan, and talk with your doctor if you have any questions or concerns. How do I get ready to quit? Pick a date to quit. Set a date within the next 2 weeks to give you time to prepare. Write down the reasons why you are quitting. Keep this list in places where you will see it often. Tell your family, friends, and co-workers that you are quitting. Their support is important. Talk with your doctor about the choices that may help you quit. Find out if your health insurance will pay for these treatments. Know the people, places, things, and activities that make you want to smoke (triggers). Avoid them. What first steps can I take to quit smoking? Throw away all cigarettes at home, at work, and in your car. Throw away the things that you use when you smoke, such as ashtrays and lighters. Clean your car. Empty the ashtray. Clean your home, including curtains and carpets. What can I do to help me quit smoking? Talk with your doctor about taking medicines and seeing a counselor. You are more likely to succeed when you do both. If you are pregnant or breastfeeding: Talk with your doctor about counseling or other ways to quit smoking. Do not take medicine to help you quit smoking unless your doctor tells you to. Quit right away Quit smoking completely, instead of slowly cutting back on how much you smoke over a period of time. Stopping smoking right away may be more successful than slowly quitting. Go to counseling. In-person is best if this is an option. You are more likely to quit if you go to counseling sessions regularly. Take medicine You may take medicines to help you quit. Some medicines need a prescription, and some you can buy over-the-counter. Some medicines may contain a drug called nicotine to replace the nicotine in cigarettes.  Medicines may: Help you stop having the desire to smoke (cravings). Help to stop the problems that come when you stop smoking (withdrawal symptoms). Your doctor may ask you to use: Nicotine patches, gum, or lozenges. Nicotine inhalers or sprays. Non-nicotine medicine that you take by mouth. Find resources Find resources and other ways to help you quit smoking and remain smoke-free after you quit. They include: Online chats with a Veterinary surgeon. Phone quitlines. Printed Materials engineer. Support groups or group counseling. Text messaging programs. Mobile phone apps. Use apps on your mobile phone or tablet that can help you stick to your quit plan. Examples of free services include Quit Guide from the CDC and smokefree.gov  What can I do to make it easier to quit?  Talk to your family and friends. Ask them to support and encourage you. Call a phone quitline, such as 1-800-QUIT-NOW, reach out to support groups, or work with a Veterinary surgeon. Ask people  who smoke to not smoke around you. Avoid places that make you want to smoke, such as: Bars. Parties. Smoke-break areas at work. Spend time with people who do not smoke. Lower the stress in your life. Stress can make you want to smoke. Try these things to lower stress: Getting regular exercise. Doing deep-breathing exercises. Doing yoga. Meditating. What benefits will I see if I quit smoking? Over time, you may have: A better sense of smell and taste. Less coughing and sore throat. A slower heart rate. Lower blood pressure. Clearer skin. Better breathing. Fewer sick days. Summary Quitting smoking can be hard, but it is one of the best things that you can do for your health. Do not give up if you cannot quit the first time. Some people need to try many times to quit. When you decide to quit smoking, make a plan to help you succeed. Quit smoking right away, not slowly over a period of time. When you start quitting, get help and support  to keep you smoke-free. This information is not intended to replace advice given to you by your health care provider. Make sure you discuss any questions you have with your health care provider. Document Revised: 08/02/2021 Document Reviewed: 08/02/2021 Elsevier Patient Education  2024 [employer].

## 2023-05-26 ENCOUNTER — Encounter: Payer: 59 | Admitting: Acute Care

## 2023-05-27 ENCOUNTER — Inpatient Hospital Stay
Admission: RE | Admit: 2023-05-27 | Discharge: 2023-05-27 | Disposition: A | Discharge status: Home / Self Care | Payer: 59 | Source: Ambulatory Visit | Attending: Acute Care | Admitting: Acute Care

## 2023-05-27 DIAGNOSIS — Z122 Encounter for screening for malignant neoplasm of respiratory organs: Secondary | ICD-10-CM

## 2023-05-27 DIAGNOSIS — Z87891 Personal history of nicotine dependence: Secondary | ICD-10-CM

## 2023-05-27 DIAGNOSIS — F17210 Nicotine dependence, cigarettes, uncomplicated: Secondary | ICD-10-CM

## 2023-05-29 ENCOUNTER — Other Ambulatory Visit: Payer: Self-pay | Admitting: Family Medicine

## 2023-05-29 DIAGNOSIS — Z1211 Encounter for screening for malignant neoplasm of colon: Secondary | ICD-10-CM

## 2023-05-29 DIAGNOSIS — Z1212 Encounter for screening for malignant neoplasm of rectum: Secondary | ICD-10-CM

## 2023-06-15 ENCOUNTER — Other Ambulatory Visit: Payer: Self-pay

## 2023-06-15 DIAGNOSIS — F17210 Nicotine dependence, cigarettes, uncomplicated: Secondary | ICD-10-CM

## 2023-06-15 DIAGNOSIS — Z122 Encounter for screening for malignant neoplasm of respiratory organs: Secondary | ICD-10-CM

## 2023-06-15 DIAGNOSIS — Z87891 Personal history of nicotine dependence: Secondary | ICD-10-CM

## 2023-06-29 ENCOUNTER — Ambulatory Visit (HOSPITAL_COMMUNITY)
Admission: RE | Admit: 2023-06-29 | Discharge: 2023-06-29 | Disposition: A | Discharge status: Home / Self Care | Payer: 59 | Source: Ambulatory Visit | Attending: Family Medicine | Admitting: Family Medicine

## 2023-06-29 ENCOUNTER — Ambulatory Visit: Payer: 59 | Admitting: Family Medicine

## 2023-06-29 ENCOUNTER — Other Ambulatory Visit (HOSPITAL_COMMUNITY)
Admission: RE | Admit: 2023-06-29 | Discharge: 2023-06-29 | Disposition: A | Discharge status: Home / Self Care | Payer: 59 | Source: Ambulatory Visit | Attending: Family Medicine | Admitting: Family Medicine

## 2023-06-29 ENCOUNTER — Telehealth: Payer: Self-pay | Admitting: Family Medicine

## 2023-06-29 ENCOUNTER — Encounter (HOSPITAL_COMMUNITY): Payer: Self-pay | Admitting: Radiology

## 2023-06-29 ENCOUNTER — Ambulatory Visit (HOSPITAL_BASED_OUTPATIENT_CLINIC_OR_DEPARTMENT_OTHER): Admission: RE | Admit: 2023-06-29 | Payer: 59 | Source: Ambulatory Visit

## 2023-06-29 VITALS — BP 112/79 | HR 90 | Temp 97.9°F | Ht 67.0 in | Wt 216.2 lb

## 2023-06-29 DIAGNOSIS — R300 Dysuria: Secondary | ICD-10-CM | POA: Insufficient documentation

## 2023-06-29 DIAGNOSIS — R1030 Lower abdominal pain, unspecified: Secondary | ICD-10-CM | POA: Diagnosis present

## 2023-06-29 LAB — CBC WITH DIFFERENTIAL/PLATELET
Abs Immature Granulocytes: 0.04 10*3/uL (ref 0.00–0.07)
Basophils Absolute: 0 10*3/uL (ref 0.0–0.1)
Basophils Relative: 0 %
Eosinophils Absolute: 0.1 10*3/uL (ref 0.0–0.5)
Eosinophils Relative: 1 %
HCT: 41.3 % (ref 36.0–46.0)
Hemoglobin: 13.7 g/dL (ref 12.0–15.0)
Immature Granulocytes: 0 %
Lymphocytes Relative: 22 %
Lymphs Abs: 2.6 10*3/uL (ref 0.7–4.0)
MCH: 30.9 pg (ref 26.0–34.0)
MCHC: 33.2 g/dL (ref 30.0–36.0)
MCV: 93 fL (ref 80.0–100.0)
Monocytes Absolute: 1 10*3/uL (ref 0.1–1.0)
Monocytes Relative: 8 %
Neutro Abs: 7.8 10*3/uL — ABNORMAL HIGH (ref 1.7–7.7)
Neutrophils Relative %: 69 %
Platelets: 137 10*3/uL — ABNORMAL LOW (ref 150–400)
RBC: 4.44 MIL/uL (ref 3.87–5.11)
RDW: 12.5 % (ref 11.5–15.5)
WBC: 11.6 10*3/uL — ABNORMAL HIGH (ref 4.0–10.5)
nRBC: 0 % (ref 0.0–0.2)

## 2023-06-29 LAB — POCT URINALYSIS DIP (CLINITEK)
Bilirubin, UA: NEGATIVE
Glucose, UA: NEGATIVE mg/dL
Ketones, POC UA: NEGATIVE mg/dL
Leukocytes, UA: NEGATIVE
Nitrite, UA: NEGATIVE
POC PROTEIN,UA: NEGATIVE
Spec Grav, UA: <=1.005 — AB (ref 1.010–1.025)
Urobilinogen, UA: 0.2 U/dL
pH, UA: 6 (ref 5.0–8.0)

## 2023-06-29 LAB — LIPASE, BLOOD: Lipase: 27 U/L (ref 11–51)

## 2023-06-29 LAB — BASIC METABOLIC PANEL
Anion gap: 9 (ref 5–15)
BUN: 13 mg/dL (ref 6–20)
CO2: 23 mmol/L (ref 22–32)
Calcium: 9.2 mg/dL (ref 8.9–10.3)
Chloride: 104 mmol/L (ref 98–111)
Creatinine, Ser: 0.83 mg/dL (ref 0.44–1.00)
GFR, Estimated: >60 mL/min (ref >60)
Glucose, Bld: 98 mg/dL (ref 70–99)
Potassium: 3.4 mmol/L — ABNORMAL LOW (ref 3.5–5.1)
Sodium: 136 mmol/L (ref 135–145)

## 2023-06-29 LAB — HEPATIC FUNCTION PANEL
ALT: 53 U/L — ABNORMAL HIGH (ref 0–44)
AST: 68 U/L — ABNORMAL HIGH (ref 15–41)
Albumin: 4 g/dL (ref 3.5–5.0)
Alkaline Phosphatase: 102 U/L (ref 38–126)
Bilirubin, Direct: 0.4 mg/dL — ABNORMAL HIGH (ref 0.0–0.2)
Indirect Bilirubin: 1.4 mg/dL — ABNORMAL HIGH (ref 0.3–0.9)
Total Bilirubin: 1.8 mg/dL — ABNORMAL HIGH (ref <1.2)
Total Protein: 7.6 g/dL (ref 6.5–8.1)

## 2023-06-29 MED ORDER — ONDANSETRON HCL 8 MG PO TABS: 8.0000 mg | ORAL_TABLET | Freq: Three times a day (TID) | ORAL | 0 refills | Status: DC | PRN

## 2023-06-29 MED ORDER — IOHEXOL 300 MG/ML  SOLN
100.0000 mL | Freq: Once | INTRAMUSCULAR | Status: AC | PRN
  Administered 2023-06-29: 100 mL via INTRAVENOUS

## 2023-06-29 MED ORDER — CIPROFLOXACIN HCL 500 MG PO TABS: 500.0000 mg | ORAL_TABLET | Freq: Two times a day (BID) | ORAL | 0 refills | Status: AC

## 2023-06-29 MED ORDER — METRONIDAZOLE 500 MG PO TABS: 500.0000 mg | ORAL_TABLET | Freq: Three times a day (TID) | ORAL | 0 refills | Status: AC

## 2023-06-29 NOTE — Addendum Note (Signed)
Addended by: Lilyan Punt A on: 06/29/2023 08:35 PM   Modules accepted: Orders

## 2023-06-29 NOTE — Progress Notes (Addendum)
   Subjective:    Patient ID: Brandy Hensley, female    DOB: 1971-02-21, 52 y.o.   MRN: [id number]  HPI Patient with lower pelvic pain and discomfort and lower abdominal pain and discomfort present over the past couple days at first she stated when she would urinate she felt pain but she denied true dysuria no hematuria no hematochezia.  Some mild constipation. No fever or chills Progressive abdominal pain and discomfort over the past 24 hours severe during the night and severe this morning hurts to move hurts to sit Slight decrease in appetite Did have chills denies sweats   Review of Systems     Objective:   Physical Exam Lungs clear respiratory rate normal heart is regular Patient does appear to be in discomfort Abdomen mild epigastric right lower quadrant right mid quadrant left lower quadrant left mid quadrant and lower abdominal tenderness to palpation worse on the left side  Patient needs assistance with laying down and sitting up because of the pain     Assessment & Plan:  Onset of acute abdominal pain Possibility of diverticulitis Cannot rule out the possibility of appendicitis or abscess Urine under the microscope did not show any abnormal cells unlikely to be UTI Unlikely to be kidney stone because no hematuria Needs stat workup Lab work ordered CT scan abdomen pelvis with contrast ordered  Results of lab work shows a slightly elevated white blood count Result of the CAT scan did not come back until approximately 730 after pharmacies are closing up for the day I was able to see this result at 8:15 PM the result was called to the patient.  Diverticulitis.  No abscess.  There are medical studies that state supportive measures is a reasonable approach for diverticulitis but given the significant tenderness as well as slight elevation of white blood count and liver enzymes I would recommend going forward with the treatment patient was talked to regarding this. She was told  that if she starts experiencing fever progressive pain vomiting to immediately go to emergency department  She was also instructed to do a follow-up visit with Dr. Adriana Simas on Friday at 10 AM follow-up sooner if any other troubles

## 2023-06-29 NOTE — Telephone Encounter (Signed)
I did discuss case with patient  Brandy Hensley-please set her up for a follow-up office visit diverticulitis with Dr. Adriana Simas on Friday at 10 AM-patient aware

## 2023-07-02 ENCOUNTER — Ambulatory Visit: Payer: 59 | Admitting: Family Medicine

## 2023-07-03 ENCOUNTER — Ambulatory Visit: Payer: 59 | Admitting: Family Medicine

## 2023-07-03 ENCOUNTER — Encounter: Payer: Self-pay | Admitting: Family Medicine

## 2023-07-03 VITALS — BP 109/71 | HR 89 | Temp 97.7°F | Ht 67.0 in | Wt 216.0 lb

## 2023-07-03 DIAGNOSIS — K5792 Diverticulitis of intestine, part unspecified, without perforation or abscess without bleeding: Secondary | ICD-10-CM | POA: Insufficient documentation

## 2023-07-03 NOTE — Progress Notes (Signed)
Subjective:  Patient ID: Brandy Hensley, female    DOB: 06-10-1971  Age: 52 y.o. MRN: [id number]  CC:   Chief Complaint  Patient presents with   follow up diverticulitis    Pain has improved to intermittent twinges   toruble sleeping     Tosses and turns at night feeling tense, irritated, and has headaches    HPI:  52 year old female presents for follow-up regarding recent diverticulitis.  Patient reports that her abdominal pain is improved.  However, she states that she feels like she is having adverse side effects from the antibiotics.  She states that she feels tense.  She is having headaches and some difficulty sleeping.  No current diarrhea.  No fever.  No other associated symptoms.  No other complaints.  Patient Active Problem List   Diagnosis Date Noted   Diverticulitis 07/03/2023   Tobacco use 02/04/2023   Cervical radiculopathy 02/13/2022   DOE (dyspnea on exertion) 12/22/2018   COPD GOLD II (barely)  but MZ/ smoker 12/22/2018   Irritable bowel syndrome with diarrhea 01/15/2017   Hyperlipidemia 07/05/2015   GERD (gastroesophageal reflux disease) 10/31/2013    Social Hx   Social History   Socioeconomic History   Marital status: Divorced    Spouse name: Not on file   Number of children: Not on file   Years of education: Not on file   Highest education level: Not on file  Occupational History   Not on file  Tobacco Use   Smoking status: Every Day    Current packs/day: 0.50    Average packs/day: 0.5 packs/day for 31.0 years (15.5 ttl pk-yrs)    Types: Cigarettes   Smokeless tobacco: Never  Vaping Use   Vaping status: Never Used  Substance and Sexual Activity   Alcohol use: No    Alcohol/week: 0.0 standard drinks of alcohol   Drug use: No   Sexual activity: Yes    Birth control/protection: Surgical  Other Topics Concern   Not on file  Social History Narrative   Not on file   Social Determinants of Health   Financial Resource Strain: Not on file  Food  Insecurity: Not on file  Transportation Needs: Not on file  Physical Activity: Not on file  Stress: Not on file  Social Connections: Not on file    Review of Systems Per HPI  Objective:  BP 109/71   Pulse 89   Temp 97.7 F (36.5 C)   Ht 5\' 7"  (1.702 m)   Wt 216 lb (98 kg)   LMP 11/01/2018 (Within Weeks) Comment: perimenopausal  SpO2 96%   BMI 33.83 kg/m      07/03/2023   10:12 AM 06/29/2023   11:32 AM 04/30/2023   10:39 AM  BP/Weight  Systolic BP 109 112 114  Diastolic BP 71 79 80  Wt. (Lbs) 216 216.2 217.2  BMI 33.83 kg/m2 33.86 kg/m2 34.02 kg/m2    Physical Exam Vitals and nursing note reviewed.  Constitutional:      General: She is not in acute distress.    Appearance: Normal appearance. She is obese.  Cardiovascular:     Rate and Rhythm: Normal rate and regular rhythm.  Pulmonary:     Effort: Pulmonary effort is normal.     Breath sounds: Normal breath sounds.  Abdominal:     General: There is no distension.     Palpations: Abdomen is soft.     Tenderness: There is no abdominal tenderness.  Neurological:  Mental Status: She is alert.     Lab Results  Component Value Date   WBC 11.6 (H) 06/29/2023   HGB 13.7 06/29/2023   HCT 41.3 06/29/2023   PLT 137 (L) 06/29/2023   GLUCOSE 98 06/29/2023   CHOL 219 (H) 01/29/2023   TRIG 120 01/29/2023   HDL 46 01/29/2023   LDLCALC 151 (H) 01/29/2023   ALT 53 (H) 06/29/2023   AST 68 (H) 06/29/2023   NA 136 06/29/2023   K 3.4 (L) 06/29/2023   CL 104 06/29/2023   CREATININE 0.83 06/29/2023   BUN 13 06/29/2023   CO2 23 06/29/2023   TSH 1.050 01/29/2023     Assessment & Plan:   Problem List Items Addressed This Visit       Other   Diverticulitis - Primary    Improving.  Advised patient that if she can tolerate, I would recommend completing at least 5 days of treatment.  Patient continuing Cipro and Flagyl.      Everlene Other DO [facility]

## 2023-07-03 NOTE — Assessment & Plan Note (Signed)
Improving.  Advised patient that if she can tolerate, I would recommend completing at least 5 days of treatment.  Patient continuing Cipro and Flagyl.

## 2023-12-18 ENCOUNTER — Telehealth: Payer: Self-pay | Admitting: Nurse Practitioner

## 2023-12-18 ENCOUNTER — Ambulatory Visit
Admission: EM | Admit: 2023-12-18 | Discharge: 2023-12-18 | Disposition: A | Discharge status: Home / Self Care | Attending: Nurse Practitioner | Admitting: Nurse Practitioner

## 2023-12-18 ENCOUNTER — Encounter: Payer: Self-pay | Admitting: Nurse Practitioner

## 2023-12-18 ENCOUNTER — Ambulatory Visit: Admitting: Nurse Practitioner

## 2023-12-18 VITALS — BP 119/82 | HR 88 | Temp 98.1°F | Ht 67.0 in | Wt 217.0 lb

## 2023-12-18 DIAGNOSIS — K5792 Diverticulitis of intestine, part unspecified, without perforation or abscess without bleeding: Secondary | ICD-10-CM

## 2023-12-18 DIAGNOSIS — R1031 Right lower quadrant pain: Secondary | ICD-10-CM | POA: Diagnosis not present

## 2023-12-18 DIAGNOSIS — R109 Unspecified abdominal pain: Secondary | ICD-10-CM

## 2023-12-18 LAB — POCT URINALYSIS DIP (MANUAL ENTRY)
Bilirubin, UA: NEGATIVE
Glucose, UA: NEGATIVE mg/dL
Ketones, POC UA: NEGATIVE mg/dL
Leukocytes, UA: NEGATIVE
Nitrite, UA: NEGATIVE
Protein Ur, POC: NEGATIVE mg/dL
Spec Grav, UA: <=1.005 — AB (ref 1.010–1.025)
Urobilinogen, UA: 0.2 U/dL
pH, UA: 5 (ref 5.0–8.0)

## 2023-12-18 MED ORDER — CIPROFLOXACIN HCL 500 MG PO TABS: ORAL_TABLET | ORAL | 0 refills | Status: DC

## 2023-12-18 MED ORDER — METRONIDAZOLE 500 MG PO TABS: 500.0000 mg | ORAL_TABLET | Freq: Three times a day (TID) | ORAL | 0 refills | Status: DC

## 2023-12-18 MED ORDER — ONDANSETRON HCL 8 MG PO TABS: 8.0000 mg | ORAL_TABLET | Freq: Three times a day (TID) | ORAL | 0 refills | Status: DC | PRN

## 2023-12-18 NOTE — ED Provider Notes (Addendum)
 [facility]    CSN: [id number] Arrival date & time: 12/18/23  4098      History   Chief Complaint Chief Complaint  Patient presents with   Abdominal Pain    HPI Brandy Hensley is a 53 y.o. female.   The history is provided by the patient.   Patient presents for complaints of right-sided abdominal pain that started 1 day ago.  Patient states that the pain tends to radiate across her abdomen.  Patient reports she does have a history of diverticulitis and IBS.  She states at present, her pain is 5/10.  She denies fever, chills, nausea, vomiting, diarrhea, or urinary symptoms.  Patient states that she took Advil this morning with some relief of her symptoms.    Past Medical History:  Diagnosis Date   COPD (chronic obstructive pulmonary disease) (HCC)    Diverticulitis    GERD (gastroesophageal reflux disease)    PONV (postoperative nausea and vomiting)     Patient Active Problem List   Diagnosis Date Noted   Diverticulitis 07/03/2023   Tobacco use 02/04/2023   Cervical radiculopathy 02/13/2022   DOE (dyspnea on exertion) 12/22/2018   COPD GOLD II (barely)  but MZ/ smoker 12/22/2018   Irritable bowel syndrome with diarrhea 01/15/2017   Hyperlipidemia 07/05/2015   GERD (gastroesophageal reflux disease) 10/31/2013    Past Surgical History:  Procedure Laterality Date   CHOLECYSTECTOMY  09/10/2012   Procedure: LAPAROSCOPIC CHOLECYSTECTOMY;  Surgeon: Beau Bound, MD;  Location: [facility];  Service: General;  Laterality: N/A;   COLONOSCOPY  2005-2006   Dr.Rehman @[facility]    TUBAL LIGATION      OB History   No obstetric history on file.      Home Medications    Prior to Admission medications   Medication Sig Start Date End Date Taking? Authorizing Provider  acetaminophen  (TYLENOL ) 325 MG tablet Take 650 mg by mouth every 6 (six) hours as needed.    [provider]  albuterol  (PROVENTIL ) (2.5 MG/3ML) 0.083% nebulizer solution Take 3 mLs (2.5 mg total) by  nebulization every 6 (six) hours as needed for wheezing or shortness of breath. 12/10/20   Rodriguez-Southworth, Sylvia, PA-C  albuterol  (VENTOLIN  HFA) 108 (90 Base) MCG/ACT inhaler Inhale 2 puffs into the lungs every 6 (six) hours as needed for wheezing or shortness of breath. 04/24/23   LampteyDonley Furth, MD  cyclobenzaprine  (FLEXERIL ) 10 MG tablet Take 1 tablet (10 mg total) by mouth 3 (three) times daily as needed for muscle spasms. 02/13/22   Cook, Jayce G, DO  Famotidine  (PEPCID  PO) Take 10 mg by mouth daily as needed (Acid after eating dinner).    [provider]  ibuprofen (ADVIL) 200 MG tablet Take 200 mg by mouth every 6 (six) hours as needed.    [provider]  meclizine  (ANTIVERT ) 25 MG tablet Take 1 tablet (25 mg total) by mouth 3 (three) times daily as needed for dizziness. 11/09/20   Glena Landau M, DO  ondansetron  (ZOFRAN ) 8 MG tablet Take 1 tablet (8 mg total) by mouth every 8 (eight) hours as needed for nausea or vomiting. 06/29/23   Bennet Brasil, MD  Varenicline  Tartrate, Starter, (CHANTIX  STARTING MONTH PAK) 0.5 MG X 11 & 1 MG X 42 TBPK Take po as directed. 02/04/23   Derenda Flax, NP    Family History Family History  Problem Relation Age of Onset   Hypertension Mother    Osteoporosis Mother    COPD  Mother        smoked   Heart attack Maternal Grandmother    Cancer Maternal Grandmother 44       pancreatic cancer   Heart disease Maternal Grandmother 52       MI/CABG   Emphysema Father        smoked   Lung cancer Father     Social History Social History   Tobacco Use   Smoking status: Every Day    Current packs/day: 0.50    Average packs/day: 0.5 packs/day for 31.0 years (15.5 ttl pk-yrs)    Types: Cigarettes   Smokeless tobacco: Never  Vaping Use   Vaping status: Never Used  Substance Use Topics   Alcohol use: No    Alcohol/week: 0.0 standard drinks of alcohol   Drug use: No     Allergies   Codeine, Flovent  diskus [fluticasone   furoate], Augmentin  [amoxicillin -pot clavulanate], Contrave  [naltrexone -bupropion  hcl er], and Qsymia  [phentermine -topiramate ]   Review of Systems Review of Systems Per HPI  Physical Exam Triage Vital Signs ED Triage Vitals  Encounter Vitals Group     BP 12/18/23 0854 112/76     Systolic BP Percentile --      Diastolic BP Percentile --      Pulse Rate 12/18/23 0854 82     Resp 12/18/23 0854 16     Temp 12/18/23 0858 97.6 F (36.4 C)     Temp Source 12/18/23 0858 Oral     SpO2 12/18/23 0854 95 %     Weight --      Height --      Head Circumference --      Peak Flow --      Pain Score 12/18/23 0854 5     Pain Loc --      Pain Education --      Exclude from Growth Chart --    No data found.  Updated Vital Signs BP 112/76 (BP Location: Right Arm)   Pulse 82   Temp 97.6 F (36.4 C) (Oral)   Resp 16   LMP 11/01/2018 (Within Weeks) Comment: perimenopausal  SpO2 95%   Visual Acuity Right Eye Distance:   Left Eye Distance:   Bilateral Distance:    Right Eye Near:   Left Eye Near:    Bilateral Near:     Physical Exam Vitals and nursing note reviewed.  Constitutional:      General: She is not in acute distress.    Appearance: She is well-developed.  HENT:     Head: Normocephalic.  Eyes:     Extraocular Movements: Extraocular movements intact.     Pupils: Pupils are equal, round, and reactive to light.  Cardiovascular:     Rate and Rhythm: Normal rate and regular rhythm.     Pulses: Normal pulses.     Heart sounds: Normal heart sounds.  Pulmonary:     Effort: Pulmonary effort is normal. No respiratory distress.     Breath sounds: Normal breath sounds. No stridor. No wheezing, rhonchi or rales.  Abdominal:     General: Bowel sounds are normal.     Palpations: Abdomen is soft.     Tenderness: There is abdominal tenderness in the right upper quadrant.  Musculoskeletal:     Cervical back: Normal range of motion.  Skin:    General: Skin is warm and dry.   Neurological:     General: No focal deficit present.     Mental Status: She is alert and oriented  to person, place, and time.  Psychiatric:        Mood and Affect: Mood normal.        Behavior: Behavior normal.      UC Treatments / Results  Labs (all labs ordered are listed, but only abnormal results are displayed) Labs Reviewed  POCT URINALYSIS DIP (MANUAL ENTRY) - Abnormal; Notable for the following components:      Result Value   Color, UA light yellow (*)    Spec Grav, UA <=1.005 (*)    Blood, UA trace-lysed (*)    All other components within normal limits    EKG   Radiology No results found.  Procedures Procedures (including critical care time)  Medications Ordered in UC Medications - No data to display  Initial Impression / Assessment and Plan / UC Course  I have reviewed the triage vital signs and the nursing notes.  Pertinent labs & imaging results that were available during my care of the patient were reviewed by me and considered in my medical decision making (see chart for details).  On exam, patient with guarding of the right upper quadrant.  Patient also with underlying history of diverticulitis and IBS.  Advised patient that this location does not have same day of lab work nor imaging.  Cannot rule out acute abdomen vs. Diverticulitis. Given patient's current presentation, and exam findings, recommend to follow-up in the emergency department for further evaluation to include possible imaging and lab work.  Patient verbalized understanding, but states that she would not go to the emergency department.  Discussed with patient if symptoms worsen, it is highly recommended that she follow-up.  Patient ambulatory at discharge.   Final Clinical Impressions(s) / UC Diagnoses   Final diagnoses:  None   Discharge Instructions   None    ED Prescriptions   None    PDMP not reviewed this encounter.   Hardy Lia, NP 12/18/23 1014     Leath-Warren, Belen Bowers, NP 12/18/23 1138

## 2023-12-18 NOTE — Discharge Instructions (Signed)
 Go to the emergency department for further evaluation.

## 2023-12-18 NOTE — ED Notes (Signed)
 Patient is being discharged from the Urgent Care and sent to the Emergency Department via private vehicle . Per C.Leath-Warren, patient is in need of higher level of care due to abdominal pain. Patient is aware and verbalizes understanding of plan of care.  Vitals:   12/18/23 0854 12/18/23 0858  BP: 112/76   Pulse: 82   Resp: 16   Temp:  97.6 F (36.4 C)  SpO2: 95%

## 2023-12-18 NOTE — Telephone Encounter (Signed)
 Patient scheduled physical for 6/20 with Orelia Binet and would like labs done

## 2023-12-18 NOTE — Progress Notes (Signed)
 Subjective:    Patient ID: Brandy Hensley, female    DOB: 11-30-1970, 53 y.o.   MRN: [id number]  HPI Brandy Hensley for complaints of possible flareup of her diverticulitis.  Last treated for this in November.  First treated in early 2000's by Dr. Freddy Jain.  Stated her first episode started on the right lower quadrant of the abdomen similar to what she is experiencing today.  States a colonoscopy confirmed that she had diverticulitis.  No fevers.  Mild nausea, no vomiting.  Has slightly loose stools but this is normal for her.  No change.  Normal color.  No blood in her stools.  Symptoms started yesterday slightly better this morning when she took ibuprofen but worse after she ate a small lunch.  Was seen in urgent care earlier today.  Recommended at that time that she go to ED for further workup.  Patient wants to avoid ED or further testing due to costs.   Review of Systems  Constitutional:  Negative for fever.  Respiratory:  Negative for cough, chest tightness and shortness of breath.   Cardiovascular:  Negative for chest pain.  Gastrointestinal:  Positive for abdominal pain, diarrhea and nausea. Negative for blood in stool, constipation and vomiting.       Mild loose stool consistent with her IBS.  No change.   Social History   Tobacco Use   Smoking status: Every Day    Current packs/day: 0.50    Average packs/day: 0.5 packs/day for 31.0 years (15.5 ttl pk-yrs)    Types: Cigarettes   Smokeless tobacco: Never  Vaping Use   Vaping status: Never Used  Substance Use Topics   Alcohol use: No    Alcohol/week: 0.0 standard drinks of alcohol   Drug use: No        Objective:   Physical Exam NAD.  Alert, oriented.  Lungs clear.  Heart regular rate rhythm.  Abdomen soft nondistended with active bowel sounds x 4.  Distinct tenderness noted over the right lower quadrant of the abdomen particularly the outer part.  Guarding noted.  No obvious masses but exam limited due to tenderness.  Abdominal exam  otherwise unremarkable.  Discomfort noted when patient has to get on and off the exam table. Today's Vitals   12/18/23 1303  BP: 119/82  Pulse: 88  Temp: 98.1 F (36.7 C)  SpO2: 99%  Weight: 217 lb (98.4 kg)  Height: 5\' 7"  (1.702 m)   Body mass index is 33.99 kg/m.        Assessment & Plan:   Problem List Items Addressed This Visit       Other   Diverticulitis - Primary   RLQ abdominal pain   Meds ordered this encounter  Medications   ciprofloxacin  (CIPRO ) 500 MG tablet    Sig: Take one tab po BID    Dispense:  10 tablet    Refill:  0    Supervising Provider:   Charlotta Cook A [[id number]]   metroNIDAZOLE  (FLAGYL ) 500 MG tablet    Sig: Take 1 tablet (500 mg total) by mouth 3 (three) times daily.    Dispense:  15 tablet    Refill:  0    Supervising Provider:   Charlotta Cook A [[id number]]   ondansetron  (ZOFRAN ) 8 MG tablet    Sig: Take 1 tablet (8 mg total) by mouth every 8 (eight) hours as needed for nausea or vomiting.    Dispense:  20 tablet    Refill:  0  Supervising Provider:   Bennet Brasil [phone]   Because patient has had a similar presentation in the past for diverticulitis, will start Cipro  and metronidazole  as directed.  Also refill on Zofran  to have in case of nausea.  Discussed other potential diagnoses including appendicitis or acute abdomen.  Warning signs reviewed.  Patient agrees to go to ED over the weekend if worsening symptoms.  To call back on Monday if no improvement on current regimen. Return if symptoms worsen or fail to improve.

## 2023-12-18 NOTE — ED Triage Notes (Signed)
 Pt states lower right abdominal pain since yesterday states it radiates across her abdomen. States she took Advil this morning with some relief.

## 2023-12-20 ENCOUNTER — Encounter: Payer: Self-pay | Admitting: Nurse Practitioner

## 2023-12-21 ENCOUNTER — Encounter: Payer: Self-pay | Admitting: Nurse Practitioner

## 2023-12-21 ENCOUNTER — Other Ambulatory Visit: Payer: Self-pay | Admitting: Nurse Practitioner

## 2023-12-21 DIAGNOSIS — E876 Hypokalemia: Secondary | ICD-10-CM

## 2023-12-21 DIAGNOSIS — D696 Thrombocytopenia, unspecified: Secondary | ICD-10-CM

## 2023-12-21 DIAGNOSIS — E785 Hyperlipidemia, unspecified: Secondary | ICD-10-CM

## 2023-12-21 DIAGNOSIS — K219 Gastro-esophageal reflux disease without esophagitis: Secondary | ICD-10-CM

## 2023-12-21 NOTE — Telephone Encounter (Signed)
Done.    My chart message sent.

## 2023-12-27 ENCOUNTER — Ambulatory Visit
Admission: EM | Admit: 2023-12-27 | Discharge: 2023-12-27 | Disposition: A | Discharge status: Home / Self Care | Attending: Family Medicine | Admitting: Family Medicine

## 2023-12-27 ENCOUNTER — Other Ambulatory Visit: Payer: Self-pay

## 2023-12-27 ENCOUNTER — Encounter: Payer: Self-pay | Admitting: Emergency Medicine

## 2023-12-27 DIAGNOSIS — J441 Chronic obstructive pulmonary disease with (acute) exacerbation: Secondary | ICD-10-CM

## 2023-12-27 DIAGNOSIS — J069 Acute upper respiratory infection, unspecified: Secondary | ICD-10-CM

## 2023-12-27 MED ORDER — PROMETHAZINE-DM 6.25-15 MG/5ML PO SYRP: 5.0000 mL | ORAL_SOLUTION | Freq: Four times a day (QID) | ORAL | 0 refills | Status: DC | PRN

## 2023-12-27 MED ORDER — AZITHROMYCIN 250 MG PO TABS: ORAL_TABLET | ORAL | 0 refills | Status: DC

## 2023-12-27 MED ORDER — ALBUTEROL SULFATE HFA 108 (90 BASE) MCG/ACT IN AERS: 2.0000 | INHALATION_SPRAY | RESPIRATORY_TRACT | 0 refills | Status: AC | PRN

## 2023-12-27 MED ORDER — PREDNISONE 20 MG PO TABS: 40.0000 mg | ORAL_TABLET | Freq: Every day | ORAL | 0 refills | Status: DC

## 2023-12-27 NOTE — ED Provider Notes (Signed)
 [facility]    CSN: [id number] Arrival date & time: 12/27/23  1403      History   Chief Complaint Chief Complaint  Patient presents with   Nasal Congestion    HPI Brandy Hensley is a 53 y.o. female.   Patient presenting today with about a week of progressively worsening congestion, productive cough, chest tightness, shortness of breath, fatigue.  Denies fever, chills, chest pain, abdominal pain, vomiting, diarrhea.  So far trying Mucinex cold and congestion with minimal relief.  She has tried her albuterol  inhaler for COPD but states it is out of date and did not seem to do much for her.    Past Medical History:  Diagnosis Date   COPD (chronic obstructive pulmonary disease) (HCC)    Diverticulitis    GERD (gastroesophageal reflux disease)    PONV (postoperative nausea and vomiting)     Patient Active Problem List   Diagnosis Date Noted   RLQ abdominal pain 12/18/2023   Diverticulitis 07/03/2023   Tobacco use 02/04/2023   Cervical radiculopathy 02/13/2022   DOE (dyspnea on exertion) 12/22/2018   COPD GOLD II (barely)  but MZ/ smoker 12/22/2018   Irritable bowel syndrome with diarrhea 01/15/2017   Hyperlipidemia 07/05/2015   GERD (gastroesophageal reflux disease) 10/31/2013    Past Surgical History:  Procedure Laterality Date   CHOLECYSTECTOMY  09/10/2012   Procedure: LAPAROSCOPIC CHOLECYSTECTOMY;  Surgeon: Beau Bound, MD;  Location: [facility];  Service: General;  Laterality: N/A;   COLONOSCOPY  2005-2006   Dr.Rehman @[facility]    TUBAL LIGATION      OB History   No obstetric history on file.      Home Medications    Prior to Admission medications   Medication Sig Start Date End Date Taking? Authorizing Provider  albuterol  (VENTOLIN  HFA) 108 (90 Base) MCG/ACT inhaler Inhale 2 puffs into the lungs every 4 (four) hours as needed. 12/27/23  Yes Corbin Dess, PA-C  azithromycin  (ZITHROMAX ) 250 MG tablet Take first 2 tablets together, then 1 every  day until finished. 12/27/23  Yes Corbin Dess, PA-C  predniSONE  (DELTASONE ) 20 MG tablet Take 2 tablets (40 mg total) by mouth daily with breakfast. 12/27/23  Yes Corbin Dess, PA-C  promethazine -dextromethorphan (PROMETHAZINE -DM) 6.25-15 MG/5ML syrup Take 5 mLs by mouth 4 (four) times daily as needed. 12/27/23  Yes Corbin Dess, PA-C  acetaminophen  (TYLENOL ) 325 MG tablet Take 650 mg by mouth every 6 (six) hours as needed.    [provider]  albuterol  (PROVENTIL ) (2.5 MG/3ML) 0.083% nebulizer solution Take 3 mLs (2.5 mg total) by nebulization every 6 (six) hours as needed for wheezing or shortness of breath. 12/10/20   Rodriguez-Southworth, Sylvia, PA-C  albuterol  (VENTOLIN  HFA) 108 (90 Base) MCG/ACT inhaler Inhale 2 puffs into the lungs every 6 (six) hours as needed for wheezing or shortness of breath. 04/24/23   Corine Dice, MD  ciprofloxacin  (CIPRO ) 500 MG tablet Take one tab po BID 12/18/23   Hoskins, Carolyn C, NP  cyclobenzaprine  (FLEXERIL ) 10 MG tablet Take 1 tablet (10 mg total) by mouth 3 (three) times daily as needed for muscle spasms. 02/13/22   Cook, Jayce G, DO  Famotidine  (PEPCID  PO) Take 10 mg by mouth daily as needed (Acid after eating dinner).    [provider]  ibuprofen (ADVIL) 200 MG tablet Take 200 mg by mouth every 6 (six) hours as needed.    [provider]  meclizine  (ANTIVERT ) 25 MG tablet Take  1 tablet (25 mg total) by mouth 3 (three) times daily as needed for dizziness. 11/09/20   Carolynne Citron, Malena M, DO  metroNIDAZOLE  (FLAGYL ) 500 MG tablet Take 1 tablet (500 mg total) by mouth 3 (three) times daily. 12/18/23   Derenda Flax, NP  ondansetron  (ZOFRAN ) 8 MG tablet Take 1 tablet (8 mg total) by mouth every 8 (eight) hours as needed for nausea or vomiting. 12/18/23   Derenda Flax, NP  Varenicline  Tartrate, Starter, (CHANTIX  STARTING MONTH PAK) 0.5 MG X 11 & 1 MG X 42 TBPK Take po as directed. 02/04/23   Derenda Flax, NP    Family History Family History  Problem Relation Age of Onset   Hypertension Mother    Osteoporosis Mother    COPD Mother        smoked   Heart attack Maternal Grandmother    Cancer Maternal Grandmother 65       pancreatic cancer   Heart disease Maternal Grandmother 78       MI/CABG   Emphysema Father        smoked   Lung cancer Father     Social History Social History   Tobacco Use   Smoking status: Every Day    Current packs/day: 0.50    Average packs/day: 0.5 packs/day for 31.0 years (15.5 ttl pk-yrs)    Types: Cigarettes   Smokeless tobacco: Never  Vaping Use   Vaping status: Never Used  Substance Use Topics   Alcohol use: No    Alcohol/week: 0.0 standard drinks of alcohol   Drug use: No     Allergies   Codeine, Flovent  diskus [fluticasone  furoate], Augmentin  [amoxicillin -pot clavulanate], Contrave  [naltrexone -bupropion  hcl er], and Qsymia  [phentermine -topiramate ]   Review of Systems Review of Systems Per HPI  Physical Exam Triage Vital Signs ED Triage Vitals  Encounter Vitals Group     BP 12/27/23 1430 104/74     Systolic BP Percentile --      Diastolic BP Percentile --      Pulse Rate 12/27/23 1430 91     Resp 12/27/23 1430 18     Temp 12/27/23 1430 98.4 F (36.9 C)     Temp Source 12/27/23 1430 Oral     SpO2 12/27/23 1430 96 %     Weight --      Height --      Head Circumference --      Peak Flow --      Pain Score 12/27/23 1431 4     Pain Loc --      Pain Education --      Exclude from Growth Chart --    No data found.  Updated Vital Signs BP 104/74 (BP Location: Right Arm)   Pulse 91   Temp 98.4 F (36.9 C) (Oral)   Resp 18   LMP 11/01/2018 (Within Weeks) Comment: perimenopausal  SpO2 96%   Visual Acuity Right Eye Distance:   Left Eye Distance:   Bilateral Distance:    Right Eye Near:   Left Eye Near:    Bilateral Near:     Physical Exam Vitals and nursing note reviewed.  Constitutional:      Appearance:  Normal appearance.  HENT:     Head: Atraumatic.     Right Ear: Tympanic membrane and external ear normal.     Left Ear: Tympanic membrane and external ear normal.     Nose: Congestion present.     Mouth/Throat:  Mouth: Mucous membranes are moist.     Pharynx: Posterior oropharyngeal erythema present.  Eyes:     Extraocular Movements: Extraocular movements intact.     Conjunctiva/sclera: Conjunctivae normal.  Cardiovascular:     Rate and Rhythm: Normal rate and regular rhythm.     Heart sounds: Normal heart sounds.  Pulmonary:     Effort: Pulmonary effort is normal.     Breath sounds: Wheezing and rales present.  Musculoskeletal:        General: Normal range of motion.     Cervical back: Normal range of motion and neck supple.  Skin:    General: Skin is warm and dry.  Neurological:     Mental Status: She is alert and oriented to person, place, and time.  Psychiatric:        Mood and Affect: Mood normal.        Thought Content: Thought content normal.      UC Treatments / Results  Labs (all labs ordered are listed, but only abnormal results are displayed) Labs Reviewed - No data to display  EKG   Radiology No results found.  Procedures Procedures (including critical care time)  Medications Ordered in UC Medications - No data to display  Initial Impression / Assessment and Plan / UC Course  I have reviewed the triage vital signs and the nursing notes.  Pertinent labs & imaging results that were available during my care of the patient were reviewed by me and considered in my medical decision making (see chart for details).     Will treat for COPD exacerbation secondary to viral respiratory infection with prednisone , Zithromax , albuterol , Phenergan  DM.  Discussed supportive over-the-counter medications and home care additionally.  Return for worsening symptoms.  Final Clinical Impressions(s) / UC Diagnoses   Final diagnoses:  Viral URI with cough  COPD  exacerbation [facility])   Discharge Instructions   None    ED Prescriptions     Medication Sig Dispense Auth. Provider   azithromycin  (ZITHROMAX ) 250 MG tablet Take first 2 tablets together, then 1 every day until finished. 6 tablet Corbin Dess, PA-C   predniSONE  (DELTASONE ) 20 MG tablet Take 2 tablets (40 mg total) by mouth daily with breakfast. 10 tablet Corbin Dess, PA-C   albuterol  (VENTOLIN  HFA) 108 (90 Base) MCG/ACT inhaler Inhale 2 puffs into the lungs every 4 (four) hours as needed. 18 g Corbin Dess, PA-C   promethazine -dextromethorphan (PROMETHAZINE -DM) 6.25-15 MG/5ML syrup Take 5 mLs by mouth 4 (four) times daily as needed. 100 mL Corbin Dess, [place]      PDMP not reviewed this encounter.   Corbin Dess, [place] 12/27/23 1510

## 2023-12-27 NOTE — ED Triage Notes (Addendum)
 Pt reports nasal congestion, cough,chills for last several days and reports "its going into my chest now".

## 2023-12-30 ENCOUNTER — Ambulatory Visit: Admitting: Family Medicine

## 2023-12-30 ENCOUNTER — Ambulatory Visit (HOSPITAL_COMMUNITY)
Admission: RE | Admit: 2023-12-30 | Discharge: 2023-12-30 | Disposition: A | Discharge status: Home / Self Care | Source: Ambulatory Visit | Attending: Family Medicine | Admitting: Family Medicine

## 2023-12-30 VITALS — BP 110/64 | HR 75 | Temp 97.0°F | Ht 67.0 in | Wt 214.0 lb

## 2023-12-30 DIAGNOSIS — J189 Pneumonia, unspecified organism: Secondary | ICD-10-CM | POA: Insufficient documentation

## 2023-12-30 DIAGNOSIS — R051 Acute cough: Secondary | ICD-10-CM | POA: Diagnosis present

## 2023-12-30 MED ORDER — CEFDINIR 300 MG PO CAPS
300.0000 mg | ORAL_CAPSULE | Freq: Two times a day (BID) | ORAL | 0 refills | Status: DC
Start: 2023-12-30 — End: 2024-02-12

## 2023-12-30 MED ORDER — DOXYCYCLINE HYCLATE 100 MG PO TABS: 100.0000 mg | ORAL_TABLET | Freq: Two times a day (BID) | ORAL | 0 refills | Status: DC

## 2023-12-30 MED ORDER — ALBUTEROL SULFATE (2.5 MG/3ML) 0.083% IN NEBU: 2.5000 mg | INHALATION_SOLUTION | Freq: Four times a day (QID) | RESPIRATORY_TRACT | 12 refills | Status: AC | PRN

## 2023-12-30 MED ORDER — FLUCONAZOLE 150 MG PO TABS: 150.0000 mg | ORAL_TABLET | Freq: Once | ORAL | 0 refills | Status: AC

## 2023-12-30 NOTE — Assessment & Plan Note (Signed)
 X-ray was obtained and revealed pneumonia.  Placing on Omnicef  and doxycycline .  Patient requested Diflucan  in case she develops yeast infection.  Rx sent.

## 2023-12-30 NOTE — Patient Instructions (Signed)
 Albuterol  as directed.  Chest xray today.  We will call with results.  Take care  Dr. Debrah Fan

## 2023-12-30 NOTE — Progress Notes (Signed)
 Subjective:  Patient ID: Brandy Hensley, female    DOB: 01-29-1971  Age: 53 y.o. MRN: [id number]  CC:   Chief Complaint  Patient presents with   cough and congestion    Follow up from urgent care will complete z pack tomorrow still very congested in her chest not able to loosen congestion     HPI:  53 year old female presents for evaluation of the above.  Ongoing cough for the past 1.5 weeks.  Saw urgent care on 5/4.  Was placed on prednisone  and azithromycin .  Continues to feel poorly.  Continues to have significant cough and congestion.  Cough is productive.  She has had no improvement with treatment.  No documented fever.  Patient Active Problem List   Diagnosis Date Noted   Community acquired pneumonia 12/30/2023   RLQ abdominal pain 12/18/2023   Diverticulitis 07/03/2023   Tobacco use 02/04/2023   Cervical radiculopathy 02/13/2022   DOE (dyspnea on exertion) 12/22/2018   COPD GOLD II (barely)  but MZ/ smoker 12/22/2018   Irritable bowel syndrome with diarrhea 01/15/2017   Hyperlipidemia 07/05/2015   GERD (gastroesophageal reflux disease) 10/31/2013    Social Hx   Social History   Socioeconomic History   Marital status: Divorced    Spouse name: Not on file   Number of children: Not on file   Years of education: Not on file   Highest education level: Not on file  Occupational History   Not on file  Tobacco Use   Smoking status: Every Day    Current packs/day: 0.50    Average packs/day: 0.5 packs/day for 31.0 years (15.5 ttl pk-yrs)    Types: Cigarettes   Smokeless tobacco: Never  Vaping Use   Vaping status: Never Used  Substance and Sexual Activity   Alcohol use: No    Alcohol/week: 0.0 standard drinks of alcohol   Drug use: No   Sexual activity: Yes    Birth control/protection: Surgical  Other Topics Concern   Not on file  Social History Narrative   Not on file   Social Drivers of Health   Financial Resource Strain: Not on file  Food Insecurity: Not  on file  Transportation Needs: Not on file  Physical Activity: Not on file  Stress: Not on file  Social Connections: Not on file    Review of Systems Per HPI  Objective:  BP 110/64   Pulse 75   Temp (!) 97 F (36.1 C)   Ht 5\' 7"  (1.702 m)   Wt 214 lb (97.1 kg)   LMP 11/01/2018 (Within Weeks) Comment: perimenopausal  SpO2 98%   BMI 33.52 kg/m      12/30/2023   10:56 AM 12/27/2023    2:30 PM 12/18/2023    1:03 PM  BP/Weight  Systolic BP 110 104 119  Diastolic BP 64 74 82  Wt. (Lbs) 214  217  BMI 33.52 kg/m2  33.99 kg/m2    Physical Exam Constitutional:      General: She is not in acute distress.    Appearance: Normal appearance.  HENT:     Head: Normocephalic and atraumatic.  Cardiovascular:     Rate and Rhythm: Normal rate and regular rhythm.  Pulmonary:     Effort: Pulmonary effort is normal.     Comments: Coarse breath sounds. Neurological:     Mental Status: She is alert.     Lab Results  Component Value Date   WBC 11.6 (H) 06/29/2023   HGB 13.7 06/29/2023  HCT 41.3 06/29/2023   PLT 137 (L) 06/29/2023   GLUCOSE 98 06/29/2023   CHOL 219 (H) 01/29/2023   TRIG 120 01/29/2023   HDL 46 01/29/2023   LDLCALC 151 (H) 01/29/2023   ALT 53 (H) 06/29/2023   AST 68 (H) 06/29/2023   NA 136 06/29/2023   K 3.4 (L) 06/29/2023   CL 104 06/29/2023   CREATININE 0.83 06/29/2023   BUN 13 06/29/2023   CO2 23 06/29/2023   TSH 1.050 01/29/2023     Assessment & Plan:  Community acquired pneumonia of left lower lobe of lung Assessment & Plan: X-ray was obtained and revealed pneumonia.  Placing on Omnicef  and doxycycline .  Patient requested Diflucan  in case she develops yeast infection.  Rx sent.  Orders: -     Albuterol  Sulfate; Take 3 mLs (2.5 mg total) by nebulization every 6 (six) hours as needed for wheezing or shortness of breath.  Dispense: 75 mL; Refill: 12 -     Cefdinir ; Take 1 capsule (300 mg total) by mouth 2 (two) times daily.  Dispense: 14 capsule;  Refill: 0 -     Doxycycline  Hyclate; Take 1 tablet (100 mg total) by mouth 2 (two) times daily. Take with food and water.  Dispense: 14 tablet; Refill: 0  Acute cough -     DG Chest 2 View  Other orders -     Fluconazole ; Take 1 tablet (150 mg total) by mouth once for 1 dose. Repeat dose in 72 hours.  Dispense: 2 tablet; Refill: 0    Follow-up:  Return if symptoms worsen or fail to improve.  Kathleen Papa DO [facility]

## 2024-01-30 LAB — CBC WITH DIFFERENTIAL/PLATELET
Basophils Absolute: 0.1 10*3/uL (ref 0.0–0.2)
Basos: 1 %
EOS (ABSOLUTE): 0.2 10*3/uL (ref 0.0–0.4)
Eos: 3 %
Hematocrit: 46.1 % (ref 34.0–46.6)
Hemoglobin: 15.1 g/dL (ref 11.1–15.9)
Immature Grans (Abs): 0 10*3/uL (ref 0.0–0.1)
Immature Granulocytes: 0 %
Lymphocytes Absolute: 2.7 10*3/uL (ref 0.7–3.1)
Lymphs: 35 %
MCH: 31.1 pg (ref 26.6–33.0)
MCHC: 32.8 g/dL (ref 31.5–35.7)
MCV: 95 fL (ref 79–97)
Monocytes Absolute: 0.5 10*3/uL (ref 0.1–0.9)
Monocytes: 6 %
Neutrophils Absolute: 4.1 10*3/uL (ref 1.4–7.0)
Neutrophils: 55 %
Platelets: 186 10*3/uL (ref 150–450)
RBC: 4.85 x10E6/uL (ref 3.77–5.28)
RDW: 12.4 % (ref 11.7–15.4)
WBC: 7.6 10*3/uL (ref 3.4–10.8)

## 2024-01-30 LAB — COMPREHENSIVE METABOLIC PANEL WITH GFR
ALT: 21 IU/L (ref 0–32)
AST: 25 IU/L (ref 0–40)
Albumin: 4.4 g/dL (ref 3.8–4.9)
Alkaline Phosphatase: 105 IU/L (ref 44–121)
BUN/Creatinine Ratio: 10 (ref 9–23)
BUN: 10 mg/dL (ref 6–24)
Bilirubin Total: 0.7 mg/dL (ref 0.0–1.2)
CO2: 21 mmol/L (ref 20–29)
Calcium: 9.6 mg/dL (ref 8.7–10.2)
Chloride: 104 mmol/L (ref 96–106)
Creatinine, Ser: 0.99 mg/dL (ref 0.57–1.00)
Globulin, Total: 2.4 g/dL (ref 1.5–4.5)
Glucose: 131 mg/dL — ABNORMAL HIGH (ref 70–99)
Potassium: 4.3 mmol/L (ref 3.5–5.2)
Sodium: 143 mmol/L (ref 134–144)
Total Protein: 6.8 g/dL (ref 6.0–8.5)
eGFR: 69 mL/min/{1.73_m2} (ref >59)

## 2024-01-30 LAB — LIPID PANEL
Chol/HDL Ratio: 4.2 ratio (ref 0.0–4.4)
Cholesterol, Total: 215 mg/dL — ABNORMAL HIGH (ref 100–199)
HDL: 51 mg/dL (ref >39)
LDL Chol Calc (NIH): 145 mg/dL — ABNORMAL HIGH (ref 0–99)
Triglycerides: 108 mg/dL (ref 0–149)
VLDL Cholesterol Cal: 19 mg/dL (ref 5–40)

## 2024-02-12 ENCOUNTER — Ambulatory Visit (INDEPENDENT_AMBULATORY_CARE_PROVIDER_SITE_OTHER): Admitting: Nurse Practitioner

## 2024-02-12 ENCOUNTER — Encounter: Payer: Self-pay | Admitting: Nurse Practitioner

## 2024-02-12 VITALS — BP 120/82 | HR 83 | Temp 97.2°F | Ht 67.0 in | Wt 216.0 lb

## 2024-02-12 DIAGNOSIS — Z01411 Encounter for gynecological examination (general) (routine) with abnormal findings: Secondary | ICD-10-CM | POA: Diagnosis not present

## 2024-02-12 DIAGNOSIS — N9089 Other specified noninflammatory disorders of vulva and perineum: Secondary | ICD-10-CM | POA: Diagnosis not present

## 2024-02-12 DIAGNOSIS — R0609 Other forms of dyspnea: Secondary | ICD-10-CM | POA: Diagnosis not present

## 2024-02-12 DIAGNOSIS — Z01419 Encounter for gynecological examination (general) (routine) without abnormal findings: Secondary | ICD-10-CM

## 2024-02-12 DIAGNOSIS — J300 Vasomotor rhinitis: Secondary | ICD-10-CM

## 2024-02-12 DIAGNOSIS — E785 Hyperlipidemia, unspecified: Secondary | ICD-10-CM

## 2024-02-12 MED ORDER — KETOCONAZOLE 2 % EX CREA: 1.0000 | TOPICAL_CREAM | Freq: Two times a day (BID) | CUTANEOUS | 0 refills | Status: AC

## 2024-02-12 NOTE — Progress Notes (Signed)
 Subjective:    Patient ID: Brandy Hensley, female    DOB: 1970-08-27, 53 y.o.   MRN: [id number]  HPI The patient comes in today for a wellness visit.    A review of their health history was completed.  A review of medications was also completed.  Any needed refills; none  Eating habits: example of diet: 2 boiled eggs in the am; 1/2 ham sandwich with chips for lunch then salad with side for supper. Has noticed certain foods cause a pressure in the back of her head; mainly identified [employer] pastry and [employer] subs; headache will last for a few hours then resolve.   Falls/  MVA accidents in past few months: none  Regular exercise: limited  Specialist pt sees on regular basis: Pulmonology; gets regular lung cancer screening  Preventative health issues were discussed.   Additional concerns:  No new sexual partners.  No vaginal bleeding or menstrual cycle for the past 2 years.  Had a visual exam yesterday.  Regular dental care.  Wears incontinent pads for urinary incontinence.  Has occasional irritation and slight itching in the labial area which she thinks is due to heat and moisture. Also complaints of chronic pressure in the right maxillary sinus.  States it always feels stopped up but varies in intensity.  Has not tried anything. Works from home. Since COVID, patient has become more socially isolated.     Review of Systems  Constitutional:  Positive for fatigue. Negative for activity change and appetite change.  HENT:  Positive for congestion and sinus pressure. Negative for ear pain, sore throat and trouble swallowing.   Respiratory:  Positive for shortness of breath. Negative for cough, chest tightness and wheezing.        No unusual SOB. States she has been inactive which affects her breathing with activity.   Cardiovascular:  Negative for chest pain.  Gastrointestinal:  Positive for constipation and diarrhea. Negative for abdominal distention, abdominal pain, blood in  stool, nausea and vomiting.       Can have alternating cycles of diarrhea and constipation with her IBS.  Genitourinary:  Positive for enuresis, frequency and urgency. Negative for difficulty urinating, dysuria, genital sores, pelvic pain, vaginal bleeding and vaginal discharge.  Skin:  Positive for rash.  Neurological:  Positive for headaches. Negative for facial asymmetry, speech difficulty, weakness and numbness.      02/12/2024    1:12 PM  Depression screen PHQ 2/9  Decreased Interest 1  Down, Depressed, Hopeless 1  PHQ - 2 Score 2  Altered sleeping 1  Tired, decreased energy 1  Change in appetite 0  Feeling bad or failure about yourself  0  Trouble concentrating 0  Moving slowly or fidgety/restless 0  Suicidal thoughts 0  PHQ-9 Score 4  Difficult doing work/chores Not difficult at all      02/12/2024    1:12 PM 12/30/2023   11:01 AM 12/18/2023    1:10 PM 07/03/2023   10:15 AM  GAD 7 : Generalized Anxiety Score  Nervous, Anxious, on Edge 1 1 1  0  Control/stop worrying 0 0 0 0  Worry too much - different things 0 1 0 0  Trouble relaxing 0 1 1 0  Restless 0 0 0 0  Easily annoyed or irritable 1 1 1  0  Afraid - awful might happen 0 0 0 0  Total GAD 7 Score 2 4 3  0  Anxiety Difficulty Not difficult at all Not difficult at all Not  difficult at all Not difficult at all    Social History   Tobacco Use   Smoking status: Every Day    Current packs/day: 0.50    Average packs/day: 0.5 packs/day for 31.0 years (15.5 ttl pk-yrs)    Types: Cigarettes   Smokeless tobacco: Never  Vaping Use   Vaping status: Never Used  Substance Use Topics   Alcohol use: No    Alcohol/week: 0.0 standard drinks of alcohol   Drug use: No        Objective:   Physical Exam Vitals and nursing note reviewed. Chaperone present: Defers chaperone..  Constitutional:      General: She is not in acute distress.    Appearance: She is well-developed.  HENT:     Ears:     Comments: TMs retracted  bilaterally, no erythema.    Mouth/Throat:     Mouth: Mucous membranes are moist.     Pharynx: Oropharynx is clear.  Neck:     Thyroid : No thyromegaly.     Trachea: No tracheal deviation.     Comments: Thyroid  non tender to palpation. No mass or goiter noted.  Cardiovascular:     Rate and Rhythm: Normal rate and regular rhythm.     Heart sounds: Normal heart sounds. No murmur heard. Pulmonary:     Effort: Pulmonary effort is normal.     Breath sounds: Normal breath sounds.  Chest:  Breasts:    Right: No swelling, inverted nipple, mass, skin change or tenderness.     Left: No swelling, inverted nipple, mass, skin change or tenderness.  Abdominal:     General: There is no distension.     Palpations: Abdomen is soft. There is no mass.     Tenderness: There is no abdominal tenderness. There is no guarding or rebound.  Genitourinary:    Comments: External GU: vulvar area: mild irritation. No lesions. Outer labia bilaterally: mild irritation and dry skin. No lesions.  Musculoskeletal:     Cervical back: Normal range of motion and neck supple.  Lymphadenopathy:     Cervical: No cervical adenopathy.     Upper Body:     Right upper body: No supraclavicular, axillary or pectoral adenopathy.     Left upper body: No supraclavicular, axillary or pectoral adenopathy.   Skin:    General: Skin is warm and dry.     Findings: No rash.   Neurological:     Mental Status: She is alert and oriented to person, place, and time.   Psychiatric:        Mood and Affect: Mood normal.        Behavior: Behavior normal.        Thought Content: Thought content normal.        Judgment: Judgment normal.    Today's Vitals   02/12/24 1307  BP: 120/82  Pulse: 83  Temp: (!) 97.2 F (36.2 C)  SpO2: 98%  Weight: 216 lb (98 kg)  Height: 5' 7 (1.702 m)   Body mass index is 33.83 kg/m.   Results for orders placed or performed in visit on 12/21/23  CBC with Differential/Platelet   Collection Time:  01/29/24  8:41 AM  Result Value Ref Range   WBC 7.6 3.4 - 10.8 x10E3/uL   RBC 4.85 3.77 - 5.28 x10E6/uL   Hemoglobin 15.1 11.1 - 15.9 g/dL   Hematocrit 13.0 86.5 - 46.6 %   MCV 95 79 - 97 fL   MCH 31.1 26.6 - 33.0 pg  MCHC 32.8 31.5 - 35.7 g/dL   RDW 16.1 09.6 - 04.5 %   Platelets 186 150 - 450 x10E3/uL   Neutrophils 55 Not Estab. %   Lymphs 35 Not Estab. %   Monocytes 6 Not Estab. %   Eos 3 Not Estab. %   Basos 1 Not Estab. %   Neutrophils Absolute 4.1 1.4 - 7.0 x10E3/uL   Lymphocytes Absolute 2.7 0.7 - 3.1 x10E3/uL   Monocytes Absolute 0.5 0.1 - 0.9 x10E3/uL   EOS (ABSOLUTE) 0.2 0.0 - 0.4 x10E3/uL   Basophils Absolute 0.1 0.0 - 0.2 x10E3/uL   Immature Granulocytes 0 Not Estab. %   Immature Grans (Abs) 0.0 0.0 - 0.1 x10E3/uL  Comprehensive metabolic panel with GFR   Collection Time: 01/29/24  8:41 AM  Result Value Ref Range   Glucose 131 (H) 70 - 99 mg/dL   BUN 10 6 - 24 mg/dL   Creatinine, Ser 4.09 0.57 - 1.00 mg/dL   eGFR 69 >81 XB/JYN/8.29   BUN/Creatinine Ratio 10 9 - 23   Sodium 143 134 - 144 mmol/L   Potassium 4.3 3.5 - 5.2 mmol/L   Chloride 104 96 - 106 mmol/L   CO2 21 20 - 29 mmol/L   Calcium  9.6 8.7 - 10.2 mg/dL   Total Protein 6.8 6.0 - 8.5 g/dL   Albumin 4.4 3.8 - 4.9 g/dL   Globulin, Total 2.4 1.5 - 4.5 g/dL   Bilirubin Total 0.7 0.0 - 1.2 mg/dL   Alkaline Phosphatase 105 44 - 121 IU/L   AST 25 0 - 40 IU/L   ALT 21 0 - 32 IU/L  Lipid panel   Collection Time: 01/29/24  8:41 AM  Result Value Ref Range   Cholesterol, Total 215 (H) 100 - 199 mg/dL   Triglycerides 562 0 - 149 mg/dL   HDL 51 >13 mg/dL   VLDL Cholesterol Cal 19 5 - 40 mg/dL   LDL Chol Calc (NIH) 086 (H) 0 - 99 mg/dL   Chol/HDL Ratio 4.2 0.0 - 4.4 ratio   Reviewed labs with patient during visit.      Assessment & Plan:   Problem List Items Addressed This Visit       Respiratory   Vasomotor rhinitis     Other   DOE (dyspnea on exertion)   Relevant Orders   CT CARDIAC SCORING  (SELF PAY ONLY)   Hyperlipidemia   Relevant Orders   CT CARDIAC SCORING (SELF PAY ONLY)   Labial irritation   Other Visit Diagnoses       Well woman exam    -  Primary        Meds ordered this encounter  Medications   ketoconazole  (NIZORAL ) 2 % cream    Sig: Apply 1 Application topically 2 (two) times daily. Prn rash    Dispense:  30 g    Refill:  0    Supervising Provider:   Charlotta Cook A [[id number]]   Apply ketoconazole  with a small amount of Desitin to external GU area due to exposure to increased moisture.  Call back if persists. Freestyle libre sensor placed in the office with instructions.  Her fasting sugar was elevated on lab work.  It is unclear what is causing these rare headaches.  Patient to monitor her blood sugar and if she has a headache see what the sugar is running at that time.  Warning signs reviewed regarding headaches.  Call back sooner or seek help if any new or worsening symptoms.  Recommend Flonase  nasal spray as directed for head congestion. Discussed options for her cholesterol.  Patient wants to avoid medication for now.  Agrees to CT cardiac calcium  score.  Further follow-up based on results. Discussed healthy diet and increasing activity. Discussed importance of socialization and trying to get out of her house more. Return in about 1 year (around 02/11/2025) for physical.

## 2024-03-18 ENCOUNTER — Ambulatory Visit (HOSPITAL_COMMUNITY)
Admission: RE | Admit: 2024-03-18 | Discharge: 2024-03-18 | Disposition: A | Discharge status: Home / Self Care | Payer: Self-pay | Source: Ambulatory Visit | Attending: Nurse Practitioner | Admitting: Nurse Practitioner

## 2024-03-18 DIAGNOSIS — E785 Hyperlipidemia, unspecified: Secondary | ICD-10-CM | POA: Insufficient documentation

## 2024-03-18 DIAGNOSIS — R0609 Other forms of dyspnea: Secondary | ICD-10-CM | POA: Insufficient documentation

## 2024-03-19 ENCOUNTER — Ambulatory Visit: Payer: Self-pay | Admitting: Nurse Practitioner

## 2024-04-01 ENCOUNTER — Other Ambulatory Visit: Payer: Self-pay

## 2024-04-01 DIAGNOSIS — R931 Abnormal findings on diagnostic imaging of heart and coronary circulation: Secondary | ICD-10-CM

## 2024-04-03 ENCOUNTER — Encounter: Payer: Self-pay | Admitting: Nurse Practitioner

## 2024-04-06 ENCOUNTER — Other Ambulatory Visit: Payer: Self-pay | Admitting: Nurse Practitioner

## 2024-04-06 DIAGNOSIS — E7800 Pure hypercholesterolemia, unspecified: Secondary | ICD-10-CM

## 2024-04-06 DIAGNOSIS — Z79899 Other long term (current) drug therapy: Secondary | ICD-10-CM

## 2024-04-06 DIAGNOSIS — R931 Abnormal findings on diagnostic imaging of heart and coronary circulation: Secondary | ICD-10-CM | POA: Insufficient documentation

## 2024-04-06 MED ORDER — ROSUVASTATIN CALCIUM 10 MG PO TABS: 10.0000 mg | ORAL_TABLET | Freq: Every day | ORAL | 0 refills | Status: DC

## 2024-04-13 ENCOUNTER — Other Ambulatory Visit: Payer: Self-pay | Admitting: Nurse Practitioner

## 2024-04-27 ENCOUNTER — Ambulatory Visit: Payer: Self-pay | Admitting: Family Medicine

## 2024-04-27 ENCOUNTER — Ambulatory Visit: Admitting: Family Medicine

## 2024-04-27 ENCOUNTER — Ambulatory Visit: Payer: Self-pay

## 2024-04-27 ENCOUNTER — Encounter: Payer: Self-pay | Admitting: Family Medicine

## 2024-04-27 VITALS — BP 112/62 | HR 111 | Temp 98.3°F | Ht 67.0 in | Wt 214.4 lb

## 2024-04-27 DIAGNOSIS — R109 Unspecified abdominal pain: Secondary | ICD-10-CM

## 2024-04-27 DIAGNOSIS — K5730 Diverticulosis of large intestine without perforation or abscess without bleeding: Secondary | ICD-10-CM

## 2024-04-27 DIAGNOSIS — K582 Mixed irritable bowel syndrome: Secondary | ICD-10-CM | POA: Diagnosis not present

## 2024-04-27 LAB — COMPREHENSIVE METABOLIC PANEL WITH GFR
AG Ratio: 1.8 (calc) (ref 1.0–2.5)
ALT: 40 U/L — ABNORMAL HIGH (ref 6–29)
AST: 56 U/L — ABNORMAL HIGH (ref 10–35)
Albumin: 4.6 g/dL (ref 3.6–5.1)
Alkaline phosphatase (APISO): 100 U/L (ref 37–153)
BUN: 11 mg/dL (ref 7–25)
CO2: 28 mmol/L (ref 20–32)
Calcium: 9.8 mg/dL (ref 8.6–10.4)
Chloride: 104 mmol/L (ref 98–110)
Creat: 0.84 mg/dL (ref 0.50–1.03)
Globulin: 2.6 g/dL (ref 1.9–3.7)
Glucose, Bld: 132 mg/dL — ABNORMAL HIGH (ref 65–99)
Potassium: 3.6 mmol/L (ref 3.5–5.3)
Sodium: 141 mmol/L (ref 135–146)
Total Bilirubin: 1.1 mg/dL (ref 0.2–1.2)
Total Protein: 7.2 g/dL (ref 6.1–8.1)
eGFR: 84 mL/min/1.73m2 (ref >=60)

## 2024-04-27 LAB — CBC WITH DIFFERENTIAL/PLATELET
Absolute Lymphocytes: 1889 {cells}/uL (ref 850–3900)
Absolute Monocytes: 1207 {cells}/uL — ABNORMAL HIGH (ref 200–950)
Basophils Absolute: 57 {cells}/uL (ref 0–200)
Basophils Relative: 0.4 %
Eosinophils Absolute: 156 {cells}/uL (ref 15–500)
Eosinophils Relative: 1.1 %
HCT: 41.5 % (ref 35.0–45.0)
Hemoglobin: 14.1 g/dL (ref 11.7–15.5)
MCH: 31.5 pg (ref 27.0–33.0)
MCHC: 34 g/dL (ref 32.0–36.0)
MCV: 92.8 fL (ref 80.0–100.0)
MPV: 12.6 fL — ABNORMAL HIGH (ref 7.5–12.5)
Monocytes Relative: 8.5 %
Neutro Abs: 10891 {cells}/uL — ABNORMAL HIGH (ref 1500–7800)
Neutrophils Relative %: 76.7 %
Platelets: 164 Thousand/uL (ref 140–400)
RBC: 4.47 Million/uL (ref 3.80–5.10)
RDW: 12.3 % (ref 11.0–15.0)
Total Lymphocyte: 13.3 %
WBC: 14.2 Thousand/uL — ABNORMAL HIGH (ref 3.8–10.8)

## 2024-04-27 LAB — URINALYSIS, ROUTINE W REFLEX MICROSCOPIC
Bacteria, UA: NONE SEEN /HPF
Bilirubin Urine: NEGATIVE
Glucose, UA: NEGATIVE
Hyaline Cast: NONE SEEN /LPF
Leukocytes,Ua: NEGATIVE
Nitrite: NEGATIVE
Protein, ur: NEGATIVE
Specific Gravity, Urine: 1.02 (ref 1.001–1.035)
WBC, UA: NONE SEEN /HPF (ref 0–5)
pH: 5.5 (ref 5.0–8.0)

## 2024-04-27 LAB — MICROSCOPIC MESSAGE

## 2024-04-27 MED ORDER — ONDANSETRON HCL 8 MG PO TABS: 8.0000 mg | ORAL_TABLET | Freq: Three times a day (TID) | ORAL | 0 refills | Status: DC | PRN

## 2024-04-27 MED ORDER — METRONIDAZOLE 500 MG PO TABS: 500.0000 mg | ORAL_TABLET | Freq: Three times a day (TID) | ORAL | 0 refills | Status: AC

## 2024-04-27 MED ORDER — CIPROFLOXACIN HCL 500 MG PO TABS: 500.0000 mg | ORAL_TABLET | Freq: Two times a day (BID) | ORAL | 0 refills | Status: AC

## 2024-04-27 MED ORDER — FLUCONAZOLE 150 MG PO TABS: 150.0000 mg | ORAL_TABLET | Freq: Every day | ORAL | 1 refills | Status: DC

## 2024-04-27 NOTE — Telephone Encounter (Signed)
 FYI Only or Action Required?: FYI only for provider.  Patient was last seen in primary care on 02/12/2024 by Mauro Elveria BROCKS, NP.  Called Nurse Triage reporting Abdominal Cramping.  Symptoms began yesterday.  Interventions attempted: Rest, hydration, or home remedies.  Symptoms are: unchanged.  Triage Disposition: See HCP Within 4 Hours (Or PCP Triage)scheduled with another office due to availability and patient request.   Patient/caregiver understands and will follow disposition?: Yes  Copied from CRM #[id number]. Topic: Clinical - Red Word Triage >> Apr 27, 2024 12:27 PM Donee H wrote: Red Word that prompted transfer to Nurse Triage: Patient stating experiencing pain across abdomen. She states bad cramping for 2 days now. Also stated bowel movement has not be frequent as they should. She doesn't know if this is coming from the medicine she has been taking rosuvastatin  (CRESTOR ) 10 MG tablet Reason for Disposition  [1] MILD-MODERATE pain AND [2] constant AND [3] present > 2 hours  Answer Assessment - Initial Assessment Questions 1. LOCATION: Where does it hurt?      Upper abdominal area 2. RADIATION: Does the pain shoot anywhere else? (e.g., chest, back)     Lower back 3. ONSET: When did the pain begin? (e.g., minutes, hours or days ago)      yesterday 4. SUDDEN: Gradual or sudden onset?     gradual 5. PATTERN Does the pain come and go, or is it constant?     Constant 6. SEVERITY: How bad is the pain?  (e.g., Scale 1-10; mild, moderate, or severe)     5 out of 10 7. RECURRENT SYMPTOM: Have you ever had this type of stomach pain before? If Yes, ask: When was the last time? and What happened that time?      Yes but not as intense 8. AGGRAVATING FACTORS: Does anything seem to cause this pain? (e.g., foods, stress, alcohol)     no 9. CARDIAC SYMPTOMS: Do you have any of the following symptoms: chest pain, difficulty breathing, sweating, nausea?     no 10.  OTHER SYMPTOMS: Do you have any other symptoms? (e.g., back pain, diarrhea, fever, urination pain, vomiting)         Patient requesting to be seen today-no availability today in the office. Discussed with patient options for same day in the area vs being seen tomorrow in PCP office by another provider. Patient agreed to be seen today at another office for evaluation. Scheduled for acute appointment at 2:40 PM today. Address for office given to patient.  Protocols used: Abdominal Pain - Upper-A-AH

## 2024-04-27 NOTE — Progress Notes (Addendum)
 zofra  Patient Office Visit  Assessment & Plan:  Abdominal pain, unspecified abdominal location -     CBC with Differential/Platelet -     Urinalysis, Routine w reflex microscopic -     CT ABDOMEN PELVIS W CONTRAST; Future -     Ciprofloxacin  HCl; Take 1 tablet (500 mg total) by mouth 2 (two) times daily for 10 days.  Dispense: 20 tablet; Refill: 0 -     metroNIDAZOLE ; Take 1 tablet (500 mg total) by mouth 3 (three) times daily for 10 days.  Dispense: 30 tablet; Refill: 0 -     Fluconazole ; Take 1 tablet (150 mg total) by mouth daily.  Dispense: 2 tablet; Refill: 1 -     Comprehensive metabolic panel with GFR -     Ondansetron  HCl; Take 1 tablet (8 mg total) by mouth every 8 (eight) hours as needed for nausea or vomiting.  Dispense: 30 tablet; Refill: 0  Diverticulosis of colon  Irritable bowel syndrome with both constipation and diarrhea   Assessment and Plan    Abdominal pain in patient with history of diverticulitis and mixed irritable bowel syndrome Recurrent abdominal pain with history of diverticulitis and mixed IBS. Pain diffuse, cramping, distension sensation. Differential includes diverticulitis, constipation, or other pathology. Current episode atypical, not localized to LLQ. Discussed CT scan versus empirical antibiotics. - Order CT scan of abdomen at [facility] (hopefully today) - was ordered stat. - Prescribe Cipro  500 mg BID for 10 days. - Prescribe Flagyl  500 mg TID for 10 days. - Prescribe Diflucan  for potential yeast infection. - Order CBC for WBC count. - Order kidney function tests. - Send prescriptions to [facility] on [address] - Advise monitoring symptoms and seek care if worsens ie go to ED          No follow-ups on file.   Subjective:    Patient ID: Brandy Hensley, female    DOB: [birth year]  Age: 53 y.o. MRN: [id number]  Chief Complaint  Patient presents with   Abdominal Pain    Hx of diverticulitis. Pt c/o of pain in the hypogastric/umbilical area.      Abdominal Pain   Discussed the use of AI scribe software for clinical note transcription with the patient, who gave verbal consent to proceed.  History of Present Illness        Brandy Hensley is a 53 year old female with previous history of diverticulitis who presents with lower abdominal pain and cramping.  She has been experiencing constant abdominal pain and cramping since yesterday morning, primarily located in the lower abdomen. 5/5 pain level sitting but goes to 10/10 with movement. She is having some nausea but no vomiting, fever, or chills are present. She feels somewhat distended and experiences pain when her abdomen is jarred during walking. Patient still has her appendix and uterus, no history of ovarian cysts. Had cholecystectomy 2014.   She has a history of diverticulitis, with the first episode occurring in 2005, which required hospitalization. Flare-ups are typically managed with Cipro  and Flagyl , which have been effective in the past. Her last episode was several months ago, confirmed by a CT scan in November 2024 showing diverticulitis without abscess. She visited urgent care in April for abdominal pain but was not treated there; her primary care provider later prescribed Cipro  for five days, which resolved her symptoms.  She has variable bowel habits, alternating between diarrhea and constipation, and notes that her bowel movements have become less frequent since starting a  statin. Her last bowel movement was yesterday morning, which provided some relief, and she describes it as light in color and frayed, without blood.  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 She is able to eat without issues, having consumed two boiled eggs and a half ham and cheese sandwich today. She reports that her heart rate occasionally increases, which she attributes to anxiety. She has a known allergy to amoxicillin , which  causes diarrhea. Last colon cancer screening 2023 with cologuard Physical Exam CARDIOVASCULAR: Tachycardia. ABDOMEN: Tender on palpation. Results RADIOLOGY Abdominal CT: Diverticulitis, no abscess (06/2023) Assessment & Plan Abdominal pain in patient with history of diverticulitis and mixed irritable bowel syndrome Recurrent abdominal pain with history of diverticulitis and mixed IBS. Pain diffuse, cramping, distension sensation. Differential includes diverticulitis, constipation, or other pathology. Current episode atypical, not localized to LLQ. Discussed CT scan versus empirical antibiotics. - Order CT scan of abdomen at [facility] (hopefully today) - was ordered stat. - Prescribe Cipro  500 mg BID for 10 days. - Prescribe Flagyl  500 mg TID for 10 days. - Prescribe Diflucan  for potential yeast infection. - Order CBC for WBC count. - Order kidney function tests. - Send prescriptions to [facility] on [address] - Advise monitoring symptoms and seek care if worsens ie go to ED    The 10-year ASCVD risk score (Arnett DK, et al., 2019) is: 3.7%  Past Medical History:  Diagnosis Date   COPD (chronic obstructive pulmonary disease) (HCC)    Diverticulitis    GERD (gastroesophageal reflux disease)    PONV (postoperative nausea and vomiting)    Past Surgical History:  Procedure Laterality Date   CHOLECYSTECTOMY  09/10/2012   Procedure: LAPAROSCOPIC CHOLECYSTECTOMY;  Surgeon: Oneil DELENA Budge, MD;  Location: [facility];  Service: General;  Laterality: N/A;   COLONOSCOPY  2005-2006   Dr.Rehman @[facility]    TUBAL LIGATION     Social History   Tobacco Use   Smoking status: Every Day    Current packs/day: 0.50    Average packs/day: 0.5 packs/day for 31.0 years (15.5 ttl pk-yrs)    Types: Cigarettes   Smokeless tobacco: Never  Vaping Use   Vaping status: Never Used  Substance Use Topics   Alcohol use: No    Alcohol/week: 0.0 standard drinks of alcohol   Drug use: No   Family History  Problem  Relation Age of Onset   Hypertension Mother    Osteoporosis Mother    COPD Mother        smoked   Heart attack Maternal Grandmother    Cancer Maternal Grandmother 75       pancreatic cancer   Heart disease Maternal Grandmother 39       MI/CABG   Emphysema Father        smoked   Lung cancer Father    Allergies  Allergen Reactions   Codeine Nausea And Vomiting    Skin on fire, Hot Flashes    Flovent  Diskus [Fluticasone  Furoate] Other (See Comments)    Irritation ofthroat   Augmentin  [Amoxicillin -Pot Clavulanate] Diarrhea   Contrave  [Naltrexone -Bupropion  Hcl Er] Palpitations   Qsymia  [Phentermine -Topiramate  Er] Other (See Comments)    Tingling and muscle tightness    Review of Systems  Gastrointestinal:  Positive for abdominal pain.      Objective:    BP 112/62   Pulse (!) 111   Temp 98.3 F (36.8 C)   Ht 5'  7 (1.702 m)   Wt 214 lb 6 oz (97.2 kg)   LMP 11/01/2018 (Within Weeks) Comment: perimenopausal  SpO2 99%   BMI 33.58 kg/m  BP Readings from Last 3 Encounters:  04/27/24 112/62  02/12/24 120/82  12/30/23 110/64   Wt Readings from Last 3 Encounters:  04/27/24 214 lb 6 oz (97.2 kg)  02/12/24 216 lb (98 kg)  12/30/23 214 lb (97.1 kg)    Physical Exam Vitals and nursing note reviewed.  Constitutional:      Appearance: Normal appearance.  HENT:     Head: Normocephalic.     Right Ear: Tympanic membrane, ear canal and external ear normal.     Left Ear: Tympanic membrane, ear canal and external ear normal.  Eyes:     Extraocular Movements: Extraocular movements intact.     Conjunctiva/sclera: Conjunctivae normal.     Pupils: Pupils are equal, round, and reactive to light.  Cardiovascular:     Rate and Rhythm: Regular rhythm. Tachycardia present.     Heart sounds: Normal heart sounds.  Pulmonary:     Effort: Pulmonary effort is normal.     Breath sounds: Normal breath sounds.  Abdominal:     General: Bowel sounds are normal.     Palpations: There is  no mass.     Tenderness: There is abdominal tenderness in the right lower quadrant, epigastric area and left lower quadrant. There is guarding. There is no right CVA tenderness or left CVA tenderness.     Comments: Patient has severe pain below left rib cage anteriorly with palpation (pain in 10/10)  Musculoskeletal:     Right lower leg: No edema.     Left lower leg: No edema.  Neurological:     General: No focal deficit present.     Mental Status: She is alert and oriented to person, place, and time.  Psychiatric:        Mood and Affect: Mood normal.        Behavior: Behavior normal.      No results found for any visits on 04/27/24.

## 2024-04-28 ENCOUNTER — Ambulatory Visit (HOSPITAL_COMMUNITY)
Admission: RE | Admit: 2024-04-28 | Discharge: 2024-04-28 | Disposition: A | Discharge status: Home / Self Care | Source: Ambulatory Visit | Attending: Family Medicine | Admitting: Family Medicine

## 2024-04-28 ENCOUNTER — Ambulatory Visit: Admitting: Family Medicine

## 2024-04-28 DIAGNOSIS — R109 Unspecified abdominal pain: Secondary | ICD-10-CM | POA: Diagnosis present

## 2024-04-28 MED ORDER — IOHEXOL 9 MG/ML PO SOLN
500.0000 mL | ORAL | Status: AC
  Administered 2024-04-28: 500 mL via ORAL

## 2024-04-28 MED ORDER — IOHEXOL 300 MG/ML  SOLN
100.0000 mL | Freq: Once | INTRAMUSCULAR | Status: AC | PRN
  Administered 2024-04-28: 100 mL via INTRAVENOUS

## 2024-06-08 ENCOUNTER — Encounter (INDEPENDENT_AMBULATORY_CARE_PROVIDER_SITE_OTHER): Payer: Self-pay | Admitting: Gastroenterology

## 2024-06-09 LAB — HEPATIC FUNCTION PANEL
ALT: 26 IU/L (ref 0–32)
AST: 30 IU/L (ref 0–40)
Albumin: 4.4 g/dL (ref 3.8–4.9)
Alkaline Phosphatase: 118 IU/L (ref 49–135)
Bilirubin Total: 0.9 mg/dL (ref 0.0–1.2)
Bilirubin, Direct: 0.29 mg/dL (ref 0.00–0.40)
Total Protein: 6.7 g/dL (ref 6.0–8.5)

## 2024-06-09 LAB — LIPID PANEL
Chol/HDL Ratio: 3.1 ratio (ref 0.0–4.4)
Cholesterol, Total: 151 mg/dL (ref 100–199)
HDL: 49 mg/dL (ref >39)
LDL Chol Calc (NIH): 83 mg/dL (ref 0–99)
Triglycerides: 101 mg/dL (ref 0–149)
VLDL Cholesterol Cal: 19 mg/dL (ref 5–40)

## 2024-06-10 ENCOUNTER — Ambulatory Visit: Payer: Self-pay | Admitting: Nurse Practitioner

## 2024-07-08 ENCOUNTER — Other Ambulatory Visit: Payer: Self-pay | Admitting: Nurse Practitioner

## 2024-07-26 ENCOUNTER — Ambulatory Visit: Payer: Self-pay | Admitting: Family Medicine

## 2024-07-26 ENCOUNTER — Encounter: Payer: Self-pay | Admitting: Family Medicine

## 2024-07-26 VITALS — BP 103/75 | HR 99 | Temp 97.9°F | Ht 67.0 in | Wt 213.0 lb

## 2024-07-26 DIAGNOSIS — J441 Chronic obstructive pulmonary disease with (acute) exacerbation: Secondary | ICD-10-CM | POA: Diagnosis not present

## 2024-07-26 MED ORDER — DOXYCYCLINE HYCLATE 100 MG PO CAPS: 100.0000 mg | ORAL_CAPSULE | Freq: Two times a day (BID) | ORAL | 0 refills | Status: DC

## 2024-07-26 MED ORDER — PREDNISONE 50 MG PO TABS: 50.0000 mg | ORAL_TABLET | Freq: Every day | ORAL | 0 refills | Status: AC

## 2024-07-26 NOTE — Assessment & Plan Note (Signed)
 Treating with prednisone  and doxycycline .

## 2024-07-26 NOTE — Progress Notes (Signed)
 Subjective:  Patient ID: Brandy Hensley, female    DOB: 1971/06/23  Age: 53 y.o. MRN: [id number]  CC:   Chief Complaint  Patient presents with   URI    Congestion, dull headache, chest feels tight, SOB     HPI:  53 year old female with underlying COPD presents with respiratory symptoms.  Patient states that she has had sinus congestion for the past several days.  On "Sunday she had a severe headache on the top of her head after she went to pick up a package.  This is since improved but still has a dull ache.  Having significant congestion.  Also having cough and shortness of breath.  No documented fever.  No relieving factors.  Patient Active Problem List   Diagnosis Date Noted   COPD exacerbation (HCC) 07/26/2024   Abnormal screening cardiac CT 04/06/2024   Vasomotor rhinitis 02/12/2024   Tobacco use 02/04/2023   Cervical radiculopathy 02/13/2022   DOE (dyspnea on exertion) 12/22/2018   COPD GOLD II (barely)  but MZ/ smoker 12/22/2018   Irritable bowel syndrome with diarrhea 01/15/2017   Hyperlipidemia 07/05/2015   GERD (gastroesophageal reflux disease) 10/31/2013    Social Hx   Social History   Socioeconomic History   Marital status: Divorced    Spouse name: Not on file   Number of children: Not on file   Years of education: Not on file   Highest education level: Not on file  Occupational History   Not on file  Tobacco Use   Smoking status: Every Day    Current packs/day: 0.50    Average packs/day: 0.5 packs/day for 31.0 years (15.5 ttl pk-yrs)    Types: Cigarettes   Smokeless tobacco: Never  Vaping Use   Vaping status: Never Used  Substance and Sexual Activity   Alcohol use: No    Alcohol/week: 0.0 standard drinks of alcohol   Drug use: No   Sexual activity: Yes    Birth control/protection: Surgical  Other Topics Concern   Not on file  Social History Narrative   Not on file   Social Drivers of Health   Financial Resource Strain: Not on file  Food  Insecurity: Not on file  Transportation Needs: Not on file  Physical Activity: Not on file  Stress: Not on file  Social Connections: Not on file    Review of Systems Per HPI  Objective:  BP 103/75   Pulse 99   Temp 97.9 F (36.6 C)   Ht 5' 7 (1.702 m)   Wt 213 lb (96.6 kg)   LMP 11/01/2018 (Within Weeks) Comment: perimenopausal  SpO2 98%   BMI 33.36 kg/m      12" /09/2023    8:28 AM 04/27/2024    2:44 PM 02/12/2024    1:07 PM  BP/Weight  Systolic BP 103 112 120  Diastolic BP 75 62 82  Wt. (Lbs) 213 214.38 216  BMI 33.36 kg/m2 33.58 kg/m2 33.83 kg/m2    Physical Exam Vitals and nursing note reviewed.  Constitutional:      General: She is not in acute distress. HENT:     Head: Normocephalic and atraumatic.     Right Ear: Tympanic membrane normal.     Left Ear: Tympanic membrane normal.     Mouth/Throat:     Pharynx: Oropharynx is clear.  Cardiovascular:     Rate and Rhythm: Normal rate and regular rhythm.  Pulmonary:     Effort: Pulmonary effort is normal.  Breath sounds: Normal breath sounds. No wheezing, rhonchi or rales.  Neurological:     Mental Status: She is alert.     Lab Results  Component Value Date   WBC 14.2 (H) 04/27/2024   HGB 14.1 04/27/2024   HCT 41.5 04/27/2024   PLT 164 04/27/2024   GLUCOSE 132 (H) 04/27/2024   CHOL 151 06/08/2024   TRIG 101 06/08/2024   HDL 49 06/08/2024   LDLCALC 83 06/08/2024   ALT 26 06/08/2024   AST 30 06/08/2024   NA 141 04/27/2024   K 3.6 04/27/2024   CL 104 04/27/2024   CREATININE 0.84 04/27/2024   BUN 11 04/27/2024   CO2 28 04/27/2024   TSH 1.050 01/29/2023     Assessment & Plan:  COPD exacerbation (HCC) Assessment & Plan: Treating with prednisone  and doxycycline .  Orders: -     predniSONE ; Take 1 tablet (50 mg total) by mouth daily for 5 days.  Dispense: 5 tablet; Refill: 0 -     Doxycycline  Hyclate; Take 1 capsule (100 mg total) by mouth 2 (two) times daily. Take with food.  Dispense: 14  capsule; Refill: 0    Follow-up:  Return if symptoms worsen or fail to improve.  Jacqulyn Ahle DO [facility]

## 2024-07-26 NOTE — Patient Instructions (Signed)
 Rest, fluids.  Medications as directed.  If you fail to improve or worsen, please let us  know.

## 2024-07-28 ENCOUNTER — Ambulatory Visit: Admitting: Cardiology

## 2024-07-28 NOTE — Progress Notes (Deleted)
 Clinical Summary Brandy Hensley is a 53 y.o.female   Past Medical History:  Diagnosis Date   COPD (chronic obstructive pulmonary disease) (HCC)    Diverticulitis    GERD (gastroesophageal reflux disease)    PONV (postoperative nausea and vomiting)      Allergies  Allergen Reactions   Codeine Nausea And Vomiting    Skin on fire, Hot Flashes    Flovent  Diskus [Fluticasone  Furoate] Other (See Comments)    Irritation ofthroat   Augmentin  [Amoxicillin -Pot Clavulanate] Diarrhea   Contrave  [Naltrexone -Bupropion  Hcl Er] Palpitations   Qsymia  [Phentermine -Topiramate  Er] Other (See Comments)    Tingling and muscle tightness     Current Outpatient Medications  Medication Sig Dispense Refill   acetaminophen  (TYLENOL ) 325 MG tablet Take 650 mg by mouth every 6 (six) hours as needed.     albuterol  (PROVENTIL ) (2.5 MG/3ML) 0.083% nebulizer solution Take 3 mLs (2.5 mg total) by nebulization every 6 (six) hours as needed for wheezing or shortness of breath. 75 mL 12   albuterol  (VENTOLIN  HFA) 108 (90 Base) MCG/ACT inhaler Inhale 2 puffs into the lungs every 4 (four) hours as needed. 18 g 0   cyclobenzaprine  (FLEXERIL ) 10 MG tablet Take 1 tablet (10 mg total) by mouth 3 (three) times daily as needed for muscle spasms. 30 tablet 1   doxycycline  (VIBRAMYCIN ) 100 MG capsule Take 1 capsule (100 mg total) by mouth 2 (two) times daily. Take with food. 14 capsule 0   Famotidine  (PEPCID  PO) Take 10 mg by mouth daily as needed (Acid after eating dinner).     fluconazole  (DIFLUCAN ) 150 MG tablet Take 1 tablet (150 mg total) by mouth daily. 2 tablet 1   ibuprofen (ADVIL) 200 MG tablet Take 200 mg by mouth every 6 (six) hours as needed.     ketoconazole  (NIZORAL ) 2 % cream Apply 1 Application topically 2 (two) times daily. Prn rash 30 g 0   meclizine  (ANTIVERT ) 25 MG tablet Take 1 tablet (25 mg total) by mouth 3 (three) times daily as needed for dizziness. 30 tablet 0   ondansetron  (ZOFRAN ) 8 MG tablet  Take 1 tablet (8 mg total) by mouth every 8 (eight) hours as needed for nausea or vomiting. 30 tablet 0   predniSONE  (DELTASONE ) 50 MG tablet Take 1 tablet (50 mg total) by mouth daily for 5 days. 5 tablet 0   rosuvastatin  (CRESTOR ) 10 MG tablet Take 1 tablet (10 mg total) by mouth daily. For cholesterol 90 tablet 0   No current facility-administered medications for this visit.     Past Surgical History:  Procedure Laterality Date   CHOLECYSTECTOMY  09/10/2012   Procedure: LAPAROSCOPIC CHOLECYSTECTOMY;  Surgeon: Oneil DELENA Budge, MD;  Location: [facility];  Service: General;  Laterality: N/A;   COLONOSCOPY  2005-2006   Dr.Rehman @[facility]    TUBAL LIGATION       Allergies  Allergen Reactions   Codeine Nausea And Vomiting    Skin on fire, Hot Flashes    Flovent  Diskus [Fluticasone  Furoate] Other (See Comments)    Irritation ofthroat   Augmentin  [Amoxicillin -Pot Clavulanate] Diarrhea   Contrave  [Naltrexone -Bupropion  Hcl Er] Palpitations   Qsymia  [Phentermine -Topiramate  Er] Other (See Comments)    Tingling and muscle tightness      Family History  Problem Relation Age of Onset   Hypertension Mother    Osteoporosis Mother    COPD Mother        smoked   Heart attack Maternal Grandmother  Cancer Maternal Grandmother 63       pancreatic cancer   Heart disease Maternal Grandmother 22       MI/CABG   Emphysema Father        smoked   Lung cancer Father      Social History Brandy Hensley reports that she has been smoking cigarettes. She has a 15.5 pack-year smoking history. She has never used smokeless tobacco. Brandy Hensley reports no history of alcohol use.   Review of Systems CONSTITUTIONAL: No weight loss, fever, chills, weakness or fatigue.  HEENT: Eyes: No visual loss, blurred vision, double vision or yellow sclerae.No hearing loss, sneezing, congestion, runny nose or sore throat.  SKIN: No rash or itching.  CARDIOVASCULAR:  RESPIRATORY: No shortness of breath, cough or sputum.   GASTROINTESTINAL: No anorexia, nausea, vomiting or diarrhea. No abdominal pain or blood.  GENITOURINARY: No burning on urination, no polyuria NEUROLOGICAL: No headache, dizziness, syncope, paralysis, ataxia, numbness or tingling in the extremities. No change in bowel or bladder control.  MUSCULOSKELETAL: No muscle, back pain, joint pain or stiffness.  LYMPHATICS: No enlarged nodes. No history of splenectomy.  PSYCHIATRIC: No history of depression or anxiety.  ENDOCRINOLOGIC: No reports of sweating, cold or heat intolerance. No polyuria or polydipsia.  Brandy Hensley   Physical Examination There were no vitals filed for this visit. There were no vitals filed for this visit.  Gen: resting comfortably, no acute distress HEENT: no scleral icterus, pupils equal round and reactive, no palptable cervical adenopathy,  CV Resp: Clear to auscultation bilaterally GI: abdomen is soft, non-tender, non-distended, normal bowel sounds, no hepatosplenomegaly MSK: extremities are warm, no edema.  Skin: warm, no rash Neuro:  no focal deficits Psych: appropriate affect   Diagnostic Studies     Assessment and Plan        Dorn PHEBE Ross, M.D., F.A.C.C.

## 2024-08-02 ENCOUNTER — Ambulatory Visit: Attending: Cardiology | Admitting: Cardiology

## 2024-08-02 VITALS — BP 110/64 | HR 68 | Ht 67.0 in | Wt 215.6 lb

## 2024-08-02 DIAGNOSIS — Z720 Tobacco use: Secondary | ICD-10-CM

## 2024-08-02 DIAGNOSIS — E782 Mixed hyperlipidemia: Secondary | ICD-10-CM

## 2024-08-02 DIAGNOSIS — R931 Abnormal findings on diagnostic imaging of heart and coronary circulation: Secondary | ICD-10-CM

## 2024-08-02 DIAGNOSIS — Z136 Encounter for screening for cardiovascular disorders: Secondary | ICD-10-CM

## 2024-08-02 NOTE — Progress Notes (Signed)
 Cardiology Office Note  Date: 08/02/2024   ID: Brandy Hensley, DOB [birth year], MRN [id number]  PCP: Cook, Jayce G, DO  Chief Complaint:  Chief Complaint  Patient presents with   Elevated coronary calcium  score   History of Present Illness: Brandy Hensley is a 53 y.o. female referred for cardiology consultation by Ms. Hoskins NP given abnormal coronary calcium  score.  Patient had a routine physical back during the summer at which point her LDL was elevated at 145.  She has a prior history of statin intolerance (Lipitor 10 mg daily with myalgias) and LDL as high as 170 approximately 3 years ago.  Coronary calcium  score was obtained and found to be elevated at 52.8 in July.  She was started on Crestor  10 mg daily which she is currently tolerating and her LDL has come down to 83 so far.  She does not report any exertional chest pain or unusual shortness of breath with activity.  Not exercising regularly, we did discuss a basic walking plan.  She has a longstanding history of tobacco abuse, we discussed strategies for smoking cessation.  She has not had a major quit attempt previously.  I reviewed her current medications.  She is also taking aspirin 81 mg daily.  I reviewed her ECG today which shows normal sinus rhythm.  Review of Systems: As outlined in the history of present illness.  Recent URI.  Otherwise negative.  Past Medical History: Past Medical History:  Diagnosis Date   COPD (chronic obstructive pulmonary disease) (HCC)    Diverticulitis    GERD (gastroesophageal reflux disease)    PONV (postoperative nausea and vomiting)    Past Surgical History: Past Surgical History:  Procedure Laterality Date   CHOLECYSTECTOMY  09/10/2012   Procedure: LAPAROSCOPIC CHOLECYSTECTOMY;  Surgeon: Oneil DELENA Budge, MD;  Location: [facility];  Service: General;  Laterality: N/A;   COLONOSCOPY  2005-2006   Dr.Rehman @[facility]    TUBAL LIGATION     Family History: Family History  Problem Relation Age of  Onset   Hypertension Mother    Osteoporosis Mother    COPD Mother        smoked   Heart attack Maternal Grandmother    Cancer Maternal Grandmother 37       pancreatic cancer   Heart disease Maternal Grandmother 45       MI/CABG   Emphysema Father        smoked   Lung cancer Father    Social History:  Social History   Tobacco Use   Smoking status: Every Day    Current packs/day: 0.50    Average packs/day: 0.5 packs/day for 31.0 years (15.5 ttl pk-yrs)    Types: Cigarettes   Smokeless tobacco: Never  Substance Use Topics   Alcohol use: No    Alcohol/week: 0.0 standard drinks of alcohol   Medications: Current Outpatient Medications on File Prior to Visit  Medication Sig Dispense Refill   acetaminophen  (TYLENOL ) 325 MG tablet Take 650 mg by mouth every 6 (six) hours as needed.     albuterol  (PROVENTIL ) (2.5 MG/3ML) 0.083% nebulizer solution Take 3 mLs (2.5 mg total) by nebulization every 6 (six) hours as needed for wheezing or shortness of breath. 75 mL 12   albuterol  (VENTOLIN  HFA) 108 (90 Base) MCG/ACT inhaler Inhale 2 puffs into the lungs every 4 (four) hours as needed. 18 g 0   aspirin EC 81 MG tablet Take 81 mg by mouth daily. Swallow whole.  cyclobenzaprine  (FLEXERIL ) 10 MG tablet Take 1 tablet (10 mg total) by mouth 3 (three) times daily as needed for muscle spasms. 30 tablet 1   doxycycline  (VIBRAMYCIN ) 100 MG capsule Take 1 capsule (100 mg total) by mouth 2 (two) times daily. Take with food. 14 capsule 0   Famotidine  (PEPCID  PO) Take 10 mg by mouth daily as needed (Acid after eating dinner).     fluconazole  (DIFLUCAN ) 150 MG tablet Take 1 tablet (150 mg total) by mouth daily. 2 tablet 1   ibuprofen (ADVIL) 200 MG tablet Take 200 mg by mouth every 6 (six) hours as needed.     ketoconazole  (NIZORAL ) 2 % cream Apply 1 Application topically 2 (two) times daily. Prn rash 30 g 0   meclizine  (ANTIVERT ) 25 MG tablet Take 1 tablet (25 mg total) by mouth 3 (three) times daily as  needed for dizziness. 30 tablet 0   rosuvastatin  (CRESTOR ) 10 MG tablet Take 1 tablet (10 mg total) by mouth daily. For cholesterol 90 tablet 0   No current facility-administered medications on file prior to visit.   Allergies: Allergies  Allergen Reactions   Codeine Nausea And Vomiting    Skin on fire, Hot Flashes    Flovent  Diskus [Fluticasone  Furoate] Other (See Comments)    Irritation ofthroat   Augmentin  [Amoxicillin -Pot Clavulanate] Diarrhea   Contrave  [Naltrexone -Bupropion  Hcl Er] Palpitations   Qsymia  [Phentermine -Topiramate  Er] Other (See Comments)    Tingling and muscle tightness   Physical Exam: VS:  BP 110/64 (BP Location: Left Arm, Patient Position: Sitting, Cuff Size: Large)   Pulse 68   Ht 5' 7 (1.702 m)   Wt 215 lb 9.6 oz (97.8 kg)   LMP 11/01/2018 (Within Weeks) Comment: perimenopausal  SpO2 98%   BMI 33.77 kg/m , BMI Body mass index is 33.77 kg/m.  Wt Readings from Last 3 Encounters:  08/02/24 215 lb 9.6 oz (97.8 kg)  07/26/24 213 lb (96.6 kg)  04/27/24 214 lb 6 oz (97.2 kg)    General: Patient appears comfortable at rest. HEENT: Conjunctiva and lids normal. Neck: Supple, no elevated JVP or carotid bruits. Lungs: Clear to auscultation, nonlabored breathing at rest. Cardiac: Regular rate and rhythm, no S3 or significant systolic murmur, no pericardial rub. Extremities: No pitting edema, distal pulses 2+. Skin: Warm and dry. Musculoskeletal: No kyphosis. Neuropsychiatric: Alert and oriented x3, affect grossly appropriate.  ECG:  An ECG dated 05/31/2021 was personally reviewed today and demonstrated:  Sinus rhythm with PVC.  Labwork: 04/27/2024: BUN 11; Creat 0.84; Hemoglobin 14.1; Platelets 164; Potassium 3.6; Sodium 141 06/08/2024: ALT 26; AST 30     Component Value Date/Time   CHOL 151 06/08/2024 0832   TRIG 101 06/08/2024 0832   HDL 49 06/08/2024 0832   CHOLHDL 3.1 06/08/2024 0832   CHOLHDL 4.2 10/17/2013 0750   VLDL 15 10/17/2013 0750    LDLCALC 83 06/08/2024 0832   Other Studies Reviewed Today:  Coronary calcium  score 03/18/2024: FINDINGS: Non-cardiac: No significant non cardiac findings on limited lung and soft tissue windows. See separate report from [facility].   Ascending Aorta: Normal diameter 2.8 cm   Pericardium: Normal   Coronary arteries: Calcium  noted in LAD and RCA   LM 0   LCX 0   LAD 1.33   RCA 51.5   Total: 52.8   IMPRESSION: Coronary calcium  score of 52.8. This was 95 th percentile for age and sex matched control.  Assessment and Plan:  1.  Elevated coronary calcium  score of  52.8.  Overall low risk in terms of cardiac event rate, but does underscore the importance of lipid management.  She is asymptomatic and her ECG is normal.  2.  Mixed hyperlipidemia.  Most recent LDL 83 and HDL 49 in October on Crestor  10 mg daily.  LDL has come down from 145 on no treatment.  Discussed diet and exercise.  Would recommend recheck FLP in 6 months, ideally would aim for LDL at least under 70.  3.  History of occasional PVCs by prior cardiac monitoring in 2022 (approximately 2% total beats).  Asymptomatic.  4.  Longstanding tobacco abuse.  We did discuss strategies for smoking cessation including pharmacologic therapy.  She states that she has discussed this some with her PCP over time.  Disposition:  Follow up with PCP routinely, cardiology as needed.  Signed, Jayson JUDITHANN Sierras, M.D., F.A.C.C. [facility] at [facility]

## 2024-08-02 NOTE — Patient Instructions (Addendum)

## 2024-08-02 NOTE — Progress Notes (Deleted)
 Cardiology Office Note  Date: 08/02/2024   ID: Brandy Hensley, DOB 19-May-1971, MRN [id number]  PCP: Cook, Jayce G, DO  Chief Complaint:  Chief Complaint  Patient presents with   Elevated coronary calcium  score   History of Present Illness: Brandy Hensley is a 53 y.o. female referred for cardiology consultation by Ms. Hoskins NP for cardiac assessment given elevated coronary calcium  score.  I reviewed available information.  Coronary calcium  score in July of this year was 52.8 (low risk).  Coronary calcium  predominantly noted in the RCA distribution.  Lab work shows mixed hyperlipidemia, LDL as high as 170 three years ago, more recently 145 with decreased to 83 on Crestor  10 mg daily.  Lipitor  I reviewed her ECG today which shows normal sinus rhythm.  Review of Systems: As outlined in the history of present illness.  Past Medical History: Past Medical History:  Diagnosis Date   COPD (chronic obstructive pulmonary disease) (HCC)    Diverticulitis    GERD (gastroesophageal reflux disease)    PONV (postoperative nausea and vomiting)    Past Surgical History: Past Surgical History:  Procedure Laterality Date   CHOLECYSTECTOMY  09/10/2012   Procedure: LAPAROSCOPIC CHOLECYSTECTOMY;  Surgeon: Oneil DELENA Budge, MD;  Location: [facility];  Service: General;  Laterality: N/A;   COLONOSCOPY  2005-2006   Dr.Rehman @[facility]    TUBAL LIGATION     Family History: Family History  Problem Relation Age of Onset   Hypertension Mother    Osteoporosis Mother    COPD Mother        smoked   Heart attack Maternal Grandmother    Cancer Maternal Grandmother 71       pancreatic cancer   Heart disease Maternal Grandmother 5       MI/CABG   Emphysema Father        smoked   Lung cancer Father    Social History:  Social History   Tobacco Use   Smoking status: Every Day    Current packs/day: 0.50    Average packs/day: 0.5 packs/day for 31.0 years (15.5 ttl pk-yrs)    Types: Cigarettes    Smokeless tobacco: Never  Substance Use Topics   Alcohol use: No    Alcohol/week: 0.0 standard drinks of alcohol   Medications: Current Outpatient Medications on File Prior to Visit  Medication Sig Dispense Refill   acetaminophen  (TYLENOL ) 325 MG tablet Take 650 mg by mouth every 6 (six) hours as needed.     albuterol  (PROVENTIL ) (2.5 MG/3ML) 0.083% nebulizer solution Take 3 mLs (2.5 mg total) by nebulization every 6 (six) hours as needed for wheezing or shortness of breath. 75 mL 12   albuterol  (VENTOLIN  HFA) 108 (90 Base) MCG/ACT inhaler Inhale 2 puffs into the lungs every 4 (four) hours as needed. 18 g 0   cyclobenzaprine  (FLEXERIL ) 10 MG tablet Take 1 tablet (10 mg total) by mouth 3 (three) times daily as needed for muscle spasms. 30 tablet 1   doxycycline  (VIBRAMYCIN ) 100 MG capsule Take 1 capsule (100 mg total) by mouth 2 (two) times daily. Take with food. 14 capsule 0   Famotidine  (PEPCID  PO) Take 10 mg by mouth daily as needed (Acid after eating dinner).     fluconazole  (DIFLUCAN ) 150 MG tablet Take 1 tablet (150 mg total) by mouth daily. 2 tablet 1   ibuprofen (ADVIL) 200 MG tablet Take 200 mg by mouth every 6 (six) hours as needed.     ketoconazole  (NIZORAL ) 2 % cream  Apply 1 Application topically 2 (two) times daily. Prn rash 30 g 0   meclizine  (ANTIVERT ) 25 MG tablet Take 1 tablet (25 mg total) by mouth 3 (three) times daily as needed for dizziness. 30 tablet 0   rosuvastatin  (CRESTOR ) 10 MG tablet Take 1 tablet (10 mg total) by mouth daily. For cholesterol 90 tablet 0   No current facility-administered medications on file prior to visit.   Allergies: Allergies  Allergen Reactions   Codeine Nausea And Vomiting    Skin on fire, Hot Flashes    Flovent  Diskus [Fluticasone  Furoate] Other (See Comments)    Irritation ofthroat   Augmentin  [Amoxicillin -Pot Clavulanate] Diarrhea   Contrave  [Naltrexone -Bupropion  Hcl Er] Palpitations   Qsymia  [Phentermine -Topiramate  Er] Other (See  Comments)    Tingling and muscle tightness   Physical Exam: VS:  Pulse 68   Ht 5' 7 (1.702 m)   Wt 215 lb 9.6 oz (97.8 kg)   LMP 11/01/2018 (Within Weeks) Comment: perimenopausal  SpO2 98%   BMI 33.77 kg/m , BMI Body mass index is 33.77 kg/m.  Wt Readings from Last 3 Encounters:  08/02/24 215 lb 9.6 oz (97.8 kg)  07/26/24 213 lb (96.6 kg)  04/27/24 214 lb 6 oz (97.2 kg)    General: Patient appears comfortable at rest. HEENT: Conjunctiva and lids normal, oropharynx clear with moist mucosa. Neck: Supple, no elevated JVP or carotid bruits, no thyromegaly. Lungs: Clear to auscultation, nonlabored breathing at rest. Cardiac: Regular rate and rhythm, no S3 or significant systolic murmur, no pericardial rub. Abdomen: Soft, nontender, no hepatomegaly, bowel sounds present, no guarding or rebound. Extremities: No pitting edema, distal pulses 2+. Skin: Warm and dry. Musculoskeletal: No kyphosis. Neuropsychiatric: Alert and oriented x3, affect grossly appropriate.  ECG:  An ECG dated 05/31/2021 was personally reviewed today and demonstrated:  Sinus rhythm with PVC.  Labwork: 04/27/2024: BUN 11; Creat 0.84; Hemoglobin 14.1; Platelets 164; Potassium 3.6; Sodium 141 06/08/2024: ALT 26; AST 30     Component Value Date/Time   CHOL 151 06/08/2024 0832   TRIG 101 06/08/2024 0832   HDL 49 06/08/2024 0832   CHOLHDL 3.1 06/08/2024 0832   CHOLHDL 4.2 10/17/2013 0750   VLDL 15 10/17/2013 0750   LDLCALC 83 06/08/2024 0832   Other Studies Reviewed Today:  Coronary calcium  score 03/18/2024: FINDINGS: Non-cardiac: No significant non cardiac findings on limited lung and soft tissue windows. See separate report from [facility].   Ascending Aorta: Normal diameter 2.8 cm   Pericardium: Normal   Coronary arteries: Calcium  noted in LAD and RCA   LM 0   LCX 0   LAD 1.33   RCA 51.5   Total: 52.8   IMPRESSION: Coronary calcium  score of 52.8. This was 95 th percentile for  age and sex matched control.  Assessment and Plan:  1.  Coronary calcium  score of 52.8.  2.  History of occasional PVCs by cardiac monitor in 2022 (approximately 2% total burden).  3.  Mixed hyperlipidemia, LDL 83 and HDL 49 as of October.  4.  Tobacco abuse.  Disposition:  Follow up {follow up:15908}  Signed, Jayson JUDITHANN Sierras, M.D., F.A.C.C. [facility] at [facility]

## 2024-08-15 ENCOUNTER — Ambulatory Visit: Payer: Self-pay

## 2024-08-15 NOTE — Telephone Encounter (Signed)
 FYI Only or Action Required?: FYI only for provider: appointment scheduled on 08/16/24, no sooner appts available.  Patient was last seen in primary care on 07/26/2024 by Cook, Jayce G, DO.  Called Nurse Triage reporting Cough and Nasal Congestion.  Symptoms began several days ago.  Interventions attempted: OTC medications: Mucinex.  Symptoms are: unchanged.  Triage Disposition: See Physician Within 24 Hours (overriding See HCP Within 4 Hours (Or PCP Triage))  Patient/caregiver understands and will follow disposition?: Yes  Reason for Disposition  [1] SEVERE sinus pain (e.g., excruciating) AND [2] not improved 2 hours after pain medicine  Answer Assessment - Initial Assessment Questions Advised to come in for office visit today, no appts available until tomorrow morning. Advised to go to UC if symptoms worsen before appt.   1. LOCATION: Where does it hurt?      Sinus pressure in the head  2. ONSET: When did the sinus pain start?  (e.g., hours, days)      Saturday evening  3. SEVERITY: How bad is the pain?   (Scale 0-10; or none, mild, moderate or severe)     It's a lot of pressure  4. RECURRENT SYMPTOM: Have you ever had sinus problems before? If Yes, ask: When was the last time? and What happened that time?      States she recently had office visit for similar symptoms and was prescribed antibiotics, was feeling better then started to feel these symptoms on Saturday  5. NASAL CONGESTION: Is the nose blocked? If Yes, ask: Can you open it or must you breathe through your mouth?     No  6. NASAL DISCHARGE: Do you have discharge from your nose? If so ask, What color?     No  7. FEVER: Do you have a fever? If Yes, ask: What is it, how was it measured, and when did it start?      No  8. OTHER SYMPTOMS: Do you have any other symptoms? (e.g., sore throat, cough, earache, difficulty breathing)     Cough, chest tightness with wheezing(more than baseline),  Denies SOB  9. PREGNANCY: Is there any chance you are pregnant? When was your last menstrual period?     NA  Protocols used: Sinus Pain or Congestion-A-AH  Copied from CRM #[id number]. Topic: Clinical - Red Word Triage >> Aug 15, 2024 12:26 PM Sophia H wrote: Red Word that prompted transfer to Nurse Triage:  Patient states headache, head congestion/tightness in chest started yesterday, req appt with PCP

## 2024-08-16 ENCOUNTER — Ambulatory Visit (HOSPITAL_COMMUNITY)
Admission: RE | Admit: 2024-08-16 | Discharge: 2024-08-16 | Disposition: A | Discharge status: Home / Self Care | Source: Ambulatory Visit | Attending: Family Medicine | Admitting: Family Medicine

## 2024-08-16 ENCOUNTER — Encounter: Payer: Self-pay | Admitting: Family Medicine

## 2024-08-16 ENCOUNTER — Other Ambulatory Visit: Payer: Self-pay | Admitting: Family Medicine

## 2024-08-16 ENCOUNTER — Ambulatory Visit: Admitting: Family Medicine

## 2024-08-16 VITALS — BP 113/77 | HR 71 | Temp 97.5°F | Ht 67.0 in | Wt 215.2 lb

## 2024-08-16 DIAGNOSIS — R059 Cough, unspecified: Secondary | ICD-10-CM

## 2024-08-16 MED ORDER — LEVOFLOXACIN 750 MG PO TABS: 750.0000 mg | ORAL_TABLET | Freq: Every day | ORAL | 0 refills | Status: DC

## 2024-08-16 NOTE — Progress Notes (Signed)
 "  Subjective:  Patient ID: Brandy Hensley, female    DOB: 10-23-1970  Age: 53 y.o. MRN: [id number]  CC:   Chief Complaint  Patient presents with   Acute Visit    Cough head pressure , chest congestion and soreness since the weekend     HPI:  53 year old female presents for evaluation of the above.  Has COPD.  Recently treated for COPD exacerbation.  Reports that she improved and now worsened again. Reports symptoms started over the weekend. Reports head congestion/pressure, chest congestion and soreness. No fever. No relieving factors. Has not used her inhaler.   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   Tobacco use 02/04/2023   Cervical radiculopathy 02/13/2022   DOE (dyspnea on exertion) 12/22/2018   COPD GOLD II (barely)  but MZ/ smoker 12/22/2018   Irritable bowel syndrome with diarrhea 01/15/2017   Hyperlipidemia 07/05/2015   GERD (gastroesophageal reflux disease) 10/31/2013    Social Hx   Social History   Socioeconomic History   Marital status: Divorced    Spouse name: Not on file   Number of children: Not on file   Years of education: Not on file   Highest education level: Not on file  Occupational History   Not on file  Tobacco Use   Smoking status: Every Day    Current packs/day: 0.50    Average packs/day: 0.5 packs/day for 31.0 years (15.5 ttl pk-yrs)    Types: Cigarettes   Smokeless tobacco: Never  Vaping Use   Vaping status: Never Used  Substance and Sexual Activity   Alcohol use: No    Alcohol/week: 0.0 standard drinks of alcohol   Drug use: No   Sexual activity: Yes    Birth control/protection: Surgical  Other Topics Concern   Not on file  Social History Narrative   Not on file   Social Drivers of Health   Tobacco Use: High Risk (07/26/2024)   Patient History    Smoking Tobacco Use: Every Day    Smokeless Tobacco Use: Never    Passive  Exposure: Not on file  Financial Resource Strain: Not on file  Food Insecurity: Not on file  Transportation Needs: Not on file  Physical Activity: Not on file  Stress: Not on file  Social Connections: Not on file  Depression (PHQ2-9): Low Risk (08/16/2024)   Depression (PHQ2-9)    PHQ-2 Score: 3  Alcohol Screen: Not on file  Housing: Not on file  Utilities: Not on file  Health Literacy: Not on file    Review of Systems Per HPI  Objective:  BP 113/77   Pulse 71   Temp (!) 97.5 F (36.4 C)   Ht 5' 7 (1.702 m)   Wt 215 lb 3.2 oz (97.6 kg)   LMP 11/01/2018 Comment: perimenopausal  SpO2 99%   BMI 33.71 kg/m      08/16/2024   11:38 AM 08/02/2024   11:39 AM 07/26/2024    8:28 AM  BP/Weight  Systolic BP 113 110 103  Diastolic BP 77 64 75  Wt. (Lbs) 215.2 215.6 213  BMI 33.71 kg/m2 33.77 kg/m2 33.36 kg/m2    Physical Exam Vitals and nursing note reviewed.  Constitutional:      General: She is not in acute distress.    Appearance: Normal appearance.  HENT:     Head: Normocephalic and atraumatic.  Eyes:  General:        Right eye: No discharge.        Left eye: No discharge.     Conjunctiva/sclera: Conjunctivae normal.  Cardiovascular:     Rate and Rhythm: Normal rate and regular rhythm.  Pulmonary:     Effort: Pulmonary effort is normal.     Breath sounds: Normal breath sounds. No wheezing, rhonchi or rales.  Neurological:     Mental Status: She is alert.  Psychiatric:        Mood and Affect: Mood normal.        Behavior: Behavior normal.     Lab Results  Component Value Date   WBC 14.2 (H) 04/27/2024   HGB 14.1 04/27/2024   HCT 41.5 04/27/2024   PLT 164 04/27/2024   GLUCOSE 132 (H) 04/27/2024   CHOL 151 06/08/2024   TRIG 101 06/08/2024   HDL 49 06/08/2024   LDLCALC 83 06/08/2024   ALT 26 06/08/2024   AST 30 06/08/2024   NA 141 04/27/2024   K 3.6 04/27/2024   CL 104 04/27/2024   CREATININE 0.84 04/27/2024   BUN 11 04/27/2024   CO2 28  04/27/2024   TSH 1.050 01/29/2023     Assessment & Plan:  Cough, unspecified type Assessment & Plan: Chest xray concerning for pneumonia. Given recent antibiotic use, starting on Levaquin .  Orders: -     DG Chest 2 View  Other orders -     levoFLOXacin ; Take 1 tablet (750 mg total) by mouth daily.  Dispense: 7 tablet; Refill: 0    Follow-up:  Return if symptoms worsen or fail to improve.  Jacqulyn Ahle DO [facility] "

## 2024-08-16 NOTE — Patient Instructions (Signed)
 Chest xray today.  Use the albuterol .  I will call with results and send in medication accordingly.

## 2024-08-19 DIAGNOSIS — R059 Cough, unspecified: Secondary | ICD-10-CM | POA: Insufficient documentation

## 2024-08-19 NOTE — Assessment & Plan Note (Signed)
 Chest xray concerning for pneumonia. Given recent antibiotic use, starting on Levaquin .

## 2024-09-15 ENCOUNTER — Ambulatory Visit: Admitting: Family Medicine

## 2024-09-15 ENCOUNTER — Ambulatory Visit
Admission: EM | Admit: 2024-09-15 | Discharge: 2024-09-15 | Disposition: A | Discharge status: Home / Self Care | Attending: Nurse Practitioner | Admitting: Nurse Practitioner

## 2024-09-15 VITALS — BP 111/75 | HR 85 | Temp 98.4°F | Resp 20

## 2024-09-15 DIAGNOSIS — R0989 Other specified symptoms and signs involving the circulatory and respiratory systems: Secondary | ICD-10-CM | POA: Diagnosis not present

## 2024-09-15 DIAGNOSIS — J069 Acute upper respiratory infection, unspecified: Secondary | ICD-10-CM

## 2024-09-15 DIAGNOSIS — Z8709 Personal history of other diseases of the respiratory system: Secondary | ICD-10-CM

## 2024-09-15 LAB — POC COVID19/FLU A&B COMBO
Covid Antigen, POC: NEGATIVE
Influenza A Antigen, POC: NEGATIVE
Influenza B Antigen, POC: NEGATIVE

## 2024-09-15 MED ORDER — PREDNISONE 20 MG PO TABS: 40.0000 mg | ORAL_TABLET | Freq: Every day | ORAL | 0 refills | Status: AC

## 2024-09-15 MED ORDER — PROMETHAZINE-DM 6.25-15 MG/5ML PO SYRP: 5.0000 mL | ORAL_SOLUTION | Freq: Four times a day (QID) | ORAL | 0 refills | Status: AC | PRN

## 2024-09-15 MED ORDER — AZITHROMYCIN 250 MG PO TABS: 250.0000 mg | ORAL_TABLET | Freq: Every day | ORAL | 0 refills | Status: AC

## 2024-09-15 MED ORDER — AZELASTINE HCL 0.1 % NA SOLN: 2.0000 | Freq: Two times a day (BID) | NASAL | 0 refills | Status: AC

## 2024-09-15 NOTE — ED Triage Notes (Addendum)
 Pt reports headache, congestion, cough x 2 days. Chest congestion that started this morning. Pt has decline testing during triage.

## 2024-09-15 NOTE — ED Provider Notes (Signed)
 " [facility]    CSN: [id number] Arrival date & time: 09/15/24  0957      History   Chief Complaint No chief complaint on file.   HPI Brandy Hensley is a 54 y.o. female.   The history is provided by the patient.   Patient presents with a 2-day history of headache, chest congestion, and cough.  States chest congestion started this morning.  Denies fever, chills, ear drainage, wheezing, difficulty breathing, chest pain, abdominal pain, nausea, vomiting, diarrhea, or rash.  Patient states she has been taking over-the-counter cough and cold medications for her symptoms with minimal relief.  She denies any obvious close sick contacts.  Per review of the chart, patient recently treated with doxycycline  and Levaquin  last month, she does have underlying history of COPD.     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   Tobacco use 02/04/2023   Cervical radiculopathy 02/13/2022   DOE (dyspnea on exertion) 12/22/2018   COPD GOLD II (barely)  but MZ/ smoker 12/22/2018   Irritable bowel syndrome with diarrhea 01/15/2017   Hyperlipidemia 07/05/2015   GERD (gastroesophageal reflux disease) 10/31/2013    Past Surgical History:  Procedure Laterality Date   CHOLECYSTECTOMY  09/10/2012   Procedure: LAPAROSCOPIC CHOLECYSTECTOMY;  Surgeon: Oneil DELENA Budge, MD;  Location: [facility];  Service: General;  Laterality: N/A;   COLONOSCOPY  2005-2006   Dr.Rehman @[facility]    TUBAL LIGATION      OB History   No obstetric history on file.      Home Medications    Prior to Admission medications  Medication Sig Start Date End Date Taking? Authorizing Provider  acetaminophen  (TYLENOL ) 325 MG  tablet Take 650 mg by mouth every 6 (six) hours as needed.    [provider]  albuterol  (PROVENTIL ) (2.5 MG/3ML) 0.083% nebulizer solution Take 3 mLs (2.5 mg total) by nebulization every 6 (six) hours as needed for wheezing or shortness of breath. 12/30/23   Cook, Jayce G, DO  albuterol  (VENTOLIN  HFA) 108 (90 Base) MCG/ACT inhaler Inhale 2 puffs into the lungs every 4 (four) hours as needed. 12/27/23   Stuart Vernell Norris, PA-C  aspirin EC 81 MG tablet Take 81 mg by mouth daily. Swallow whole.    [provider]  cyclobenzaprine  (FLEXERIL ) 10 MG tablet Take 1 tablet (10 mg total) by mouth 3 (three) times daily as needed for muscle spasms. 02/13/22   Cook, Jayce G, DO  Famotidine  (PEPCID  PO) Take 10 mg by mouth daily as needed (Acid after eating dinner).    [provider]  ibuprofen (ADVIL) 200 MG tablet Take 200 mg by mouth every 6 (six) hours as needed.    [provider]  ketoconazole  (NIZORAL ) 2 % cream Apply 1 Application topically 2 (two) times daily. Prn rash 02/12/24   Mauro Elveria BROCKS, NP  meclizine  (ANTIVERT ) 25 MG tablet Take 1 tablet (25 mg total) by mouth 3 (three) times daily as needed for dizziness. 11/09/20   Waddell Catholic M, DO  rosuvastatin  (CRESTOR ) 10 MG tablet Take 1 tablet (10 mg total) by mouth daily. For cholesterol 07/08/24   Cook, Jayce G, DO    Family  History Family History  Problem Relation Age of Onset   Hypertension Mother    Osteoporosis Mother    COPD Mother        smoked   Heart attack Maternal Grandmother    Cancer Maternal Grandmother 85       pancreatic cancer   Heart disease Maternal Grandmother 72       MI/CABG   Emphysema Father        smoked   Lung cancer Father     Social History Social History[1]   Allergies   Codeine, Flovent  diskus [fluticasone  furoate], Augmentin  [amoxicillin -pot clavulanate], Contrave  [naltrexone -bupropion  hcl er], and Qsymia  [phentermine -topiramate  er]   Review of Systems Review  of Systems Per HPI  Physical Exam Triage Vital Signs ED Triage Vitals  Encounter Vitals Group     BP 09/15/24 1018 111/75     Girls Systolic BP Percentile --      Girls Diastolic BP Percentile --      Boys Systolic BP Percentile --      Boys Diastolic BP Percentile --      Pulse Rate 09/15/24 1018 85     Resp 09/15/24 1018 20     Temp 09/15/24 1018 98.4 F (36.9 C)     Temp src --      SpO2 09/15/24 1018 95 %     Weight --      Height --      Head Circumference --      Peak Flow --      Pain Score 09/15/24 1019 4     Pain Loc --      Pain Education --      Exclude from Growth Chart --    No data found.  Updated Vital Signs BP 111/75 (BP Location: Right Arm)   Pulse 85   Temp 98.4 F (36.9 C)   Resp 20   LMP 11/01/2018 Comment: perimenopausal  SpO2 95%   Visual Acuity Right Eye Distance:   Left Eye Distance:   Bilateral Distance:    Right Eye Near:   Left Eye Near:    Bilateral Near:     Physical Exam Vitals and nursing note reviewed.  Constitutional:      General: She is not in acute distress.    Appearance: Normal appearance. She is well-developed.  HENT:     Head: Normocephalic and atraumatic.     Right Ear: Tympanic membrane, ear canal and external ear normal.     Left Ear: Tympanic membrane, ear canal and external ear normal.     Nose: Congestion present.     Right Turbinates: Enlarged and swollen.     Left Turbinates: Enlarged and swollen.     Right Sinus: No maxillary sinus tenderness or frontal sinus tenderness.     Left Sinus: No maxillary sinus tenderness or frontal sinus tenderness.     Mouth/Throat:     Lips: Pink.     Mouth: Mucous membranes are moist.     Pharynx: Uvula midline. Postnasal drip present. No pharyngeal swelling, oropharyngeal exudate, posterior oropharyngeal erythema or uvula swelling.     Comments: Cobblestoning present to posterior oropharynx  Eyes:     Extraocular Movements: Extraocular movements intact.      Conjunctiva/sclera: Conjunctivae normal.     Pupils: Pupils are equal, round, and reactive to light.  Neck:     Thyroid : No thyromegaly.     Trachea: No tracheal deviation.  Cardiovascular:     Rate and Rhythm: Normal  rate and regular rhythm.     Pulses: Normal pulses.     Heart sounds: Normal heart sounds.  Pulmonary:     Effort: Pulmonary effort is normal. No respiratory distress.     Breath sounds: Normal breath sounds. No stridor. No wheezing, rhonchi or rales.  Abdominal:     General: Bowel sounds are normal.     Palpations: Abdomen is soft.     Tenderness: There is no abdominal tenderness.  Musculoskeletal:     Cervical back: Normal range of motion and neck supple.  Skin:    General: Skin is warm and dry.  Neurological:     General: No focal deficit present.     Mental Status: She is alert and oriented to person, place, and time.  Psychiatric:        Mood and Affect: Mood normal.        Behavior: Behavior normal.        Thought Content: Thought content normal.        Judgment: Judgment normal.      UC Treatments / Results  Labs (all labs ordered are listed, but only abnormal results are displayed) Labs Reviewed  POC COVID19/FLU A&B COMBO    EKG   Radiology No results found.  Procedures Procedures (including critical care time)  Medications Ordered in UC Medications - No data to display  Initial Impression / Assessment and Plan / UC Course  I have reviewed the triage vital signs and the nursing notes.  Pertinent labs & imaging results that were available during my care of the patient were reviewed by me and considered in my medical decision making (see chart for details).  COVID/flu test was negative.  On exam, the patient is well-appearing, she is in no acute distress, vital signs are stable.  Baseline O2 sats are at 95%.  Patient with underlying history of COPD, no active wheezing, shortness of breath, rales, or rhonchi noted on her exam today.  Will  provide symptomatic treatment with prednisone  40 mg for bronchial inflammation, fluticasone  50 mcg nasal spray, and Promethazine  DM for the cough.  Although symptoms are not consistent with a COPD exacerbation, and patient was recently treated with 2 antibiotics last month, we will treat with azithromycin  in the event symptoms worsen.  Patient advised to only start the medication if needed, given the upcoming weather that is suspected in the area.  Supportive care recommendations were provided and discussed with the patient to include fluids, rest, over-the-counter analgesics, use of normal saline nasal spray, and use of a humidifier during sleep.  Discussed indications with the patient regarding follow-up.  Patient was in agreement with this plan of care and verbalizes understanding.  All questions were answered.  Patient stable for discharge.   Final Clinical Impressions(s) / UC Diagnoses   Final diagnoses:  Upper respiratory symptom   Discharge Instructions   None    ED Prescriptions   None    PDMP not reviewed this encounter.    [1]  Social History Tobacco Use   Smoking status: Every Day    Current packs/day: 0.50    Average packs/day: 0.5 packs/day for 31.0 years (15.5 ttl pk-yrs)    Types: Cigarettes   Smokeless tobacco: Never  Vaping Use   Vaping status: Never Used  Substance Use Topics   Alcohol use: No    Alcohol/week: 0.0 standard drinks of alcohol   Drug use: No     Gilmer Etta PARAS, NP 09/15/24 1110  "

## 2024-09-15 NOTE — Discharge Instructions (Addendum)
 The COVID/flu test is negative.  Symptoms are consistent with a viral upper respiratory infection.  You are not exhibiting symptoms of a COPD exacerbation at this time.  I have prescribed you an antibiotic only in the event that your symptoms worsen. Take medication as prescribed.  Continue use of your inhalers that you use at home for treatment of your COPD. Increase fluids and allow for plenty of rest. Recommend the use of normal saline nasal spray throughout the day for nasal congestion or runny nose. For your cough, recommend use of a humidifier in your bedroom at nighttime during sleep and sleeping elevated on pillows while symptoms persist. If symptoms fail to improve, recommend follow-up with your primary care physician for further evaluation. Follow-up as needed.

## 2024-09-22 ENCOUNTER — Ambulatory Visit: Admitting: Family Medicine

## 2024-09-22 VITALS — BP 115/78 | HR 82 | Temp 98.1°F | Ht 67.0 in | Wt 217.0 lb

## 2024-09-22 DIAGNOSIS — J019 Acute sinusitis, unspecified: Secondary | ICD-10-CM | POA: Insufficient documentation

## 2024-09-22 DIAGNOSIS — J0191 Acute recurrent sinusitis, unspecified: Secondary | ICD-10-CM | POA: Diagnosis not present

## 2024-09-22 MED ORDER — CEFDINIR 300 MG PO CAPS: 300.0000 mg | ORAL_CAPSULE | Freq: Two times a day (BID) | ORAL | 0 refills | Status: AC

## 2024-09-22 NOTE — Assessment & Plan Note (Signed)
Treating with Omnicef. °

## 2024-09-22 NOTE — Progress Notes (Signed)
 "  Subjective:  Patient ID: Brandy Hensley, female    DOB: 1970-12-11  Age: 54 y.o. MRN: [id number]  CC:   Chief Complaint  Patient presents with   follow up head congestion    On and off for months, went to UC received and took medications , is still feeling ear pressure and head dizziness    HPI:  54 year old female presents with respiratory symptoms.  Recently seen in urgent care on 1/22.  Continues to be symptomatic.  Reports a lot of sinus pain, pressure, and congestion.  She has had no resolution in her symptoms with recent treatment.  She was placed on azithromycin  and prednisone .  She has also used Astelin .  She recently stopped using this.  No fever.  She reports some associated dizziness and ear pressure.  No other associated symptoms.  No other complaints.  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   Tobacco use 02/04/2023   Cervical radiculopathy 02/13/2022   DOE (dyspnea on exertion) 12/22/2018   COPD GOLD II (barely)  but MZ/ smoker 12/22/2018   Irritable bowel syndrome with diarrhea 01/15/2017   Hyperlipidemia 07/05/2015   GERD (gastroesophageal reflux disease) 10/31/2013    Social Hx   Social History   Socioeconomic History   Marital status: Divorced    Spouse name: Not on file   Number of children: Not on file   Years of education: Not on file   Highest education level: Not on file  Occupational History   Not on file  Tobacco Use   Smoking status: Every Day    Current packs/day: 0.50    Average packs/day: 0.5 packs/day for 31.0 years (15.5 ttl pk-yrs)    Types: Cigarettes   Smokeless tobacco: Never  Vaping Use   Vaping status: Never Used  Substance and Sexual Activity   Alcohol use: No    Alcohol/week: 0.0 standard drinks of alcohol   Drug use: No   Sexual activity: Yes    Birth control/protection: Surgical  Other Topics Concern   Not  on file  Social History Narrative   Not on file   Social Drivers of Health   Tobacco Use: High Risk (09/15/2024)   Patient History    Smoking Tobacco Use: Every Day    Smokeless Tobacco Use: Never    Passive Exposure: Not on file  Financial Resource Strain: Not on file  Food Insecurity: Not on file  Transportation Needs: Not on file  Physical Activity: Not on file  Stress: Not on file  Social Connections: Not on file  Depression (PHQ2-9): Low Risk (09/22/2024)   Depression (PHQ2-9)    PHQ-2 Score: 3  Alcohol Screen: Not on file  Housing: Not on file  Utilities: Not on file  Health Literacy: Not on file    Review of Systems Per HPI  Objective:  BP 115/78   Pulse 82   Temp 98.1 F (36.7 C)   Ht 5' 7 (1.702 m)   Wt 217 lb (98.4 kg)   LMP 11/01/2018 Comment: perimenopausal  SpO2 100%   BMI 33.99 kg/m      09/22/2024   10:38 AM 09/15/2024   10:18 AM 08/16/2024   11:38 AM  BP/Weight  Systolic BP 115 111 113  Diastolic BP 78 75 77  Wt. (Lbs) 217  215.2  BMI 33.99 kg/m2  33.71 kg/m2    Physical Exam Vitals  and nursing note reviewed.  Constitutional:      General: She is not in acute distress. HENT:     Head: Normocephalic and atraumatic.     Right Ear: Tympanic membrane normal.     Left Ear: Tympanic membrane normal.     Nose: Congestion present.  Cardiovascular:     Rate and Rhythm: Normal rate and regular rhythm.  Pulmonary:     Effort: Pulmonary effort is normal.     Breath sounds: Normal breath sounds. No wheezing or rales.  Neurological:     Mental Status: She is alert.     Lab Results  Component Value Date   WBC 14.2 (H) 04/27/2024   HGB 14.1 04/27/2024   HCT 41.5 04/27/2024   PLT 164 04/27/2024   GLUCOSE 132 (H) 04/27/2024   CHOL 151 06/08/2024   TRIG 101 06/08/2024   HDL 49 06/08/2024   LDLCALC 83 06/08/2024   ALT 26 06/08/2024   AST 30 06/08/2024   NA 141 04/27/2024   K 3.6 04/27/2024   CL 104 04/27/2024   CREATININE 0.84  04/27/2024   BUN 11 04/27/2024   CO2 28 04/27/2024   TSH 1.050 01/29/2023     Assessment & Plan:  Acute recurrent sinusitis, unspecified location Assessment & Plan: Treating with Omnicef .  Orders: -     Cefdinir ; Take 1 capsule (300 mg total) by mouth 2 (two) times daily.  Dispense: 20 capsule; Refill: 0    Follow-up:  Return if symptoms worsen or fail to improve.  Jacqulyn Ahle DO [facility] "

## 2024-09-22 NOTE — Patient Instructions (Signed)
 Medication as prescribed.  If continues to persist, recommend referral to ENT.  Take care  Dr. Bluford

## 2024-09-26 ENCOUNTER — Other Ambulatory Visit: Payer: Self-pay | Admitting: Family Medicine
